# Patient Record
Sex: Female | Born: 1940
Health system: Southern US, Community
[De-identification: ages and names within clinical notes are randomized; demographics above are authoritative.]

## PROBLEM LIST (undated history)

## (undated) DIAGNOSIS — R1011 Right upper quadrant pain: Secondary | ICD-10-CM

## (undated) DIAGNOSIS — N3010 Interstitial cystitis (chronic) without hematuria: Secondary | ICD-10-CM

## (undated) DIAGNOSIS — F32A Depression, unspecified: Secondary | ICD-10-CM

## (undated) DIAGNOSIS — F419 Anxiety disorder, unspecified: Secondary | ICD-10-CM

## (undated) DIAGNOSIS — F329 Major depressive disorder, single episode, unspecified: Secondary | ICD-10-CM

## (undated) DIAGNOSIS — B029 Zoster without complications: Secondary | ICD-10-CM

## (undated) DIAGNOSIS — M7060 Trochanteric bursitis, unspecified hip: Secondary | ICD-10-CM

## (undated) DIAGNOSIS — F4320 Adjustment disorder, unspecified: Secondary | ICD-10-CM

## (undated) DIAGNOSIS — K645 Perianal venous thrombosis: Secondary | ICD-10-CM

## (undated) DIAGNOSIS — G20 Parkinson's disease: Secondary | ICD-10-CM

## (undated) DIAGNOSIS — K224 Dyskinesia of esophagus: Secondary | ICD-10-CM

## (undated) DIAGNOSIS — G20A1 Parkinson's disease without dyskinesia, without mention of fluctuations: Secondary | ICD-10-CM

## (undated) DIAGNOSIS — M797 Fibromyalgia: Secondary | ICD-10-CM

## (undated) DIAGNOSIS — N760 Acute vaginitis: Secondary | ICD-10-CM

## (undated) DIAGNOSIS — F4321 Adjustment disorder with depressed mood: Secondary | ICD-10-CM

## (undated) DIAGNOSIS — R269 Unspecified abnormalities of gait and mobility: Secondary | ICD-10-CM

## (undated) DIAGNOSIS — M3500 Sicca syndrome, unspecified: Secondary | ICD-10-CM

## (undated) DIAGNOSIS — K219 Gastro-esophageal reflux disease without esophagitis: Secondary | ICD-10-CM

## (undated) DIAGNOSIS — K589 Irritable bowel syndrome without diarrhea: Secondary | ICD-10-CM

## (undated) DIAGNOSIS — G2581 Restless legs syndrome: Secondary | ICD-10-CM

## (undated) HISTORY — DX: Acute vaginitis: N76.0

## (undated) HISTORY — DX: Adjustment disorder with depressed mood: F43.21

## (undated) HISTORY — DX: Irritable bowel syndrome, unspecified: K58.9

## (undated) HISTORY — DX: Restless legs syndrome: G25.81

## (undated) HISTORY — DX: Anxiety disorder, unspecified: F41.9

## (undated) HISTORY — DX: Adjustment disorder, unspecified: F43.20

## (undated) HISTORY — DX: Depression, unspecified: F32.A

## (undated) HISTORY — DX: Interstitial cystitis (chronic) without hematuria: N30.10

## (undated) HISTORY — DX: Trochanteric bursitis, unspecified hip: M70.60

## (undated) HISTORY — DX: Sjogren syndrome, unspecified: M35.00

## (undated) HISTORY — DX: Right upper quadrant pain: R10.11

## (undated) HISTORY — DX: Perianal venous thrombosis: K64.5

## (undated) HISTORY — DX: Unspecified abnormalities of gait and mobility: R26.9

## (undated) HISTORY — DX: Dyskinesia of esophagus: K22.4

## (undated) HISTORY — DX: Gastro-esophageal reflux disease without esophagitis: K21.9

## (undated) HISTORY — DX: Fibromyalgia: M79.7

## (undated) HISTORY — DX: Major depressive disorder, single episode, unspecified: F32.9

---
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
## 1969-09-30 HISTORY — PX: EXPLORATORY LAPAROTOMY: SUR591

## 1969-11-28 HISTORY — PX: APPENDECTOMY: SHX54

## 1972-03-30 HISTORY — PX: POLYPECTOMY: SHX149

## 1976-11-28 HISTORY — PX: NASAL SEPTUM SURGERY: SHX37

## 1978-08-30 HISTORY — PX: CYSTOCELE REPAIR: SHX163

## 1978-08-30 HISTORY — PX: HERNIA REPAIR: SHX51

## 1978-08-30 HISTORY — PX: RECTOCELE REPAIR: SHX761

## 1993-04-24 ENCOUNTER — Encounter: Payer: Self-pay | Admitting: Internal Medicine

## 1999-12-23 ENCOUNTER — Encounter (INDEPENDENT_AMBULATORY_CARE_PROVIDER_SITE_OTHER): Payer: Self-pay | Admitting: Specialist

## 1999-12-23 ENCOUNTER — Encounter: Payer: Self-pay | Admitting: Gastroenterology

## 1999-12-23 ENCOUNTER — Other Ambulatory Visit: Admission: RE | Admit: 1999-12-23 | Discharge: 1999-12-23 | Payer: Self-pay | Admitting: Gastroenterology

## 2001-01-28 HISTORY — PX: ESOPHAGOGASTRODUODENOSCOPY: SHX1529

## 2001-06-04 ENCOUNTER — Emergency Department (HOSPITAL_COMMUNITY): Admission: EM | Admit: 2001-06-04 | Discharge: 2001-06-04 | Payer: Self-pay | Admitting: Emergency Medicine

## 2002-10-29 HISTORY — PX: ABDOMINAL HYSTERECTOMY: SHX81

## 2004-07-09 ENCOUNTER — Ambulatory Visit: Payer: Self-pay | Admitting: Family Medicine

## 2004-09-10 ENCOUNTER — Ambulatory Visit: Payer: Self-pay | Admitting: Internal Medicine

## 2005-03-18 ENCOUNTER — Ambulatory Visit: Payer: Self-pay | Admitting: Internal Medicine

## 2005-03-24 ENCOUNTER — Ambulatory Visit: Payer: Self-pay | Admitting: Internal Medicine

## 2005-03-25 ENCOUNTER — Ambulatory Visit (HOSPITAL_COMMUNITY): Admission: RE | Admit: 2005-03-25 | Discharge: 2005-03-25 | Payer: Self-pay | Admitting: Internal Medicine

## 2005-03-25 ENCOUNTER — Encounter: Payer: Self-pay | Admitting: Internal Medicine

## 2005-04-13 ENCOUNTER — Ambulatory Visit: Payer: Self-pay | Admitting: Internal Medicine

## 2005-06-25 ENCOUNTER — Ambulatory Visit: Payer: Self-pay | Admitting: Internal Medicine

## 2005-09-01 ENCOUNTER — Ambulatory Visit: Payer: Self-pay | Admitting: Internal Medicine

## 2005-09-16 ENCOUNTER — Ambulatory Visit: Payer: Self-pay | Admitting: Internal Medicine

## 2006-03-18 ENCOUNTER — Ambulatory Visit: Payer: Self-pay | Admitting: Internal Medicine

## 2006-07-05 ENCOUNTER — Ambulatory Visit: Payer: Self-pay | Admitting: Internal Medicine

## 2006-09-23 ENCOUNTER — Ambulatory Visit: Payer: Self-pay | Admitting: Internal Medicine

## 2006-09-23 LAB — CONVERTED CEMR LAB
Basophils Relative: 1 % (ref 0–1)
Eosinophils Absolute: 0.1 10*3/uL (ref 0.0–0.7)
Eosinophils Relative: 1 % (ref 0–5)
Glucose, Bld: 66 mg/dL — ABNORMAL LOW (ref 70–99)
HCT: 39.3 % (ref 36.0–46.0)
Hemoglobin: 12.6 g/dL (ref 12.0–15.0)
Lymphocytes Relative: 43 % (ref 12–46)
Lymphs Abs: 2.2 10*3/uL (ref 0.7–3.3)
MCHC: 32.1 g/dL (ref 30.0–36.0)
MCV: 96.6 fL (ref 78.0–100.0)
Neutro Abs: 2.4 10*3/uL (ref 1.7–7.7)
Platelets: 288 10*3/uL (ref 150–400)
RBC: 4.07 M/uL (ref 3.87–5.11)
WBC: 5.1 10*3/uL (ref 4.0–10.5)

## 2006-09-30 HISTORY — PX: ESOPHAGOGASTRODUODENOSCOPY: SHX1529

## 2006-10-10 ENCOUNTER — Ambulatory Visit: Payer: Self-pay | Admitting: Internal Medicine

## 2006-10-13 ENCOUNTER — Ambulatory Visit: Payer: Self-pay | Admitting: Internal Medicine

## 2006-10-14 ENCOUNTER — Ambulatory Visit: Payer: Self-pay | Admitting: Internal Medicine

## 2006-10-21 ENCOUNTER — Ambulatory Visit: Payer: Self-pay | Admitting: Internal Medicine

## 2006-11-08 ENCOUNTER — Ambulatory Visit: Payer: Self-pay | Admitting: Family Medicine

## 2006-11-24 ENCOUNTER — Ambulatory Visit: Payer: Self-pay | Admitting: Family Medicine

## 2007-02-07 ENCOUNTER — Encounter: Payer: Self-pay | Admitting: Internal Medicine

## 2007-02-09 ENCOUNTER — Encounter (INDEPENDENT_AMBULATORY_CARE_PROVIDER_SITE_OTHER): Payer: Self-pay | Admitting: *Deleted

## 2007-02-16 DIAGNOSIS — F411 Generalized anxiety disorder: Secondary | ICD-10-CM | POA: Insufficient documentation

## 2007-02-16 DIAGNOSIS — M790 Rheumatism, unspecified: Secondary | ICD-10-CM | POA: Insufficient documentation

## 2007-02-16 DIAGNOSIS — F329 Major depressive disorder, single episode, unspecified: Secondary | ICD-10-CM

## 2007-02-16 DIAGNOSIS — F3289 Other specified depressive episodes: Secondary | ICD-10-CM | POA: Insufficient documentation

## 2007-02-16 DIAGNOSIS — K219 Gastro-esophageal reflux disease without esophagitis: Secondary | ICD-10-CM | POA: Insufficient documentation

## 2007-02-16 DIAGNOSIS — M797 Fibromyalgia: Secondary | ICD-10-CM

## 2007-02-16 DIAGNOSIS — N3010 Interstitial cystitis (chronic) without hematuria: Secondary | ICD-10-CM | POA: Insufficient documentation

## 2007-02-16 DIAGNOSIS — J309 Allergic rhinitis, unspecified: Secondary | ICD-10-CM | POA: Insufficient documentation

## 2007-02-16 DIAGNOSIS — M3500 Sicca syndrome, unspecified: Secondary | ICD-10-CM | POA: Insufficient documentation

## 2007-02-17 ENCOUNTER — Encounter (INDEPENDENT_AMBULATORY_CARE_PROVIDER_SITE_OTHER): Payer: Self-pay | Admitting: *Deleted

## 2007-02-19 DIAGNOSIS — G2581 Restless legs syndrome: Secondary | ICD-10-CM | POA: Insufficient documentation

## 2007-02-19 DIAGNOSIS — K589 Irritable bowel syndrome without diarrhea: Secondary | ICD-10-CM | POA: Insufficient documentation

## 2007-03-10 ENCOUNTER — Ambulatory Visit: Payer: Self-pay | Admitting: Internal Medicine

## 2007-03-10 DIAGNOSIS — R5383 Other fatigue: Secondary | ICD-10-CM

## 2007-03-10 DIAGNOSIS — R5381 Other malaise: Secondary | ICD-10-CM | POA: Insufficient documentation

## 2007-03-13 LAB — CONVERTED CEMR LAB
Albumin: 4 g/dL (ref 3.5–5.2)
Basophils Absolute: 0 10*3/uL (ref 0.0–0.1)
Basophils Relative: 1 % (ref 0–1)
CO2: 25 meq/L (ref 19–32)
Chloride: 107 meq/L (ref 96–112)
Creatinine, Ser: 0.76 mg/dL (ref 0.40–1.20)
Glucose, Bld: 77 mg/dL (ref 70–99)
HCT: 38.5 % (ref 36.0–46.0)
Lymphocytes Relative: 36 % (ref 12–46)
Lymphs Abs: 2 10*3/uL (ref 0.7–3.3)
MCV: 97.7 fL (ref 78.0–100.0)
Monocytes Relative: 8 % (ref 3–11)
Potassium: 4.7 meq/L (ref 3.5–5.3)
Sodium: 141 meq/L (ref 135–145)
Total Bilirubin: 0.5 mg/dL (ref 0.3–1.2)
WBC: 5.5 10*3/uL (ref 4.0–10.5)

## 2007-04-21 ENCOUNTER — Telehealth (INDEPENDENT_AMBULATORY_CARE_PROVIDER_SITE_OTHER): Payer: Self-pay | Admitting: *Deleted

## 2007-05-04 ENCOUNTER — Telehealth (INDEPENDENT_AMBULATORY_CARE_PROVIDER_SITE_OTHER): Payer: Self-pay | Admitting: *Deleted

## 2007-05-19 ENCOUNTER — Telehealth (INDEPENDENT_AMBULATORY_CARE_PROVIDER_SITE_OTHER): Payer: Self-pay | Admitting: *Deleted

## 2007-06-16 ENCOUNTER — Ambulatory Visit: Payer: Self-pay | Admitting: Internal Medicine

## 2007-07-03 ENCOUNTER — Telehealth (INDEPENDENT_AMBULATORY_CARE_PROVIDER_SITE_OTHER): Payer: Self-pay | Admitting: *Deleted

## 2008-03-15 ENCOUNTER — Encounter: Payer: Self-pay | Admitting: Internal Medicine

## 2008-03-18 ENCOUNTER — Encounter (INDEPENDENT_AMBULATORY_CARE_PROVIDER_SITE_OTHER): Payer: Self-pay | Admitting: *Deleted

## 2008-03-20 ENCOUNTER — Telehealth: Payer: Self-pay | Admitting: Internal Medicine

## 2008-03-22 ENCOUNTER — Ambulatory Visit: Payer: Self-pay | Admitting: Internal Medicine

## 2008-03-22 DIAGNOSIS — R198 Other specified symptoms and signs involving the digestive system and abdomen: Secondary | ICD-10-CM | POA: Insufficient documentation

## 2008-03-22 DIAGNOSIS — K645 Perianal venous thrombosis: Secondary | ICD-10-CM | POA: Insufficient documentation

## 2008-04-01 ENCOUNTER — Ambulatory Visit: Payer: Self-pay | Admitting: Internal Medicine

## 2008-04-02 ENCOUNTER — Telehealth (INDEPENDENT_AMBULATORY_CARE_PROVIDER_SITE_OTHER): Payer: Self-pay | Admitting: *Deleted

## 2008-04-15 ENCOUNTER — Telehealth: Payer: Self-pay | Admitting: Internal Medicine

## 2008-04-30 ENCOUNTER — Encounter: Payer: Self-pay | Admitting: Internal Medicine

## 2008-05-30 ENCOUNTER — Ambulatory Visit: Payer: Self-pay | Admitting: Internal Medicine

## 2008-05-30 DIAGNOSIS — F4321 Adjustment disorder with depressed mood: Secondary | ICD-10-CM | POA: Insufficient documentation

## 2008-05-30 DIAGNOSIS — N760 Acute vaginitis: Secondary | ICD-10-CM | POA: Insufficient documentation

## 2008-05-30 DIAGNOSIS — M810 Age-related osteoporosis without current pathological fracture: Secondary | ICD-10-CM | POA: Insufficient documentation

## 2008-06-21 ENCOUNTER — Ambulatory Visit: Payer: Self-pay | Admitting: Family Medicine

## 2008-06-21 DIAGNOSIS — M79609 Pain in unspecified limb: Secondary | ICD-10-CM | POA: Insufficient documentation

## 2008-06-21 DIAGNOSIS — R1011 Right upper quadrant pain: Secondary | ICD-10-CM | POA: Insufficient documentation

## 2008-06-21 DIAGNOSIS — R269 Unspecified abnormalities of gait and mobility: Secondary | ICD-10-CM | POA: Insufficient documentation

## 2008-06-21 DIAGNOSIS — M76899 Other specified enthesopathies of unspecified lower limb, excluding foot: Secondary | ICD-10-CM | POA: Insufficient documentation

## 2008-06-25 ENCOUNTER — Encounter: Payer: Self-pay | Admitting: Family Medicine

## 2008-06-25 ENCOUNTER — Encounter: Admission: RE | Admit: 2008-06-25 | Discharge: 2008-06-25 | Payer: Self-pay | Admitting: Family Medicine

## 2008-07-02 ENCOUNTER — Ambulatory Visit: Payer: Self-pay | Admitting: Family Medicine

## 2008-07-22 ENCOUNTER — Encounter: Payer: Self-pay | Admitting: Family Medicine

## 2008-07-23 ENCOUNTER — Encounter: Payer: Self-pay | Admitting: Family Medicine

## 2008-07-30 ENCOUNTER — Encounter: Payer: Self-pay | Admitting: Family Medicine

## 2008-10-01 ENCOUNTER — Encounter: Payer: Self-pay | Admitting: Cardiovascular Disease

## 2008-10-01 ENCOUNTER — Encounter: Payer: Self-pay | Admitting: Internal Medicine

## 2008-10-01 ENCOUNTER — Ambulatory Visit: Payer: Self-pay | Admitting: Adult Health Nurse Practitioner

## 2008-10-02 ENCOUNTER — Encounter: Payer: Self-pay | Admitting: Internal Medicine

## 2008-10-07 ENCOUNTER — Ambulatory Visit: Payer: Self-pay | Admitting: Cardiovascular Disease

## 2008-10-17 ENCOUNTER — Ambulatory Visit: Payer: Self-pay

## 2008-10-17 ENCOUNTER — Encounter: Payer: Self-pay | Admitting: Cardiovascular Disease

## 2008-10-28 ENCOUNTER — Ambulatory Visit: Payer: Self-pay | Admitting: Cardiovascular Disease

## 2009-02-03 DIAGNOSIS — K224 Dyskinesia of esophagus: Secondary | ICD-10-CM | POA: Insufficient documentation

## 2009-02-03 DIAGNOSIS — IMO0001 Reserved for inherently not codable concepts without codable children: Secondary | ICD-10-CM | POA: Insufficient documentation

## 2009-06-10 ENCOUNTER — Encounter: Payer: Self-pay | Admitting: Internal Medicine

## 2009-07-21 ENCOUNTER — Ambulatory Visit: Payer: Self-pay | Admitting: Internal Medicine

## 2009-07-21 DIAGNOSIS — R1313 Dysphagia, pharyngeal phase: Secondary | ICD-10-CM | POA: Insufficient documentation

## 2009-08-05 ENCOUNTER — Ambulatory Visit (HOSPITAL_COMMUNITY): Admission: RE | Admit: 2009-08-05 | Discharge: 2009-08-05 | Payer: Self-pay | Admitting: Internal Medicine

## 2009-12-22 ENCOUNTER — Encounter (INDEPENDENT_AMBULATORY_CARE_PROVIDER_SITE_OTHER): Payer: Self-pay | Admitting: *Deleted

## 2010-01-07 ENCOUNTER — Telehealth: Payer: Self-pay | Admitting: Internal Medicine

## 2010-01-16 ENCOUNTER — Ambulatory Visit: Payer: Self-pay | Admitting: *Deleted

## 2010-01-21 ENCOUNTER — Ambulatory Visit: Payer: Self-pay | Admitting: *Deleted

## 2010-09-27 LAB — CONVERTED CEMR LAB
BUN: 14 mg/dL (ref 6–23)
CO2: 29 meq/L (ref 19–32)
Glucose, Bld: 69 mg/dL — ABNORMAL LOW (ref 70–99)
Potassium: 4.3 meq/L (ref 3.5–5.3)
Sodium: 141 meq/L (ref 135–145)

## 2010-10-01 NOTE — Letter (Signed)
Summary: Colonoscopy Letter -corrected see append  [facility]  [address] [place], [place] [address]   Phone: [phone]  Fax: [phone]      December 22, 2009 MRN: [id number]   JOLEAN MADARIAGA [address] [place], [place]  [address]   Dear Ms. Cabiness,   According to your medical record, it is time for you to schedule a Colonoscopy. The American Cancer Society recommends this procedure as a method to detect early colon cancer. Patients with a family history of colon cancer, or a personal history of colon polyps or inflammatory bowel disease are at increased risk.  This letter has beeen generated based on the recommendations made at the time of your procedure. If you feel that in your particular situation this may no longer apply, please contact our office.  Please call our office at [phone] to schedule this appointment or to update your records at your earliest convenience.  Thank you for cooperating with [place] to provide you with the very best care possible.   Sincerely,  Iva Boop, M.D.  [facility] [phone]  Appended Document: Colonoscopy Letter -corrected see append This was sent in error had colonscopy 2009, due 2019

## 2010-10-01 NOTE — Progress Notes (Signed)
Summary: Triage   Phone Note Call from Patient Call back at Home Phone [phone]   Caller: Patient Call For: Dr. Leone Payor Reason for Call: Talk to Nurse Summary of Call: pt. received a recall colon letter. Her last colon was 2009....wants to know when she will be due for next colonoscopy? Initial call taken by: Karna Christmas,  Jan 07, 2010 4:06 PM  Follow-up for Phone Call        Dr Leone Payor last colon was 04/28/08.  Report indicates a 10 year recall.  Please review and advise if this letter was sent out mistakenly.   Follow-up by: Darcey Nora RN, CGRN,  Jan 07, 2010 4:13 PM  Additional Follow-up for Phone Call Additional follow up Details #1::        letter was mistakenly sent as I must not have seen EMR record please explain she is not due for colonoscopy Additional Follow-up by: Iva Boop MD, Clementeen Graham,  Jan 08, 2010 8:54 AM    Additional Follow-up for Phone Call Additional follow up Details #2::    I have left the patient a message that this was sent mistakenly.  She is due for a repeat colon 03/2018 this is correct in [facility].  I have asked her to call us for any new GI symptoms, rectal bleeding or change in bowel habits.   Follow-up by: Darcey Nora RN, CGRN,  Jan 08, 2010 10:57 AM

## 2010-12-08 ENCOUNTER — Encounter: Payer: Self-pay | Admitting: Neurology

## 2010-12-29 ENCOUNTER — Encounter: Payer: Self-pay | Admitting: Neurology

## 2011-01-12 NOTE — Assessment & Plan Note (Signed)
[facility] OFFICE NOTE   NAME:Kaitlyn Church, Kaitlyn Church                    MRN:          [id number]  DATE:10/28/2008                            DOB:          1940-12-13    PRIMARY CARE PHYSICIAN:  [name], MD   HISTORY OF PRESENT ILLNESS:  Ms. Lotspeich is a pleasant 70 year old  Caucasian female with the past medical history significant for  fibromyalgia, osteoarthritis, osteoporosis, seasonal allergies, and  esophageal spasm who is referred to my office initially on October 07, 2008, for further evaluation of atypical chest pain that is associated  with shortness of breath.  The patient had a set of cardiac enzymes  drawn in her primary care physician's office.  At that time, this showed  a CK of 158, CK-MB of 11.4, and troponin I that was less than 0.2.  Her  symptoms mostly sounded atypical.  She described her chest pain as  occurring when she attempted to swallow food.  She did not describe any  exertional chest pain.  She did note episodes of mild, sharp, stabbing  chest pain over the left chest wall that do not occur with exertion.  The symptoms lasted for 30 seconds and were not associated with  shortness of breath, palpitations, diaphoresis, nausea, or vomiting.  I  elected to perform a myocardial perfusion stress study and an  echocardiogram.  Her stress Myoview showed no evidence of myocardial  ischemia.  Her echocardiogram showed normal left ventricular size and  function with an ejection fraction of 60%.  There were no left  ventricular regional wall motion abnormalities and no significant  valvular abnormalities noted.   The patient returns today to review the results of her cardiovascular  testing.  She tells me that she has had no recurrence of the chest pain  that she had been having prior to this.  She does continue to have  occasional discomfort when she swallows food.  She tells me that the  workup for her esophageal problems was to be performed after her  cardiovascular issues were settled.  She notes no change in her  breathing pattern and denies any episodes of dizziness, near syncope,  syncope, orthopnea, PND, or lower extremity edema.  Her only real  complaint today is of stiff muscle in her left neck.  This seems to be  causing some discomfort in her neck when she moves her head.   CURRENT MEDICATIONS:  1. Potassium 99 mg once daily.  2. Zinc 50 mg once daily.  3. Garlic 500 mg once daily.  4. Vitamin D 400 units once daily.  5. Boron 3 mg once daily.  6. L-Glutamine 500 mg once daily.  7. Premarin 0.625 mg once daily.  8. Claritin once daily.  9. Xanax 0.25 mg half tablet once daily.   REVIEW OF SYSTEMS:  As stated in history of present illness is otherwise  negative.   PHYSICAL EXAMINATION:  VITAL SIGNS:  Blood pressure 99/56, pulse 70 and  regular, respirations 12 and unlabored.  GENERAL:  She is a pleasant middle-aged Caucasian female  in no acute  distress.  She is alert and oriented x3.  SKIN:  Warm and dry.  HEENT:  Normal.  There is what appears to be a spasm of her left  sternocleidomastoid muscle.  The patient has normal range of motion of  her head and neck.  NECK:  No JVD.  No carotid bruits.  No thyromegaly.  No lymphadenopathy.  LUNGS:  Clear to auscultation bilaterally without wheezes, rhonchi, or  crackles noted.  CARDIOVASCULAR:  Regular rate and rhythm without murmurs, gallops, or  rubs noted.  ABDOMEN:  Soft, nontender.  Bowel sounds are present.  EXTREMITIES:  No evidence of edema.   DIAGNOSTIC STUDIES:  1. Transthoracic echocardiogram performed on October 17, 2008, shows      normal left ventricular size and function with an ejection fraction      of 60%.  There were no left ventricular regional wall motion      abnormalities noted.  There was no pericardial effusion.  There      were no significant valvular abnormalities noted.  2.  Stress Myoview performed on October 17, 2008, with adenosine      infusion showed normal contractility and thickening in all areas of      the myocardia.  The overall left ventricular systolic function was      noted to be normal.  Ejection fraction was estimated 78%.  There      was no evidence of perfusion defects to suggest ischemia.  This was      read as a normal stress nuclear study.   ASSESSMENT AND PLAN:  This is a pleasant 70 year old Caucasian female  who was referred for atypical chest pain and had no evidence of  myocardial ischemia on her stress test.  Her echocardiogram is not  suggestive of any structural cardiac abnormalities.  I think that her  chest pain is most likely noncardiac in etiology.  I have encouraged the  patient to continue to follow up with her primary care physician for  possible gastrointestinal sources of her discomfort.  It is also likely  that the patient has a musculoskeletal component of her abdominal, chest  wall, and upper back pain.  We will follow her on an as-needed basis in  our office.  I will not make any medication changes today.     Verne Carrow, MD  Electronically Signed    CM/MedQ  DD: 10/28/2008  DT: 10/29/2008  Job #: [id number]   cc:   [name], MD

## 2011-01-12 NOTE — Assessment & Plan Note (Signed)
[facility] OFFICE NOTE   NAME:Kaitlyn Church                    MRN:          [id number]  DATE:10/07/2008                            DOB:          10/23/1940    PRIMARY CARE PHYSICIAN:  [name], MD   REASON FOR REFERRAL:  The patient with complaints of chest pain and  shortness of breath.   HISTORY OF PRESENT ILLNESS:  Kaitlyn Church is a pleasant 70 year old  Caucasian female with a past medical history significant for  fibromyalgia, osteoarthritis, osteoporosis, seasonal allergies, and  esophageal spasm who is referred today for further evaluation of  atypical chest pain with associated shortness of breath.  The patient  tells me that she has had chronic problems with chest heaviness.  The  most significant chest pain occurs after she attempts to swallow food.  She has been diagnosed in the past with esophageal spasm, but currently  is not being treated for this.  She tells me that lately when she  swallows food, she can feel her esophagus spasm around the food.  She  does not describe any exertional chest pain.  She does note several  episodes of mild sharp stabbing chest pain over the left chest wall that  have not occurred with exertion.  The symptoms usually last for 30  seconds and are not associated with increased shortness of breath,  palpitations, diaphoresis, nausea, or vomiting.  Overall, she tells me  that she feels that she has shortness of breath all the time.  This is  to the point that she feels that she becomes more short of breath when  she walks a minimal distance or even sometimes when she talks.  She  reports lower extremity swelling over the last few months as well.  She  denies any awareness of palpitations or any episodes of dizziness, near  syncope, syncope, orthopnea, or PND.  It appears that she had laboratory  values drawn in [name] office and her CK-MB came back elevated.   Her  overall CK level was within the normal range, but the CK-MB was 11.4.  A  troponin checked at the same time was less than 0.2, which on this  laboratory scale is within the normal range.   PAST MEDICAL HISTORY:  1. Fibromyalgia.  2. Osteoarthritis.  3. Osteoporosis.  4. Seasonal allergies.  5. Esophageal spasm.   PAST SURGICAL HISTORY:  1. Tonsillectomy.  2. Exploratory laparoscopy.  3. Appendectomy.  4. Removal of bladder polyps.  5. Hysterectomy.  6. Repair of the deviated nasal septum.  7. Hernia repair.  8. Cystocele and rectocele repair.   ALLERGIES:  DARVON, DEMEROL, MORPHINE.   CURRENT MEDICATIONS:  1. Potassium 99 mg once daily.  2. Zinc 50 mg once daily.  3. Garlic 500 mg once daily.  4. Vitamin D 400 units once daily.  5. Boron 3 mg once daily.  6. L-glutamine 500 mg once daily.  7. Premarin 0.625 mg once daily.   ALLERGIES:  1. RELIEF one-half tablet once daily.  2. ALPRAZOLAM 0.25 mg half tablet once daily.  SOCIAL HISTORY:  The patient denies use of tobacco, alcohol, or illicit  drugs.  She is a widow and works as a housewife.  She has 2 children.   FAMILY HISTORY:  The patient's mother died from breast cancer and her  father died from a pneumonia.  She does have an uncle who had a  myocardial infarction.  She has 1 sister who is alive with Alzheimer  disease and 1 brother who is alive who is paraplegic.  There is no  family history of premature coronary artery disease.   REVIEW OF SYSTEMS:  As stated in the history of present illness is  otherwise negative.   PHYSICAL EXAMINATION:  VITAL SIGNS:  Blood pressure 102/60, pulse 72 and  regular, respirations 12 and unlabored.  GENERAL:  She is a pleasant elderly Caucasian female in no acute  distress.  She is alert and oriented x3.  PSYCHIATRIC:  Mood and affect are appropriate.  NEUROLOGIC:  No focal neurological deficits.  MUSCULOSKELETAL:  Muscle strength and tone is normal.  SKIN:  Warm and  dry.  HEENT:  Normal.  The patient is wearing goggles as she tells me she has  dry eyes.  NECK:  No JVD.  No carotid bruits.  No thyromegaly.  No lymphadenopathy.  LUNGS:  Clear to auscultation bilaterally without wheezes, rhonchi, or  crackles noted.  CARDIOVASCULAR:  Regular rate and rhythm without murmurs, gallops, or  rubs noted.  ABDOMEN:  Soft, nontender, and nondistended.  Bowel sounds are present.  EXTREMITIES:  No evidence of lower extremity edema currently.  Pulses  are 2+ in all extremities.   DIAGNOSTIC STUDIES:  1. Laboratory values from the office of Dr. [name] shows a CK of 158, CK-      MB 11.4, troponin-I less than 0.2.  2. A 12-lead EKG obtained on October 01, 2008 in an outside office      shows normal sinus rhythm with poor R-wave progression throughout      the precordial leads.  There are no signs of active ischemia.  The      QT interval is corrected at 370 milliseconds.  The PR intervals are      112 milliseconds.  The QRS duration is 72 milliseconds.   ASSESSMENT AND PLAN:  This is a pleasant 70 year old Caucasian female  with no prior cardiac history and no significant risk factors for  coronary artery disease who presents with complaints of atypical chest  pain as well as baseline shortness of breath and complaints of lower  extremity swelling, although none is present on exam today.  I do not  think that her chest pain sounds consistent with a cardiac etiology.  It  sounds like it is most likely related to a gastrointestinal cause.  The  patient does, however, have an elevated CK-MB level in the setting of a  normal total CK level and a normal troponin.  I think it would be  worthwhile especially with her shortness of breath to perform an  echocardiogram to assess her left ventricular systolic function and to  look for a left ventricular regional wall motion abnormalities.  I would  also like to perform an adenosine Myoview stress test to rule out any   coronary ischemia.  I will not make any medication changes at the  current time.  I will plan on seeing the patient back in my office in 3  weeks to review the results of her testing.  The patient is aware that  she should seek immediate medical attention if she has any abrupt change  in her clinical status.     Verne Carrow, MD  Electronically Signed    CM/MedQ  DD: 10/07/2008  DT: 10/08/2008  Job #: [id number]   cc:   [name], MD

## 2011-01-15 NOTE — Assessment & Plan Note (Signed)
[facility]                         GASTROENTEROLOGY OFFICE NOTE   NAME:Weinman, CENDY OCONNOR                    MRN:          [id number]  DATE:10/10/2006                            DOB:          23-Aug-1941    CHIEF COMPLAINT:  Esophageal spasms, hoarseness.   ASSESSMENT:  A 70 year old white woman with known history of esophageal  dysmotility. She has a dry mouth syndrome that is not Sjogren's (has had  rheumatology evaluation). She has had problems lately with worsening  hoarseness after talking for 2 or 3 hours straight a few weeks ago.  Subsequently, she had bronchitis or an upper respiratory infection and  took a Z-pak. She is continuing to complain of an aching chest pain that  she relates to her esophagus, that was improved by use of valium.   I think she could have esophageal dysmotility related problems, though I  would expect more severe chest pain. She could have reflux problems. She  is tender on her chest wall and I think some of this is musculoskeletal  chest pain in the setting of fibromyalgia. The hoarseness could be due  to some sort of laryngeal strain as opposed to reflux or both.   The patient also suffers from chronic constipation, which is improved on  MiraLax. She had a colonoscopy in 2001, without significant findings.   PLAN:  1. After discussing this, we will go ahead with an upper GI endoscopy,      the last one was in 2002. If she did have changes of reflux      esophagitis I think that could help [place]. The unfortunate thing is      that she has sensitivities to almost all H2 blockers and PPIs I      think. Her therapy is quite limited due to that.  2. She can continue to use moist heat, perhaps a topical NSAID would      be useful as Tylenol and NSAIDs caused problems with making her      feel hyper. She recently took a baby aspirin daily for three days      and developed mouth ulcers. Therapy is significantly limited by  her      medication sensitivities.  3. Continue the MiraLax, screening colonoscopy in 2011.   Also, I think returning to an Ear, Nose and Throat physician for  examination of the larynx could prove useful. It does not sound like she  had a flexible laryngoscopy. I think she could have a laryngeal problem  as opposed to an esophageal problem versus both. She had seen a Dr.  Willeen Cass, but prefers to go see Dr. Elenore Rota, who has moved from  [facility] to [facility] and she said she will do that.   HISTORY:  Ms. Winger returns at the request of Dr. Alphonsus Sias for  evaluation because of complaints of hoarseness and difficulty speaking,  which she says has worsened. On January 9th, she talked for about 2 or 3  hours and after that she had a strained, hoarse voice, she had burning  in her esophagus she tells and subsequently on to have an  infection. She  ended up getting a Z-pak from a Dr. Willeen Cass who is an Actuary  in [place]. Eventually, she recovered from the upper respiratory  infection, which did involve a lot of coughing and production of mucus  and sinus drainage, etc... She had seen Dr. Alphonsus Sias recently and was  given MiraLax for constipation and she is now moving her bowels about 5  days out of 7 days, which is better than when she would skip 2 or 3  days. For this chest discomfort, described as a burning ache, that is  constant, she has tried some diazepam, which did provide benefit, but  after 3 days she becomes sort of weak and dizzy and says her blood  pressure is low after taking that. Aspirin use as above. She also  becomes hyper after taking diazepam for a while. She uses moist heat for  her fibromyalgia. She thinks her fibromyalgia is overall stable.   MEDICATIONS:  1. Estrace 10 mg daily, half tablet.  2. Fluoxetine 5 mg daily.  3. Loratadine 5 mg daily.  4. Baridium daily for bladder spasms.  5. Muro ointment daily and at bedtime.  6. Diazepam 5 mg as needed.  7.  MiraLax p.r.n.  8. Capsaicin cream p.r.n.   ALLERGIES:  SHE IS ALLERGIC OR SENSITIVE TO:  1. DARVON.  2. DEMEROL.  3. MORPHINE.  4. PROTON PUMP INHIBITORS.  5. MEDICATIONS AS MENTIONED ABOVE.   WE HAVE HER AS SENSITIVE OR ALLERGIC TO:  1. PENICILLIN.  2. SEPTRA.  3. KEFLEX.  4. CELEBREX AS WELL.   PAST MEDICAL HISTORY:  1. Fibromyalgia.  2. Irritable bowel syndrome.  3. Dry mouth, dry eyes syndrome, not Sjogren's.  4. Restless leg syndrome.  5. Allergies.  6. Anxiety.  7. Depression.  8. Clinical diagnosis of gastroesophageal reflux disease.  9. Previous tonsillectomy.  10.Random colon biopsies at colonoscopy in 2001, negative.  11.She has hemorrhoids.  12.Bladder polyps.  13.Previous hysterectomy.  14.Deviated septum repair.  15.Hernia, cystocele, rectocele repair.  16.Exploratory laparotomy in the past.  17.Esophageal dysmotility known from previous barium swallow in 2006.   SOCIAL HISTORY:  She does not smoke, does not drink.   REVIEW OF SYSTEMS:  She has had a history of Raynaud's in the past. She  has not described fever or chills. She says her fibromyalgia is stable.  Mouth ulcers with aspirin as described above.   PHYSICAL EXAMINATION:  Thin, elderly, white woman. Weight is 126 pounds,  which is stable. Pulse is 68, blood pressure 92/52.  EYES: Anicteric.  NECK: Is supple without mass, thyromegaly. The neck is nontender.  LUNGS: Are clear.  HEART: S1, S2. No murmurs, rubs or gallops.  ABDOMEN: Is soft. Nontender.  LOWER EXTREMITIES: No edema.  NEURO/PSYCH: She is alert and oriented x3.   I appreciate the opportunity to care for this patient. I have reviewed  the recent office notes, problem list sent by Dr. Alphonsus Sias.     Iva Boop, MD,FACG  Electronically Signed    CEG/MedQ  DD: 10/10/2006  DT: 10/10/2006  Job #: [id number]   cc:   Karie Schwalbe, MD  Vernie Murders

## 2011-01-29 ENCOUNTER — Encounter: Payer: Self-pay | Admitting: Neurology

## 2011-02-15 ENCOUNTER — Encounter: Payer: Self-pay | Admitting: Cardiovascular Disease

## 2011-02-28 ENCOUNTER — Encounter: Payer: Self-pay | Admitting: Neurology

## 2011-03-08 ENCOUNTER — Encounter: Payer: Self-pay | Admitting: Cardiovascular Disease

## 2012-01-04 ENCOUNTER — Ambulatory Visit: Payer: Self-pay | Admitting: Internal Medicine

## 2012-03-06 DIAGNOSIS — H01006 Unspecified blepharitis left eye, unspecified eyelid: Secondary | ICD-10-CM | POA: Insufficient documentation

## 2012-03-06 DIAGNOSIS — H04123 Dry eye syndrome of bilateral lacrimal glands: Secondary | ICD-10-CM | POA: Insufficient documentation

## 2012-03-06 DIAGNOSIS — H01003 Unspecified blepharitis right eye, unspecified eyelid: Secondary | ICD-10-CM | POA: Insufficient documentation

## 2012-03-06 DIAGNOSIS — H02889 Meibomian gland dysfunction of unspecified eye, unspecified eyelid: Secondary | ICD-10-CM | POA: Insufficient documentation

## 2013-04-20 DIAGNOSIS — H0019 Chalazion unspecified eye, unspecified eyelid: Secondary | ICD-10-CM | POA: Insufficient documentation

## 2014-04-27 ENCOUNTER — Encounter: Payer: Self-pay | Admitting: Internal Medicine

## 2014-06-19 ENCOUNTER — Ambulatory Visit: Payer: Self-pay | Admitting: Podiatry

## 2014-06-26 ENCOUNTER — Encounter: Payer: Self-pay | Admitting: Podiatry

## 2014-06-26 ENCOUNTER — Ambulatory Visit (INDEPENDENT_AMBULATORY_CARE_PROVIDER_SITE_OTHER): Payer: Medicare Other | Admitting: Podiatry

## 2014-06-26 VITALS — Ht 65.0 in | Wt 116.0 lb

## 2014-06-26 DIAGNOSIS — M79609 Pain in unspecified limb: Secondary | ICD-10-CM

## 2014-06-26 DIAGNOSIS — B351 Tinea unguium: Secondary | ICD-10-CM

## 2014-06-26 NOTE — Progress Notes (Signed)
She presents today chief complaint of ankle toenails hallux and second bilateral. Recent changes in her health include Parkinson's disease which has taken from her the ability to speak loudly and is resulting in spasms about the dorsal aspect of her bilateral foot. ° °Objective: Vital signs are stable she is alert and oriented ×3 pulses are palpable bilateral. She has mild extensors at the first metatarsophalangeal joint bilateral. Otherwise the remainder of the toes appear to be good. She has sharp radial nail margins with thickening of the hallux bilaterally and of the second digit bilaterally this is resulting from hammertoe deformities and hallux extensors. ° °Assessment: Pain in limb secondary to nail dystrophy bilateral. ° °Plan: Debridement of nails bilateral. °

## 2014-07-31 ENCOUNTER — Ambulatory Visit: Payer: Self-pay | Admitting: Internal Medicine

## 2014-08-28 ENCOUNTER — Ambulatory Visit: Payer: Medicare Other | Admitting: Podiatry

## 2014-09-02 ENCOUNTER — Ambulatory Visit: Payer: Medicare Other

## 2014-09-02 ENCOUNTER — Ambulatory Visit: Payer: Medicare Other | Admitting: Podiatry

## 2014-09-09 ENCOUNTER — Ambulatory Visit (INDEPENDENT_AMBULATORY_CARE_PROVIDER_SITE_OTHER): Payer: Medicare PPO | Admitting: Podiatry

## 2014-09-09 DIAGNOSIS — M79609 Pain in unspecified limb: Secondary | ICD-10-CM

## 2014-09-09 DIAGNOSIS — B351 Tinea unguium: Secondary | ICD-10-CM

## 2014-09-09 NOTE — Progress Notes (Signed)
She presents today chief complaint of ankle toenails hallux and second bilateral. Recent changes in her health include Parkinson's disease which has taken from her the ability to speak loudly and is resulting in spasms about the dorsal aspect of her bilateral foot.  Objective: Vital signs are stable she is alert and oriented 3 pulses are palpable bilateral. She has mild extensors at the first metatarsophalangeal joint bilateral. Otherwise the remainder of the toes appear to be good. She has sharp radial nail margins with thickening of the hallux bilaterally and of the second digit bilaterally this is resulting from hammertoe deformities and hallux extensors.  Assessment: Pain in limb secondary to nail dystrophy bilateral.  Plan: Debridement of nails bilateral.

## 2014-09-24 DIAGNOSIS — K5909 Other constipation: Secondary | ICD-10-CM | POA: Insufficient documentation

## 2014-09-24 DIAGNOSIS — R1310 Dysphagia, unspecified: Secondary | ICD-10-CM | POA: Insufficient documentation

## 2014-09-24 DIAGNOSIS — I7300 Raynaud's syndrome without gangrene: Secondary | ICD-10-CM | POA: Insufficient documentation

## 2014-11-14 ENCOUNTER — Telehealth: Payer: Self-pay | Admitting: *Deleted

## 2014-11-14 NOTE — Telephone Encounter (Signed)
Spoke with Marzella Schlein, pts designated release, she states pt is no longer a pt of [facility].  She further states pt has flu injection at her PCP's office.

## 2014-12-16 ENCOUNTER — Ambulatory Visit: Payer: Medicare Other

## 2014-12-16 ENCOUNTER — Ambulatory Visit (INDEPENDENT_AMBULATORY_CARE_PROVIDER_SITE_OTHER): Payer: Medicare PPO | Admitting: Podiatry

## 2014-12-16 DIAGNOSIS — B351 Tinea unguium: Secondary | ICD-10-CM

## 2014-12-16 DIAGNOSIS — M79609 Pain in unspecified limb: Secondary | ICD-10-CM | POA: Diagnosis not present

## 2014-12-16 NOTE — Progress Notes (Signed)
Patient presents to the office today with a chief complaint of painful elongated toenails.  Objective: Pulses are palpable bilateral. Nails are thick yellow dystrophic clinically mycotic and painful palpation.  Assessment: Pain in limb secondary to onychomycosis 1 through 5 bilateral.  Plan: Debridement of nails 1 through 5 bilateral covered service secondary to pain.  

## 2015-04-07 ENCOUNTER — Ambulatory Visit (INDEPENDENT_AMBULATORY_CARE_PROVIDER_SITE_OTHER): Payer: Medicare PPO | Admitting: Podiatry

## 2015-04-07 DIAGNOSIS — M79609 Pain in unspecified limb: Secondary | ICD-10-CM

## 2015-04-07 DIAGNOSIS — M79676 Pain in unspecified toe(s): Secondary | ICD-10-CM | POA: Diagnosis not present

## 2015-04-07 DIAGNOSIS — B351 Tinea unguium: Secondary | ICD-10-CM

## 2015-04-07 NOTE — Progress Notes (Signed)
Patient presents to the office today with a chief complaint of painful elongated toenails.  Objective: Pulses are palpable bilateral. Nails are thick yellow dystrophic clinically mycotic and painful palpation.  Assessment: Pain in limb secondary to onychomycosis 1 through 5 bilateral.  Plan: Debridement of nails 1 through 5 bilateral covered service secondary to pain. 3 mo.

## 2015-08-04 ENCOUNTER — Ambulatory Visit: Payer: Medicare PPO | Admitting: Podiatry

## 2015-08-31 DIAGNOSIS — G20 Parkinson's disease: Secondary | ICD-10-CM | POA: Diagnosis not present

## 2015-09-17 DIAGNOSIS — E538 Deficiency of other specified B group vitamins: Secondary | ICD-10-CM | POA: Diagnosis not present

## 2015-10-01 DIAGNOSIS — G20 Parkinson's disease: Secondary | ICD-10-CM | POA: Diagnosis not present

## 2015-10-06 DIAGNOSIS — N390 Urinary tract infection, site not specified: Secondary | ICD-10-CM | POA: Diagnosis not present

## 2015-10-22 DIAGNOSIS — E538 Deficiency of other specified B group vitamins: Secondary | ICD-10-CM | POA: Diagnosis not present

## 2015-10-29 DIAGNOSIS — G20 Parkinson's disease: Secondary | ICD-10-CM | POA: Diagnosis not present

## 2015-11-01 ENCOUNTER — Emergency Department
Admission: EM | Admit: 2015-11-01 | Discharge: 2015-11-01 | Disposition: A | Discharge status: Home / Self Care | Payer: PPO | Attending: Emergency Medicine | Admitting: Emergency Medicine

## 2015-11-01 ENCOUNTER — Emergency Department: Payer: PPO

## 2015-11-01 ENCOUNTER — Encounter: Payer: Self-pay | Admitting: Emergency Medicine

## 2015-11-01 DIAGNOSIS — I10 Essential (primary) hypertension: Secondary | ICD-10-CM | POA: Diagnosis not present

## 2015-11-01 DIAGNOSIS — Z880 Allergy status to penicillin: Secondary | ICD-10-CM | POA: Insufficient documentation

## 2015-11-01 DIAGNOSIS — S0993XA Unspecified injury of face, initial encounter: Secondary | ICD-10-CM | POA: Diagnosis not present

## 2015-11-01 DIAGNOSIS — S0033XA Contusion of nose, initial encounter: Secondary | ICD-10-CM

## 2015-11-01 DIAGNOSIS — S199XXA Unspecified injury of neck, initial encounter: Secondary | ICD-10-CM | POA: Insufficient documentation

## 2015-11-01 DIAGNOSIS — Y9389 Activity, other specified: Secondary | ICD-10-CM | POA: Diagnosis not present

## 2015-11-01 DIAGNOSIS — S0992XA Unspecified injury of nose, initial encounter: Secondary | ICD-10-CM | POA: Diagnosis not present

## 2015-11-01 DIAGNOSIS — N390 Urinary tract infection, site not specified: Secondary | ICD-10-CM

## 2015-11-01 DIAGNOSIS — W19XXXA Unspecified fall, initial encounter: Secondary | ICD-10-CM | POA: Diagnosis not present

## 2015-11-01 DIAGNOSIS — Y998 Other external cause status: Secondary | ICD-10-CM | POA: Insufficient documentation

## 2015-11-01 DIAGNOSIS — Z87891 Personal history of nicotine dependence: Secondary | ICD-10-CM | POA: Diagnosis not present

## 2015-11-01 DIAGNOSIS — Z79899 Other long term (current) drug therapy: Secondary | ICD-10-CM | POA: Insufficient documentation

## 2015-11-01 DIAGNOSIS — W01198A Fall on same level from slipping, tripping and stumbling with subsequent striking against other object, initial encounter: Secondary | ICD-10-CM | POA: Diagnosis not present

## 2015-11-01 DIAGNOSIS — Y92002 Bathroom of unspecified non-institutional (private) residence single-family (private) house as the place of occurrence of the external cause: Secondary | ICD-10-CM | POA: Diagnosis not present

## 2015-11-01 DIAGNOSIS — M6281 Muscle weakness (generalized): Secondary | ICD-10-CM | POA: Diagnosis not present

## 2015-11-01 LAB — URINALYSIS COMPLETE WITH MICROSCOPIC (ARMC ONLY)
BACTERIA UA: NONE SEEN
Specific Gravity, Urine: 1.018 (ref 1.005–1.030)

## 2015-11-01 LAB — COMPREHENSIVE METABOLIC PANEL
ALBUMIN: 3.6 g/dL (ref 3.5–5.0)
ALK PHOS: 62 U/L (ref 38–126)
AST: 19 U/L (ref 15–41)
Anion gap: 4 — ABNORMAL LOW (ref 5–15)
BUN: 18 mg/dL (ref 6–20)
CHLORIDE: 103 mmol/L (ref 101–111)
CO2: 29 mmol/L (ref 22–32)
CREATININE: 0.52 mg/dL (ref 0.44–1.00)
Calcium: 8.8 mg/dL — ABNORMAL LOW (ref 8.9–10.3)
GFR calc non Af Amer: >60 mL/min (ref >60)
GLUCOSE: 139 mg/dL — AB (ref 65–99)
Potassium: 3.7 mmol/L (ref 3.5–5.1)
Sodium: 136 mmol/L (ref 135–145)
Total Bilirubin: 0.7 mg/dL (ref 0.3–1.2)
Total Protein: 5.6 g/dL — ABNORMAL LOW (ref 6.5–8.1)

## 2015-11-01 LAB — CBC
HEMATOCRIT: 36.7 % (ref 35.0–47.0)
Hemoglobin: 12 g/dL (ref 12.0–16.0)
MCH: 30.2 pg (ref 26.0–34.0)
MCHC: 32.7 g/dL (ref 32.0–36.0)
MCV: 92.3 fL (ref 80.0–100.0)
Platelets: 235 10*3/uL (ref 150–440)
RBC: 3.97 MIL/uL (ref 3.80–5.20)
RDW: 12.6 % (ref 11.5–14.5)
WBC: 4.2 10*3/uL (ref 3.6–11.0)

## 2015-11-01 LAB — TROPONIN I: Troponin I: <0.03 ng/mL (ref <0.031)

## 2015-11-01 MED ORDER — NITROFURANTOIN MONOHYD MACRO 100 MG PO CAPS: 100.0000 mg | ORAL_CAPSULE | Freq: Two times a day (BID) | ORAL | Status: AC

## 2015-11-01 NOTE — ED Notes (Signed)
Patient from [facility] today with increased falls. Patient fell yesterday, this morning, and fell asleep on the toilet this afternoon and fell over to the side hitting her left cheek and nose. Patient denies any other pain at this time

## 2015-11-01 NOTE — ED Provider Notes (Signed)
[facility] Emergency Department Provider Note  ____________________________________________    I have reviewed the triage vital signs and the nursing notes.   HISTORY  Chief Complaint Fall    HPI Kaitlyn Church is a 75 y.o. female who presents after a fall. Reportedly patient fell sleep sitting on toilet and fell to the left side and hit her nose and face on the wall. She complains of mild neck pain but primarily of nose pain. She denies shortness of breath or chest pain. No dizziness or abdominal pain. Apparently has a history of cystitis     Past Medical History  Diagnosis Date  . Fibromyalgia   . Esophageal motility disorder   . Abdominal pain, right upper quadrant   . Trochanteric bursitis     bilateral  . Gait disturbance   . Grief reaction   . Vaginitis   . Osteoporosis   . Hemorrhoids, internal, thrombosed   . Restless leg syndrome   . Irritable bowel syndrome   . Sjogren's syndrome ([place])     ?  Marland Kitchen Interstitial cystitis   . GERD (gastroesophageal reflux disease)   . Depression   . Anxiety   . Allergic rhinitis     Patient Active Problem List   Diagnosis Date Noted  . DYSPHAGIA PHARYNGEAL PHASE 07/21/2009  . ESOPHAGEAL MOTILITY DISORDER 02/03/2009  . FIBROMYALGIA 02/03/2009  . TROCHANTERIC BURSITIS, BILATERAL 06/21/2008  . HAND PAIN 06/21/2008  . GAIT DISTURBANCE 06/21/2008  . ABDOMINAL, RIGHT UPPER, PAIN 06/21/2008  . GRIEF REACTION 05/30/2008  . VAGINITIS 05/30/2008  . OSTEOPOROSIS 05/30/2008  . HEMORRHOIDS, INTERNAL, THROMBOSED 03/22/2008  . CHANGE IN BOWELS 03/22/2008  . FATIGUE 03/10/2007  . RESTLESS LEG SYNDROME 02/19/2007  . IRRITABLE BOWEL SYNDROME 02/19/2007  . ANXIETY 02/16/2007  . DEPRESSION 02/16/2007  . ALLERGIC RHINITIS 02/16/2007  . GERD 02/16/2007  . INTERSTITIAL CYSTITIS 02/16/2007  . [place] SYNDROME 02/16/2007  . FIBROSITIS 02/16/2007    Past Surgical History  Procedure Laterality Date  .  Appendectomy  11/1969  . Exploratory laparotomy  09/1969  . Abdominal hysterectomy  10/2002  . Tonsillectomy  1948  . Esophagogastroduodenoscopy  01/2001    nl  . Nasal septum surgery  11/1976  . Cystocele repair  1980  . Hernia repair  1980  . Rectocele repair  1980  . Polypectomy  03/1972    Bladder  . Esophagogastroduodenoscopy  09/2006    Normal Carlean Purl)    Current Outpatient Rx  Name  Route  Sig  Dispense  Refill  . ALPRAZolam (XANAX) 0.25 MG tablet   Oral   Take 0.125 mg by mouth daily.           . carbidopa-levodopa (SINEMET) 25-100 MG per tablet   Oral   Take 1 tablet by mouth 3 (three) times daily.         Marland Kitchen estrogens, conjugated, (PREMARIN) 0.625 MG tablet   Oral   Take 0.625 mg by mouth daily. Take daily for 21 days then do not take for 7 days.          Marland Kitchen FLUoxetine (PROZAC) 10 MG tablet   Oral   Take 5 mg by mouth daily.           . hydrocortisone (PROCTOSOL HC) 2.5 % rectal cream   Rectal   Place rectally as needed.           . hydrocortisone 1 % cream   Topical   Apply 1 application topically 2 (two) times  daily.           . mometasone (ELOCON) 0.1 % cream   Topical   Apply 1 application topically 2 (two) times daily as needed.           . phenazopyridine (PYRIDIUM) 95 MG tablet   Oral   Take 95 mg by mouth 3 (three) times daily. After each meal            Allergies Celecoxib; Cephalexin; Meperidine hcl; Morphine sulfate; Pantoprazole sodium; Penicillins; Propoxyphene hcl; and Sulfamethoxazole-trimethoprim  Family History  Problem Relation Age of Onset  . Breast cancer Mother   . Alcohol abuse Father   . Pneumonia Father   . Coronary artery disease Neg Hx   . Diabetes Neg Hx   . Hypertension Neg Hx   . Colon cancer Neg Hx   . Bipolar disorder Son     Social History Social History  Substance Use Topics  . Smoking status: Former Research scientist (life sciences)  . Smokeless tobacco: None  . Alcohol Use: No    Review of  Systems  Constitutional: Negative for fever. Negative for dizziness Eyes: Negative for visual changes. ENT: Negative for sore throat Cardiovascular: Negative for chest pain. Respiratory: Negative for shortness of breath. Gastrointestinal: Negative for abdominal pain Genitourinary: Negative for dysuria. Musculoskeletal: Negative for back pain. Positive for neck pain, negative for hip pain Skin: Negative for abrasion or laceration Neurological: Negative for focal weakness Psychiatric: No anxiety    ____________________________________________   PHYSICAL EXAM:  VITAL SIGNS: ED Triage Vitals  Enc Vitals Group     BP 11/01/15 1522 132/76 mmHg     Pulse Rate 11/01/15 1522 75     Resp 11/01/15 1522 16     Temp 11/01/15 1525 97.4 F (36.3 C)     Temp Source 11/01/15 1525 Oral     SpO2 11/01/15 1522 96 %     Weight 11/01/15 1522 104 lb (47.174 kg)     Height 11/01/15 1522 5\' 5"  (1.651 m)     Head Cir --      Peak Flow --      Pain Score 11/01/15 1516 7     Pain Loc --      Pain Edu? --      Excl. in [place]? --      Constitutional: AlertWell appearing and in no distress. Eyes: Conjunctivae are normal.  ENT   Head: Normocephalic, swelling and bruising to the bridge of the nose    Mouth/Throat: Mucous membranes are moist. Cardiovascular: Normal rate, regular rhythm. Normal and symmetric distal pulses are present in all extremities.  Respiratory: Normal respiratory effort without tachypnea nor retractions. Breath sounds are clear and equal bilaterally.  Gastrointestinal: Soft and non-tender in all quadrants. No distention. There is no CVA tenderness. Genitourinary: deferred Musculoskeletal: Nontender with normal range of motion in all extremities. No lower extremity tenderness nor edema. No pain with axial load, no pelvic tenderness to palpation. No vertebral tenderness to palpation Neurologic:  Normal speech and language. No gross focal neurologic deficits are  appreciated. Skin:  Skin is warm, dry and intact. No rash noted. Psychiatric: Mood and affect are normal. Patient exhibits appropriate insight and judgment.  ____________________________________________    LABS (pertinent positives/negatives)  Labs Reviewed  CBC  COMPREHENSIVE METABOLIC PANEL  TROPONIN I  URINALYSIS COMPLETEWITH MICROSCOPIC ([place])    ____________________________________________   EKG  ED ECG REPORT I, Lavonia Drafts, the attending physician, personally viewed and interpreted this ECG.   Date: 11/01/2015  EKG Time: 3:24 PM  Rate: 84  Rhythm: normal sinus rhythm  Axis: Normal axis  Intervals:none  ST&T Change: None specific changes   ____________________________________________    RADIOLOGY I have personally reviewed any xrays that were ordered on this patient: CT maxillofacial and cervical spine show no acute fractures  ____________________________________________   PROCEDURES  Procedure(s) performed: none  Critical Care performed:none  ____________________________________________   INITIAL IMPRESSION / ASSESSMENT AND PLAN / ED COURSE  Pertinent labs & imaging results that were available during my care of the patient were reviewed by me and considered in my medical decision making (see chart for details).  Patient presents after a fall. She is well-appearing her vital signs are unremarkable. She does have bruising and swelling to her nose and left cheek. We will obtain imaging labs and urine and reevaluate  Images are reassuring. Urinalysis shows many white blood cells. We will treat with Macrobid and have the patient follow-up with her PCP ____________________________________________   FINAL CLINICAL IMPRESSION(S) / ED DIAGNOSES  Final diagnoses:  Fall with injury  Contusion, nose, initial encounter  UTI (lower urinary tract infection)     Lavonia Drafts, MD 11/01/15 2127

## 2015-11-01 NOTE — ED Notes (Signed)
Patient brought in by [facility] from [place]. Patient was sitting on the toilet and fell asleep. Patient fell to the side and hit her head.

## 2015-11-01 NOTE — ED Notes (Signed)
Spoke with Vaughan Basta, med tech at [facility] and informed her that patient is being sent back with RX for [employer]. RX will be given to patients daughter to take to local pharmacy to have filled so that she is able to get medication over the weekend. Dr. Corky Downs wrote order for [facility] to administer RX to patient.

## 2015-11-01 NOTE — Discharge Instructions (Signed)
Contusion °A contusion is a deep bruise. Contusions are the result of a blunt injury to tissues and muscle fibers under the skin. The injury causes bleeding under the skin. The skin overlying the contusion may turn blue, purple, or yellow. Minor injuries will give you a painless contusion, but more severe contusions may stay painful and swollen for a few weeks.  °CAUSES  °This condition is usually caused by a blow, trauma, or direct force to an area of the body. °SYMPTOMS  °Symptoms of this condition include: °· Swelling of the injured area. °· Pain and tenderness in the injured area. °· Discoloration. The area may have redness and then turn blue, purple, or yellow. °DIAGNOSIS  °This condition is diagnosed based on a physical exam and medical history. An X-ray, CT scan, or MRI may be needed to determine if there are any associated injuries, such as broken bones (fractures). °TREATMENT  °Specific treatment for this condition depends on what area of the body was injured. In general, the best treatment for a contusion is resting, icing, applying pressure to (compression), and elevating the injured area. This is often called the RICE strategy. Over-the-counter anti-inflammatory medicines may also be recommended for pain control.  °HOME CARE INSTRUCTIONS  °· Rest the injured area. °· If directed, apply ice to the injured area: °¨ Put ice in a plastic bag. °¨ Place a towel between your skin and the bag. °¨ Leave the ice on for 20 minutes, 2-3 times per day. °· If directed, apply light compression to the injured area using an elastic bandage. Make sure the bandage is not wrapped too tightly. Remove and reapply the bandage as directed by your health care provider. °· If possible, raise (elevate) the injured area above the level of your heart while you are sitting or lying down. °· Take over-the-counter and prescription medicines only as told by your health care provider. °SEEK MEDICAL CARE IF: °· Your symptoms do not  improve after several days of treatment. °· Your symptoms get worse. °· You have difficulty moving the injured area. °SEEK IMMEDIATE MEDICAL CARE IF:  °· You have severe pain. °· You have numbness in a hand or foot. °· Your hand or foot turns pale or cold. °  °This information is not intended to replace advice given to you by your health care provider. Make sure you discuss any questions you have with your health care provider. °  °Document Released: 05/26/2005 Document Revised: 05/07/2015 Document Reviewed: 01/01/2015 °Elsevier Interactive Patient Education ©2016 Elsevier Inc. °Urinary Tract Infection °Urinary tract infections (UTIs) can develop anywhere along your urinary tract. Your urinary tract is your body's drainage system for removing wastes and extra water. Your urinary tract includes two kidneys, two ureters, a bladder, and a urethra. Your kidneys are a pair of bean-shaped organs. Each kidney is about the size of your fist. They are located below your ribs, one on each side of your spine. °CAUSES °Infections are caused by microbes, which are microscopic organisms, including fungi, viruses, and bacteria. These organisms are so small that they can only be seen through a microscope. Bacteria are the microbes that most commonly cause UTIs. °SYMPTOMS  °Symptoms of UTIs may vary by age and gender of the patient and by the location of the infection. Symptoms in young women typically include a frequent and intense urge to urinate and a painful, burning feeling in the bladder or urethra during urination. Older women and men are more likely to be tired, shaky, and weak   and have muscle aches and abdominal pain. A fever may mean the infection is in your kidneys. Other symptoms of a kidney infection include pain in your back or sides below the ribs, nausea, and vomiting. °DIAGNOSIS °To diagnose a UTI, your caregiver will ask you about your symptoms. Your caregiver will also ask you to provide a urine sample. The urine  sample will be tested for bacteria and white blood cells. White blood cells are made by your body to help fight infection. °TREATMENT  °Typically, UTIs can be treated with medication. Because most UTIs are caused by a bacterial infection, they usually can be treated with the use of antibiotics. The choice of antibiotic and length of treatment depend on your symptoms and the type of bacteria causing your infection. °HOME CARE INSTRUCTIONS °· If you were prescribed antibiotics, take them exactly as your caregiver instructs you. Finish the medication even if you feel better after you have only taken some of the medication. °· Drink enough water and fluids to keep your urine clear or pale yellow. °· Avoid caffeine, tea, and carbonated beverages. They tend to irritate your bladder. °· Empty your bladder often. Avoid holding urine for long periods of time. °· Empty your bladder before and after sexual intercourse. °· After a bowel movement, women should cleanse from front to back. Use each tissue only once. °SEEK MEDICAL CARE IF:  °· You have back pain. °· You develop a fever. °· Your symptoms do not begin to resolve within 3 days. °SEEK IMMEDIATE MEDICAL CARE IF:  °· You have severe back pain or lower abdominal pain. °· You develop chills. °· You have nausea or vomiting. °· You have continued burning or discomfort with urination. °MAKE SURE YOU:  °· Understand these instructions. °· Will watch your condition. °· Will get help right away if you are not doing well or get worse. °  °This information is not intended to replace advice given to you by your health care provider. Make sure you discuss any questions you have with your health care provider. °  °Document Released: 05/26/2005 Document Revised: 05/07/2015 Document Reviewed: 09/24/2011 °Elsevier Interactive Patient Education ©2016 Elsevier Inc. ° °

## 2015-11-05 DIAGNOSIS — Z85828 Personal history of other malignant neoplasm of skin: Secondary | ICD-10-CM | POA: Diagnosis not present

## 2015-11-05 DIAGNOSIS — D225 Melanocytic nevi of trunk: Secondary | ICD-10-CM | POA: Diagnosis not present

## 2015-11-05 DIAGNOSIS — D2239 Melanocytic nevi of other parts of face: Secondary | ICD-10-CM | POA: Diagnosis not present

## 2015-11-05 DIAGNOSIS — L728 Other follicular cysts of the skin and subcutaneous tissue: Secondary | ICD-10-CM | POA: Diagnosis not present

## 2015-11-14 DIAGNOSIS — N23 Unspecified renal colic: Secondary | ICD-10-CM | POA: Diagnosis not present

## 2015-11-24 DIAGNOSIS — E538 Deficiency of other specified B group vitamins: Secondary | ICD-10-CM | POA: Diagnosis not present

## 2015-11-29 DIAGNOSIS — G20 Parkinson's disease: Secondary | ICD-10-CM | POA: Diagnosis not present

## 2015-12-01 DIAGNOSIS — H018 Other specified inflammations of eyelid: Secondary | ICD-10-CM | POA: Diagnosis not present

## 2015-12-01 DIAGNOSIS — H04123 Dry eye syndrome of bilateral lacrimal glands: Secondary | ICD-10-CM | POA: Diagnosis not present

## 2015-12-01 DIAGNOSIS — M3500 Sicca syndrome, unspecified: Secondary | ICD-10-CM | POA: Diagnosis not present

## 2015-12-15 DIAGNOSIS — N3020 Other chronic cystitis without hematuria: Secondary | ICD-10-CM | POA: Diagnosis not present

## 2015-12-15 DIAGNOSIS — R350 Frequency of micturition: Secondary | ICD-10-CM | POA: Diagnosis not present

## 2015-12-26 DIAGNOSIS — E538 Deficiency of other specified B group vitamins: Secondary | ICD-10-CM | POA: Diagnosis not present

## 2015-12-29 DIAGNOSIS — G20 Parkinson's disease: Secondary | ICD-10-CM | POA: Diagnosis not present

## 2016-01-01 DIAGNOSIS — R350 Frequency of micturition: Secondary | ICD-10-CM | POA: Diagnosis not present

## 2016-01-01 DIAGNOSIS — N3020 Other chronic cystitis without hematuria: Secondary | ICD-10-CM | POA: Diagnosis not present

## 2016-01-07 DIAGNOSIS — F3341 Major depressive disorder, recurrent, in partial remission: Secondary | ICD-10-CM | POA: Insufficient documentation

## 2016-01-07 DIAGNOSIS — G20 Parkinson's disease: Secondary | ICD-10-CM | POA: Diagnosis not present

## 2016-01-07 DIAGNOSIS — M3509 Sicca syndrome with other organ involvement: Secondary | ICD-10-CM | POA: Diagnosis not present

## 2016-01-07 DIAGNOSIS — R1310 Dysphagia, unspecified: Secondary | ICD-10-CM | POA: Diagnosis not present

## 2016-01-07 DIAGNOSIS — E538 Deficiency of other specified B group vitamins: Secondary | ICD-10-CM | POA: Diagnosis not present

## 2016-01-12 DIAGNOSIS — M3501 Sicca syndrome with keratoconjunctivitis: Secondary | ICD-10-CM | POA: Diagnosis not present

## 2016-01-12 DIAGNOSIS — H018 Other specified inflammations of eyelid: Secondary | ICD-10-CM | POA: Diagnosis not present

## 2016-01-12 DIAGNOSIS — H04123 Dry eye syndrome of bilateral lacrimal glands: Secondary | ICD-10-CM | POA: Diagnosis not present

## 2016-01-28 DIAGNOSIS — E538 Deficiency of other specified B group vitamins: Secondary | ICD-10-CM | POA: Diagnosis not present

## 2016-01-29 DIAGNOSIS — G20 Parkinson's disease: Secondary | ICD-10-CM | POA: Diagnosis not present

## 2016-02-02 DIAGNOSIS — H26492 Other secondary cataract, left eye: Secondary | ICD-10-CM | POA: Diagnosis not present

## 2016-02-18 ENCOUNTER — Other Ambulatory Visit: Payer: Self-pay | Admitting: Internal Medicine

## 2016-02-18 DIAGNOSIS — R1310 Dysphagia, unspecified: Secondary | ICD-10-CM

## 2016-02-25 DIAGNOSIS — G20 Parkinson's disease: Secondary | ICD-10-CM | POA: Diagnosis not present

## 2016-02-28 DIAGNOSIS — G20 Parkinson's disease: Secondary | ICD-10-CM | POA: Diagnosis not present

## 2016-03-01 ENCOUNTER — Ambulatory Visit
Admission: RE | Admit: 2016-03-01 | Discharge: 2016-03-01 | Disposition: A | Discharge status: Home / Self Care | Payer: PPO | Source: Ambulatory Visit | Attending: Internal Medicine | Admitting: Internal Medicine

## 2016-03-01 DIAGNOSIS — R1310 Dysphagia, unspecified: Secondary | ICD-10-CM

## 2016-03-01 DIAGNOSIS — M797 Fibromyalgia: Secondary | ICD-10-CM | POA: Diagnosis not present

## 2016-03-01 DIAGNOSIS — F419 Anxiety disorder, unspecified: Secondary | ICD-10-CM | POA: Insufficient documentation

## 2016-03-01 DIAGNOSIS — R1312 Dysphagia, oropharyngeal phase: Secondary | ICD-10-CM | POA: Diagnosis not present

## 2016-03-01 DIAGNOSIS — F329 Major depressive disorder, single episode, unspecified: Secondary | ICD-10-CM | POA: Insufficient documentation

## 2016-03-01 DIAGNOSIS — I7300 Raynaud's syndrome without gangrene: Secondary | ICD-10-CM | POA: Diagnosis not present

## 2016-03-01 DIAGNOSIS — E538 Deficiency of other specified B group vitamins: Secondary | ICD-10-CM | POA: Diagnosis not present

## 2016-03-01 DIAGNOSIS — K219 Gastro-esophageal reflux disease without esophagitis: Secondary | ICD-10-CM | POA: Diagnosis not present

## 2016-03-01 NOTE — Therapy (Signed)
[place], [place], [address] Phone: [phone]   Fax:     Modified Barium Swallow  Patient Details  Name: Kaitlyn Church MRN: [id number] Date of Birth: 1941/08/07 No Data Recorded  Encounter Date: 03/01/2016      End of Session - 03/01/16 1452    Visit Number 1   Number of Visits 1   Date for SLP Re-Evaluation 03/01/16   SLP Start Time 1300   SLP Stop Time  1353   SLP Time Calculation (min) 53 min   Activity Tolerance Patient tolerated treatment well      Past Medical History  Diagnosis Date  . Fibromyalgia   . Esophageal motility disorder   . Abdominal pain, right upper quadrant   . Trochanteric bursitis     bilateral  . Gait disturbance   . Grief reaction   . Vaginitis   . Osteoporosis   . Hemorrhoids, internal, thrombosed   . Restless leg syndrome   . Irritable bowel syndrome   . Sjogren's syndrome ([place])     ?  Marland Kitchen Interstitial cystitis   . GERD (gastroesophageal reflux disease)   . Depression   . Anxiety   . Allergic rhinitis     Past Surgical History  Procedure Laterality Date  . Appendectomy  11/1969  . Exploratory laparotomy  09/1969  . Abdominal hysterectomy  10/2002  . Tonsillectomy  1948  . Esophagogastroduodenoscopy  01/2001    nl  . Nasal septum surgery  11/1976  . Cystocele repair  1980  . Hernia repair  1980  . Rectocele repair  1980  . Polypectomy  03/1972    Bladder  . Esophagogastroduodenoscopy  09/2006    Normal Carlean Purl)    There were no vitals filed for this visit.   Subjective: Patient behavior: (alertness, ability to follow instructions, etc.): Patient is alert and able to follow directions.  She is aphonic, but did voice with max encouragement.  Chief complaint:  Difficulty swallowing food.  Weight loss.   Objective:  Radiological Procedure: A videoflouroscopic evaluation of oral-preparatory, reflex initiation, and pharyngeal phases of  the swallow was performed; as well as a screening of the upper esophageal phase.  I. POSTURE: Upright in MBS chair  II. VIEW: Lateral  III. COMPENSATORY STRATEGIES: N/A  IV. BOLUSES ADMINISTERED:   Thin Liquid: 2 small sips, 4 rapid, consecutive drinks   Nectar-thick Liquid: 1 moderate size bolus    Puree: 2 teaspoon presentations   Mechanical Soft: 1/4 graham cracker in applesauce  V. RESULTS OF EVALUATION: A. ORAL PREPARATORY PHASE: (The lips, tongue, and velum are observed for strength and coordination)       **Overall Severity Rating: Mild; decreased bolus cohesion, delayed posterior transfer; adequate mastication of cracker in soft, moist substrate  B. SWALLOW INITIATION/REFLEX: (The reflex is normal if "triggered" by the time the bolus reached the base of the tongue)  **Overall Severity Rating: Moderate; triggering at the valleculae for solids and while falling from the valleculae to the pyriform sinuses for liquids.  triggering at the valleculae for solids and while falling from the valleculae to the pyriform sinuses for liquids.    C. PHARYNGEAL PHASE: (Pharyngeal function is normal if the bolus shows rapid, smooth, and continuous transit through the pharynx and there is no pharyngeal residue after the swallow)  **Overall Severity Rating: Minimal; min decreased tongue base retraction with trace-to-mild vallecular residue  D. LARYNGEAL PENETRATION: (Material entering into the laryngeal inlet/vestibule  but not aspirated) None  E. ASPIRATION: None  F. ESOPHAGEAL PHASE: (Screening of the upper esophagus)- not viewed; patient has history of esophageal dysmotility  ASSESSMENT: 75 year old woman, with multiple diagnoses including Parkinson's, GERD, and Sjogren's, is presenting with mild oropharyngeal dysphagia primarily characterized by slow / disorganized oral management and delayed pharyngeal swallow initiation.  Timing of the pharyngeal swallow is delayed, triggering at the  valleculae for solids and while falling from the valleculae to the pyriform sinuses for liquids.  With the exception of tongue base retraction, aspects of the pharyngeal stage of swallowing including hyolaryngeal excursion, epiglottic inversion, duration/amplitude of UES opening, and laryngeal vestibule closure at the height of the swallow are within normal limits.  There is trace-to-mild vallecular residue.  There was no observed laryngeal penetration or aspiration.  The patient is not at significant risk for prandial aspiration at this time.  In view of her diagnoses, the patient is at risk for decline in oropharyngeal swallow function.  Today, she was able to [facility] a cookie in a soft, moist substrate and take rapid, consecutive drinks of thin liquid.   The patient can safely be encouraged to eat/drink more.  Please monitor for signs of aspiration including increased coughing with meals, wet or gurgly sound to voice, or temperature spikes.  Follow reflux precautions and maintain stringent oral care.  If she will participate, the patient would benefit from high effort/high intensity vocal exercises (such as the LSVT-LOUD program).  LSVT has been shown to improve neuromuscular control of the entire upper aerodigestive tract, improving oral tongue and tongue base function during the oral and pharyngeal phases of swallowing as well as improving vocal intensity.    PLAN/RECOMMENDATIONS:   A. Diet: Regular- may prefer soft/moist foods   B. Swallowing Precautions: Standard swallowing precautions; follow reflux precautions; monitor for signs of aspiration; stringent oral care   C. Recommended consultation to: follow up with MDs as scheduled   D. Therapy recommendations: LSVT-LOUD   E. Results and recommendations were discussed with the patient immediately following the study and the final report routed to referring MD.     Dysphagia, oropharyngeal phase  Dysphagia - Plan: DG OP Swallowing  Func-Medicare/Speech Path, DG OP Swallowing Func-Medicare/Speech Path      G-Codes - 03/28/16 1454    Functional Assessment Tool Used MBSS, clinical judgment   Functional Limitations Swallowing   Swallow Current Status [id number]) At least 20 percent but less than 40 percent impaired, limited or restricted   Swallow Goal Status [id number]) At least 20 percent but less than 40 percent impaired, limited or restricted   Swallow Discharge Status [phone]) At least 20 percent but less than 40 percent impaired, limited or restricted          Problem List Patient Active Problem List   Diagnosis Date Noted  . DYSPHAGIA PHARYNGEAL PHASE 07/21/2009  . ESOPHAGEAL MOTILITY DISORDER 02/03/2009  . FIBROMYALGIA 02/03/2009  . TROCHANTERIC BURSITIS, BILATERAL 06/21/2008  . HAND PAIN 06/21/2008  . GAIT DISTURBANCE 06/21/2008  . ABDOMINAL, RIGHT UPPER, PAIN 06/21/2008  . GRIEF REACTION 05/30/2008  . VAGINITIS 05/30/2008  . OSTEOPOROSIS 05/30/2008  . HEMORRHOIDS, INTERNAL, THROMBOSED 03/22/2008  . CHANGE IN BOWELS 03/22/2008  . FATIGUE 03/10/2007  . RESTLESS LEG SYNDROME 02/19/2007  . IRRITABLE BOWEL SYNDROME 02/19/2007  . ANXIETY 02/16/2007  . DEPRESSION 02/16/2007  . ALLERGIC RHINITIS 02/16/2007  . GERD 02/16/2007  . INTERSTITIAL CYSTITIS 02/16/2007  . [place] SYNDROME 02/16/2007  . FIBROSITIS 02/16/2007   Leroy Sea,  MS/CCC- SLP  Lou Miner 03/01/2016, 2:55 PM  [facility] [place], [place], [address] Phone: [phone]   Fax:     Name: MONEKE LINCICOME MRN: [id number] Date of Birth: [birth year]

## 2016-03-05 DIAGNOSIS — H00014 Hordeolum externum left upper eyelid: Secondary | ICD-10-CM | POA: Diagnosis not present

## 2016-03-05 DIAGNOSIS — H50331 Intermittent monocular exotropia, right eye: Secondary | ICD-10-CM | POA: Diagnosis not present

## 2016-03-11 DIAGNOSIS — M797 Fibromyalgia: Secondary | ICD-10-CM | POA: Diagnosis not present

## 2016-03-11 DIAGNOSIS — F3341 Major depressive disorder, recurrent, in partial remission: Secondary | ICD-10-CM | POA: Diagnosis not present

## 2016-03-11 DIAGNOSIS — M3509 Sicca syndrome with other organ involvement: Secondary | ICD-10-CM | POA: Diagnosis not present

## 2016-03-11 DIAGNOSIS — R1310 Dysphagia, unspecified: Secondary | ICD-10-CM | POA: Diagnosis not present

## 2016-03-11 DIAGNOSIS — I7300 Raynaud's syndrome without gangrene: Secondary | ICD-10-CM | POA: Diagnosis not present

## 2016-03-11 DIAGNOSIS — G20 Parkinson's disease: Secondary | ICD-10-CM | POA: Diagnosis not present

## 2016-03-11 DIAGNOSIS — N3010 Interstitial cystitis (chronic) without hematuria: Secondary | ICD-10-CM | POA: Diagnosis not present

## 2016-03-11 DIAGNOSIS — M1990 Unspecified osteoarthritis, unspecified site: Secondary | ICD-10-CM | POA: Diagnosis not present

## 2016-03-11 DIAGNOSIS — K5909 Other constipation: Secondary | ICD-10-CM | POA: Diagnosis not present

## 2016-03-11 DIAGNOSIS — E538 Deficiency of other specified B group vitamins: Secondary | ICD-10-CM | POA: Diagnosis not present

## 2016-03-11 DIAGNOSIS — M810 Age-related osteoporosis without current pathological fracture: Secondary | ICD-10-CM | POA: Diagnosis not present

## 2016-03-17 DIAGNOSIS — Z993 Dependence on wheelchair: Secondary | ICD-10-CM | POA: Diagnosis not present

## 2016-03-17 DIAGNOSIS — R262 Difficulty in walking, not elsewhere classified: Secondary | ICD-10-CM | POA: Diagnosis not present

## 2016-03-17 DIAGNOSIS — G20 Parkinson's disease: Secondary | ICD-10-CM | POA: Diagnosis not present

## 2016-04-02 DIAGNOSIS — E538 Deficiency of other specified B group vitamins: Secondary | ICD-10-CM | POA: Diagnosis not present

## 2016-04-05 ENCOUNTER — Emergency Department
Admission: EM | Admit: 2016-04-05 | Discharge: 2016-04-05 | Disposition: A | Discharge status: Home / Self Care | Payer: PPO | Attending: Emergency Medicine | Admitting: Emergency Medicine

## 2016-04-05 ENCOUNTER — Emergency Department: Payer: PPO

## 2016-04-05 DIAGNOSIS — S0992XA Unspecified injury of nose, initial encounter: Secondary | ICD-10-CM | POA: Diagnosis not present

## 2016-04-05 DIAGNOSIS — Y9289 Other specified places as the place of occurrence of the external cause: Secondary | ICD-10-CM | POA: Insufficient documentation

## 2016-04-05 DIAGNOSIS — S0990XA Unspecified injury of head, initial encounter: Secondary | ICD-10-CM

## 2016-04-05 DIAGNOSIS — W19XXXA Unspecified fall, initial encounter: Secondary | ICD-10-CM | POA: Diagnosis not present

## 2016-04-05 DIAGNOSIS — W1809XA Striking against other object with subsequent fall, initial encounter: Secondary | ICD-10-CM | POA: Insufficient documentation

## 2016-04-05 DIAGNOSIS — Z79899 Other long term (current) drug therapy: Secondary | ICD-10-CM | POA: Diagnosis not present

## 2016-04-05 DIAGNOSIS — S0993XA Unspecified injury of face, initial encounter: Secondary | ICD-10-CM | POA: Diagnosis not present

## 2016-04-05 DIAGNOSIS — Z87891 Personal history of nicotine dependence: Secondary | ICD-10-CM | POA: Diagnosis not present

## 2016-04-05 DIAGNOSIS — Z791 Long term (current) use of non-steroidal anti-inflammatories (NSAID): Secondary | ICD-10-CM | POA: Insufficient documentation

## 2016-04-05 DIAGNOSIS — G20 Parkinson's disease: Secondary | ICD-10-CM | POA: Insufficient documentation

## 2016-04-05 DIAGNOSIS — S0011XA Contusion of right eyelid and periocular area, initial encounter: Secondary | ICD-10-CM | POA: Insufficient documentation

## 2016-04-05 DIAGNOSIS — Y999 Unspecified external cause status: Secondary | ICD-10-CM | POA: Diagnosis not present

## 2016-04-05 DIAGNOSIS — Y9389 Activity, other specified: Secondary | ICD-10-CM | POA: Diagnosis not present

## 2016-04-05 HISTORY — DX: Parkinson's disease without dyskinesia, without mention of fluctuations: G20.A1

## 2016-04-05 HISTORY — DX: Parkinson's disease: G20

## 2016-04-05 LAB — URINALYSIS COMPLETE WITH MICROSCOPIC (ARMC ONLY)
BILIRUBIN URINE: NEGATIVE
GLUCOSE, UA: NEGATIVE mg/dL
Hgb urine dipstick: NEGATIVE
Ketones, ur: NEGATIVE mg/dL
Nitrite: POSITIVE — AB
Protein, ur: NEGATIVE mg/dL
Specific Gravity, Urine: 1.014 (ref 1.005–1.030)
pH: 7 (ref 5.0–8.0)

## 2016-04-05 LAB — CBC WITH DIFFERENTIAL/PLATELET
BASOS PCT: 1 %
Basophils Absolute: 0 10*3/uL (ref 0–0.1)
EOS PCT: 1 %
Eosinophils Absolute: 0.1 10*3/uL (ref 0–0.7)
HEMATOCRIT: 36 % (ref 35.0–47.0)
Hemoglobin: 12.1 g/dL (ref 12.0–16.0)
Lymphocytes Relative: 33 %
Lymphs Abs: 1.7 10*3/uL (ref 1.0–3.6)
MCH: 31.4 pg (ref 26.0–34.0)
MCHC: 33.7 g/dL (ref 32.0–36.0)
MCV: 93.4 fL (ref 80.0–100.0)
MONO ABS: 0.4 10*3/uL (ref 0.2–0.9)
Monocytes Relative: 8 %
NEUTROS ABS: 3 10*3/uL (ref 1.4–6.5)
Neutrophils Relative %: 57 %
PLATELETS: 259 10*3/uL (ref 150–440)
RBC: 3.85 MIL/uL (ref 3.80–5.20)
RDW: 12 % (ref 11.5–14.5)
WBC: 5.3 10*3/uL (ref 3.6–11.0)

## 2016-04-05 LAB — COMPREHENSIVE METABOLIC PANEL
ALBUMIN: 3.7 g/dL (ref 3.5–5.0)
ALK PHOS: 74 U/L (ref 38–126)
ALT: <5 U/L — ABNORMAL LOW (ref 14–54)
ANION GAP: 4 — AB (ref 5–15)
AST: 24 U/L (ref 15–41)
BILIRUBIN TOTAL: 0.2 mg/dL — AB (ref 0.3–1.2)
BUN: 24 mg/dL — ABNORMAL HIGH (ref 6–20)
CALCIUM: 9 mg/dL (ref 8.9–10.3)
CO2: 29 mmol/L (ref 22–32)
Chloride: 103 mmol/L (ref 101–111)
Creatinine, Ser: 0.55 mg/dL (ref 0.44–1.00)
GLUCOSE: 95 mg/dL (ref 65–99)
Potassium: 4.1 mmol/L (ref 3.5–5.1)
Sodium: 136 mmol/L (ref 135–145)
TOTAL PROTEIN: 6.2 g/dL — AB (ref 6.5–8.1)

## 2016-04-05 LAB — PROTIME-INR
INR: 0.92
Prothrombin Time: 12.3 seconds (ref 11.4–15.2)

## 2016-04-05 LAB — TROPONIN I

## 2016-04-05 LAB — GLUCOSE, CAPILLARY: GLUCOSE-CAPILLARY: 95 mg/dL (ref 65–99)

## 2016-04-05 MED ORDER — SODIUM CHLORIDE 0.9 % IV BOLUS (SEPSIS)
500.0000 mL | Freq: Once | INTRAVENOUS | Status: AC
  Administered 2016-04-05: 500 mL via INTRAVENOUS

## 2016-04-05 MED ORDER — CARBIDOPA-LEVODOPA ER 25-100 MG PO TBCR
1.5000 | EXTENDED_RELEASE_TABLET | Freq: Two times a day (BID) | ORAL | Status: DC
  Filled 2016-04-05: qty 1.5

## 2016-04-05 NOTE — Discharge Instructions (Signed)
You have been seen in the Emergency Department (ED) today for a fall.  Your work up does not show any concerning injuries.   ? ?Please follow up with your doctor regarding today's Emergency Department (ED) visit and your recent fall.   ? ?Return to the ED if you have any headache, confusion, slurred speech, weakness/numbness of any arm or leg, or any increased pain. ? ?

## 2016-04-05 NOTE — ED Notes (Signed)
Pt's friend gave nurse phone number to contact her if pt is discharged from hospital without use of EMS and wishes to return to care facility.  [phone].  Coralyn Mark

## 2016-04-05 NOTE — ED Triage Notes (Signed)
Pt comes into the ED via EMS from [facility], was reported to ems that pt fell asleep while eating and hit her face on a cup, pt has noted circular bruising around the right side of face.. When pt is asked pt does not remember what happened.Marland Kitchen

## 2016-04-05 NOTE — ED Provider Notes (Signed)
Vitals:   04/05/16 1815 04/05/16 1830  BP:  (!) 143/92  Pulse: 77 79  Resp: 15 16  Temp:      Ct Head Wo Contrast  Result Date: 04/05/2016 CLINICAL DATA:  [place] and hit her face on a cup EXAM: CT HEAD WITHOUT CONTRAST CT MAXILLOFACIAL WITHOUT CONTRAST TECHNIQUE: Multidetector CT imaging of the head and maxillofacial structures were performed using the standard protocol without intravenous contrast. Multiplanar CT image reconstructions of the maxillofacial structures were also generated. COMPARISON:  11/01/2015 FINDINGS: CT HEAD FINDINGS No skull fracture is noted. Paranasal sinuses and mastoid air cells are unremarkable. No intracranial hemorrhage, mass effect or midline shift. No acute cortical infarction. Ventricular size is stable from prior exam. No mass lesion is noted on this unenhanced scan. CT MAXILLOFACIAL FINDINGS Axial images shows no facial fractures. No nasal bone fracture. There is no facial hematoma. No intraorbital hematoma. Bilateral eye globe is unremarkable. No zygomatic fracture. Coronal images shows no orbital rim or orbital floor fracture. Bilateral semilunar canal is patent. Metallic dental artifacts are noted. No mandibular fracture is noted. No TMJ dislocation. Sagittal images shows patent nasopharyngeal and oropharyngeal airway. Mild degenerative changes C1-C2 articulation. No maxillary spine fracture is noted. IMPRESSION: 1. No acute intracranial abnormality. 2. No facial fractures of facial hematoma. No intraorbital hematoma. 3. Patent nasopharyngeal and oropharyngeal airway. 4. No mandibular fracture.  No TMJ dislocation. Electronically Signed   By: Lahoma Crocker M.D.   On: 04/05/2016 16:24   Ct Maxillofacial Wo Contrast  Result Date: 04/05/2016 CLINICAL DATA:  [place] and hit her face on a cup EXAM: CT HEAD WITHOUT CONTRAST CT MAXILLOFACIAL WITHOUT CONTRAST TECHNIQUE: Multidetector CT imaging of the head and maxillofacial structures were performed using the standard protocol  without intravenous contrast. Multiplanar CT image reconstructions of the maxillofacial structures were also generated. COMPARISON:  11/01/2015 FINDINGS: CT HEAD FINDINGS No skull fracture is noted. Paranasal sinuses and mastoid air cells are unremarkable. No intracranial hemorrhage, mass effect or midline shift. No acute cortical infarction. Ventricular size is stable from prior exam. No mass lesion is noted on this unenhanced scan. CT MAXILLOFACIAL FINDINGS Axial images shows no facial fractures. No nasal bone fracture. There is no facial hematoma. No intraorbital hematoma. Bilateral eye globe is unremarkable. No zygomatic fracture. Coronal images shows no orbital rim or orbital floor fracture. Bilateral semilunar canal is patent. Metallic dental artifacts are noted. No mandibular fracture is noted. No TMJ dislocation. Sagittal images shows patent nasopharyngeal and oropharyngeal airway. Mild degenerative changes C1-C2 articulation. No maxillary spine fracture is noted. IMPRESSION: 1. No acute intracranial abnormality. 2. No facial fractures of facial hematoma. No intraorbital hematoma. 3. Patent nasopharyngeal and oropharyngeal airway. 4. No mandibular fracture.  No TMJ dislocation. Electronically Signed   By: Lahoma Crocker M.D.   On: 04/05/2016 16:24     ----------------------------------------- 6:51 PM on 04/05/2016 -----------------------------------------  Patient resting comfortably, does report she does have some mild restless legs and needs to take her carbidopa. I have ordered this. She is alert and oriented, does have mild erythema and slight contusion across the nasal bridge. She otherwise reports no concerns. She reports that she feels comfortable going back to assisted living at [place]. I discussed her urine test with her and she tells me she has interstitial cystitis and that she would prefer not be on any antibiotic as she has no symptoms at this point. I think this is agreeable, and I will  send it for a culture which I discussed with  her. Both patient and iron agreement that if she starts to develop symptoms like a fever, increased weakness, abdominal pain, or for urine culture grows positive bacteria that we would need further treatment. Patient is agreeable.  Return precautions and treatment recommendations and follow-up discussed with the patient who is agreeable with the plan. Patient has a friend who she reports can bring her back to her assisted living tonight. Patient is comfortable with discharge.    Delman Kitten, MD 04/05/16 [phone]

## 2016-04-05 NOTE — ED Notes (Signed)
Pt requesting water; water held until pt's CTs are back.

## 2016-04-05 NOTE — ED Notes (Signed)
Pt taken to CT.

## 2016-04-05 NOTE — ED Provider Notes (Signed)
[facility] Emergency Department Provider Note  ____________________________________________   I have reviewed the triage vital signs and the nursing notes.   HISTORY  Chief Complaint Loss of Consciousness    HPI Kaitlyn Church is a 75 y.o. female who presents today complaining of "falling asleep". She states this is been going on for years where she'll fall asleep unexpectedly. She does not describe it as passing out. She feels it is a falling asleep event. She's been seen here for the same complaint before. She feels completely at her baseline at this time. She is sitting in chair and fell asleep bumping her face on the drinking glass that she was using. It did not break. She has no complaints of neck ache or headache. She has had no chest pain or shortness of breath she does not feel weak or lightheaded and she has no other complaints. She is using her ipad      Past Medical History:  Diagnosis Date  . Abdominal pain, right upper quadrant   . Allergic rhinitis   . Anxiety   . Depression   . Esophageal motility disorder   . Fibromyalgia   . Gait disturbance   . GERD (gastroesophageal reflux disease)   . Grief reaction   . Hemorrhoids, internal, thrombosed   . Interstitial cystitis   . Irritable bowel syndrome   . Osteoporosis   . Parkinson disease ([place])   . Restless leg syndrome   . Sjogren's syndrome ([place])    ?  Marland Kitchen Trochanteric bursitis    bilateral  . Vaginitis     Patient Active Problem List   Diagnosis Date Noted  . DYSPHAGIA PHARYNGEAL PHASE 07/21/2009  . ESOPHAGEAL MOTILITY DISORDER 02/03/2009  . FIBROMYALGIA 02/03/2009  . TROCHANTERIC BURSITIS, BILATERAL 06/21/2008  . HAND PAIN 06/21/2008  . GAIT DISTURBANCE 06/21/2008  . ABDOMINAL, RIGHT UPPER, PAIN 06/21/2008  . GRIEF REACTION 05/30/2008  . VAGINITIS 05/30/2008  . OSTEOPOROSIS 05/30/2008  . HEMORRHOIDS, INTERNAL, THROMBOSED 03/22/2008  . CHANGE IN BOWELS 03/22/2008  .  FATIGUE 03/10/2007  . RESTLESS LEG SYNDROME 02/19/2007  . IRRITABLE BOWEL SYNDROME 02/19/2007  . ANXIETY 02/16/2007  . DEPRESSION 02/16/2007  . ALLERGIC RHINITIS 02/16/2007  . GERD 02/16/2007  . INTERSTITIAL CYSTITIS 02/16/2007  . [place] SYNDROME 02/16/2007  . FIBROSITIS 02/16/2007    Past Surgical History:  Procedure Laterality Date  . ABDOMINAL HYSTERECTOMY  10/2002  . APPENDECTOMY  11/1969  . CYSTOCELE REPAIR  1980  . ESOPHAGOGASTRODUODENOSCOPY  01/2001   nl  . ESOPHAGOGASTRODUODENOSCOPY  09/2006   Normal Carlean Purl)  . EXPLORATORY LAPAROTOMY  09/1969  . HERNIA REPAIR  1980  . NASAL SEPTUM SURGERY  11/1976  . POLYPECTOMY  03/1972   Bladder  . [place]  . TONSILLECTOMY  1948    Current Outpatient Rx  . Order #: [id number] Class: Historical Med  . Order #: [id number] Class: Historical Med  . Order #: [id number] Class: Historical Med  . Order #: [id number] Class: Historical Med  . Order #: [id number] Class: Historical Med  . Order #: [id number] Class: Historical Med  . Order #: [id number] Class: Historical Med  . Order #: [id number] Class: Historical Med    Allergies Celecoxib; Cephalexin; Meperidine hcl; Morphine sulfate; Pantoprazole sodium; Penicillins; Propoxyphene hcl; and Sulfamethoxazole-trimethoprim  Family History  Problem Relation Age of Onset  . Breast cancer Mother   . Alcohol abuse Father   . Pneumonia Father   . Bipolar disorder Son   . Coronary artery disease Neg Hx   . Diabetes  Neg Hx   . Hypertension Neg Hx   . Colon cancer Neg Hx     Social History Social History  Substance Use Topics  . Smoking status: Former Research scientist (life sciences)  . Smokeless tobacco: Never Used  . Alcohol use No    Review of Systems Constitutional: No fever/chills Eyes: No visual changes. ENT: No sore throat. No stiff neck no neck pain Cardiovascular: Denies chest pain. Respiratory: Denies shortness of breath. Gastrointestinal:   no vomiting.  No diarrhea.  No  constipation. Genitourinary: Negative for dysuria. Musculoskeletal: Negative lower extremity swelling Skin: Negative for rash. Neurological: Negative for severe headaches, focal weakness or numbness. 10-point ROS otherwise negative.  ____________________________________________   PHYSICAL EXAM:  VITAL SIGNS: ED Triage Vitals  Enc Vitals Group     BP 04/05/16 1402 (!) 137/55     Pulse Rate 04/05/16 1402 75     Resp 04/05/16 1402 17     Temp 04/05/16 1402 97.9 F (36.6 C)     Temp Source 04/05/16 1402 Oral     SpO2 04/05/16 1402 95 %     Weight 04/05/16 1402 110 lb (49.9 kg)     Height 04/05/16 1402 5\' 5"  (1.651 m)     Head Circumference --      Peak Flow --      Pain Score 04/05/16 1403 8     Pain Loc --      Pain Edu? --      Excl. in [place]? --     Constitutional: Alert and oriented. Well appearing and in no acute distress.Using her ipad Eyes: Conjunctivae are normal. PERRL. EOMI. Head: Is a circular bruise around the right eye but does not involve the eye itself consistent with a drinking glass, no break in the skin. Nose: No congestion/rhinnorhea. Mouth/Throat: Mucous membranes are moist.  Oropharynx non-erythematous. Neck: No stridor.   Nontender with no meningismus Cardiovascular: Normal rate, regular rhythm. Grossly normal heart sounds.  Good peripheral circulation. Respiratory: Normal respiratory effort.  No retractions. Lungs CTAB. Abdominal: Soft and nontender. No distention. No guarding no rebound Back:  There is no focal tenderness or step off.  there is no midline tenderness there are no lesions noted. there is no CVA tenderness Musculoskeletal: No lower extremity tenderness, no upper extremity tenderness. No joint effusions, no DVT signs strong distal pulses no edema Neurologic:  Normal speech and language. No gross focal neurologic deficits are appreciated.  Skin:  Skin is warm, dry and intact. No rash noted. Psychiatric: Mood and affect are normal. Speech and  behavior are normal.  ____________________________________________   LABS (all labs ordered are listed, but only abnormal results are displayed)  Labs Reviewed  GLUCOSE, CAPILLARY  COMPREHENSIVE METABOLIC PANEL  CBC WITH DIFFERENTIAL/PLATELET  PROTIME-INR  TROPONIN I  URINALYSIS COMPLETEWITH MICROSCOPIC ([facility] ONLY)   ____________________________________________  EKG  I personally interpreted any EKGs ordered by me or triageSinus rhythm at 76 bpm no acute ST elevation or acute ST depression nonspecific ST changes, a slight artifact limits. Results to some extent. ____________________________________________  RADIOLOGY  I reviewed any imaging ordered by me or triage that were performed during my shift and, if possible, patient and/or family made aware of any abnormal findings. ____________________________________________   PROCEDURES  Procedure(s) performed: None  Procedures  Critical Care performed: None  ____________________________________________   INITIAL IMPRESSION / ASSESSMENT AND PLAN / ED COURSE  Pertinent labs & imaging results that were available during my care of the patient were reviewed by me and considered  in my medical decision making (see chart for details).  Very well-appearing woman who presents today after "falling asleep" is unclear if this is an actual syncopal event. Patient is apparently on Xanax and caused her to fall asleep unexpectedly. We will obtain a CT scan of her head and face and A baseline syncope workup in the emergency room to make sure nothing else is going on. It is her preference to go home. Vital signs and neurologic exam is reassuring at this time.  Clinical Course   ____________________________________________   FINAL CLINICAL IMPRESSION(S) / ED DIAGNOSES  Final diagnoses:  Closed head injury      This chart was dictated using voice recognition software.  Despite best efforts to proofread,  errors can occur which can  change meaning.      Schuyler Amor, MD 04/05/16 [phone]

## 2016-04-07 LAB — URINE CULTURE

## 2016-04-16 DIAGNOSIS — N3010 Interstitial cystitis (chronic) without hematuria: Secondary | ICD-10-CM | POA: Diagnosis not present

## 2016-04-16 DIAGNOSIS — M3509 Sicca syndrome with other organ involvement: Secondary | ICD-10-CM | POA: Diagnosis not present

## 2016-04-16 DIAGNOSIS — R300 Dysuria: Secondary | ICD-10-CM | POA: Diagnosis not present

## 2016-04-16 DIAGNOSIS — F3341 Major depressive disorder, recurrent, in partial remission: Secondary | ICD-10-CM | POA: Diagnosis not present

## 2016-04-16 DIAGNOSIS — R399 Unspecified symptoms and signs involving the genitourinary system: Secondary | ICD-10-CM | POA: Diagnosis not present

## 2016-04-16 DIAGNOSIS — G20 Parkinson's disease: Secondary | ICD-10-CM | POA: Diagnosis not present

## 2016-04-30 DIAGNOSIS — N3021 Other chronic cystitis with hematuria: Secondary | ICD-10-CM | POA: Diagnosis not present

## 2016-05-07 DIAGNOSIS — E538 Deficiency of other specified B group vitamins: Secondary | ICD-10-CM | POA: Diagnosis not present

## 2016-05-24 ENCOUNTER — Ambulatory Visit: Payer: Self-pay

## 2016-06-01 ENCOUNTER — Ambulatory Visit (INDEPENDENT_AMBULATORY_CARE_PROVIDER_SITE_OTHER): Payer: PPO | Admitting: Urology

## 2016-06-01 ENCOUNTER — Encounter: Payer: Self-pay | Admitting: Urology

## 2016-06-01 VITALS — BP 134/79 | HR 68 | Ht 65.0 in | Wt 111.7 lb

## 2016-06-01 DIAGNOSIS — N3020 Other chronic cystitis without hematuria: Secondary | ICD-10-CM

## 2016-06-01 DIAGNOSIS — G20 Parkinson's disease: Secondary | ICD-10-CM | POA: Insufficient documentation

## 2016-06-01 DIAGNOSIS — M1990 Unspecified osteoarthritis, unspecified site: Secondary | ICD-10-CM | POA: Insufficient documentation

## 2016-06-01 DIAGNOSIS — M810 Age-related osteoporosis without current pathological fracture: Secondary | ICD-10-CM | POA: Insufficient documentation

## 2016-06-01 DIAGNOSIS — E538 Deficiency of other specified B group vitamins: Secondary | ICD-10-CM | POA: Insufficient documentation

## 2016-06-01 DIAGNOSIS — M3500 Sicca syndrome, unspecified: Secondary | ICD-10-CM | POA: Insufficient documentation

## 2016-06-01 NOTE — Progress Notes (Signed)
06/01/2016 2:51 PM   Kaitlyn Church 02/06/1941 [id number]  Referring provider: Adin Hector, MD [facility] [place], [place] [address]  Chief Complaint  Patient presents with  . New Patient (Initial Visit)    HPI: Kaitlyn Church is a 75yo here for followup for chronic cystitis. She was treated with vantin for a UTI whch resolved her dysuria and urgency. She is very happy with uribel   PMH: Past Medical History:  Diagnosis Date  . Abdominal pain, right upper quadrant   . Allergic rhinitis   . Anxiety   . Depression   . Esophageal motility disorder   . Fibromyalgia   . Gait disturbance   . GERD (gastroesophageal reflux disease)   . Grief reaction   . Hemorrhoids, internal, thrombosed   . Interstitial cystitis   . Irritable bowel syndrome   . Osteoporosis   . Parkinson disease ([place])   . Restless leg syndrome   . Sjogren's syndrome ([place])    ?  Marland Kitchen Trochanteric bursitis    bilateral  . Vaginitis     Surgical History: Past Surgical History:  Procedure Laterality Date  . ABDOMINAL HYSTERECTOMY  10/2002  . APPENDECTOMY  11/1969  . CYSTOCELE REPAIR  1980  . ESOPHAGOGASTRODUODENOSCOPY  01/2001   nl  . ESOPHAGOGASTRODUODENOSCOPY  09/2006   Normal Carlean Purl)  . EXPLORATORY LAPAROTOMY  09/1969  . HERNIA REPAIR  1980  . NASAL SEPTUM SURGERY  11/1976  . POLYPECTOMY  03/1972   Bladder  . [place]  . TONSILLECTOMY  1948    Home Medications:    Medication List       Accurate as of 06/01/16  2:51 PM. Always use your most recent med list.          acetaminophen 500 MG tablet Commonly known as:  TYLENOL Take 500 mg by mouth every 4 (four) hours as needed.   ALPRAZolam 0.5 MG tablet Commonly known as:  XANAX Take 0.5 mg by mouth 4 (four) times daily.   alum & mag hydroxide-simeth 200-200-20 MG/5ML suspension Commonly known as:  MAALOX/MYLANTA Take 30 mLs by mouth every 6 (six) hours as needed for indigestion or  heartburn.   ARNICARE ARNICA EX Apply 1 application topically daily as needed.   BENEFIBER Powd Take 10 mLs by mouth 2 (two) times daily as needed.   bisacodyl 10 MG suppository Commonly known as:  DULCOLAX Place 10 mg rectally daily as needed for moderate constipation.   Carbidopa-Levodopa ER 25-100 MG tablet controlled release Commonly known as:  SINEMET CR Take 1.5 tablets by mouth 3 (three) times daily.   carbidopa-levodopa 25-100 MG tablet Commonly known as:  SINEMET IR   desonide 0.05 % cream Commonly known as:  DESOWEN Apply 1 application topically 2 (two) times daily as needed.   dimenhyDRINATE 50 MG tablet Commonly known as:  DRAMAMINE Take 50 mg by mouth every 4 (four) hours as needed.   estrogens (conjugated) 0.625 MG tablet Commonly known as:  PREMARIN Take 0.625 mg by mouth daily. Take daily for 21 days then do not take for 7 days.   FLUoxetine 10 MG tablet Commonly known as:  PROZAC Take 5 mg by mouth daily.   ibuprofen 200 MG tablet Commonly known as:  ADVIL,MOTRIN Take 200 mg by mouth 2 (two) times daily.   loperamide 2 MG capsule Commonly known as:  IMODIUM Take 2 mg by mouth as needed for diarrhea or loose stools.  loratadine 10 MG tablet Commonly known as:  CLARITIN Take 10 mg by mouth daily as needed.   magnesium hydroxide 400 MG/5ML suspension Commonly known as:  MILK OF MAGNESIA Take 30 mLs by mouth at bedtime as needed for mild constipation.   magnesium oxide 400 MG tablet Commonly known as:  MAG-OX Take 400 mg by mouth at bedtime as needed.   phenazopyridine 95 MG tablet Commonly known as:  PYRIDIUM Take 95 mg by mouth 4 (four) times daily. After each meal   SYSTANE 0.4-0.3 % Gel ophthalmic gel Generic drug:  Polyethyl Glycol-Propyl Glycol Place 1 application into both eyes 4 (four) times daily.   URIBEL 118 MG Caps Take by mouth.   XIIDRA 5 % Soln Generic drug:  Lifitegrast Apply 1 drop to eye 2 (two) times daily.        Allergies:  Allergies  Allergen Reactions  . Lidocaine Other (See Comments)  . Other Other (See Comments)    darvocet. darvocet - HALLUCINATIONS  . Propoxyphene Other (See Comments)    Other Reaction: hallucinations, weird dreams  . Ascorbic Acid Other (See Comments)  . Azithromycin Other (See Comments)    Other Reaction: sore, swollen tongue  . Basil Oil Other (See Comments)  . Calcium Other (See Comments)  . Celecoxib Other (See Comments)  . Cephalexin     REACTION: felt tired but tolerated  . Dexamethasone Other (See Comments)    Other Reaction: insomnia, chest spasms  . Doxycycline Hives  . Meperidine Hcl   . Meperidine Hcl Other (See Comments)  . Mirabegron Other (See Comments)    intolerant  . Morphine Hives  . Morphine Sulfate   . [place] Oil Other (See Comments)  . Pantoprazole Sodium     REACTION: diarrhea  . Pantoprazole Sodium Other (See Comments)    REACTION: diarrhea  . Penicillins Other (See Comments)  . Propoxyphene Hcl   . Sulfa Antibiotics Other (See Comments)    Swollen lip and tongue Blisters on tongue  . Sulfamethoxazole-Trimethoprim Other (See Comments)  . Thimerosal Other (See Comments)    Eye burning    Family History: Family History  Problem Relation Age of Onset  . Breast cancer Mother   . Alcohol abuse Father   . Pneumonia Father   . Bipolar disorder Son   . Coronary artery disease Neg Hx   . Diabetes Neg Hx   . Hypertension Neg Hx   . Colon cancer Neg Hx   . Prostate cancer Neg Hx   . Kidney cancer Neg Hx     Social History:  reports that she has quit smoking. She has never used smokeless tobacco. She reports that she does not drink alcohol or use drugs.  ROS: UROLOGY Frequent Urination?: No Hard to postpone urination?: No Burning/pain with urination?: No Get up at night to urinate?: No Leakage of urine?: No Urine stream starts and stops?: No Trouble starting stream?: No Do you have to strain to urinate?: No Blood in  urine?: No Urinary tract infection?: No Sexually transmitted disease?: No Injury to kidneys or bladder?: No Painful intercourse?: No Weak stream?: No Currently pregnant?: No Vaginal bleeding?: No Last menstrual period?: N  Gastrointestinal Nausea?: No Vomiting?: No Indigestion/heartburn?: Yes Diarrhea?: No Constipation?: No  Constitutional Fever: No Night sweats?: No Weight loss?: No Fatigue?: No  Skin Skin rash/lesions?: Yes Itching?: Yes  Eyes Blurred vision?: No Double vision?: Yes  Ears/Nose/Throat Sore throat?: No Sinus problems?: No  Hematologic/Lymphatic Swollen glands?: No Easy bruising?: No  Cardiovascular Leg swelling?: No Chest pain?: No  Respiratory Cough?: No Shortness of breath?: No  Endocrine Excessive thirst?: No  Musculoskeletal Back pain?: Yes Joint pain?: No  Neurological Headaches?: No Dizziness?: No  Psychologic Depression?: No Anxiety?: No  Physical Exam: BP 134/79 (BP Location: Right Arm, Patient Position: Sitting, Cuff Size: Normal)   Pulse 68   Ht 5\' 5"  (1.651 m)   Wt 50.7 kg (111 lb 11.2 oz)   BMI 18.59 kg/m   Constitutional:  Alert and oriented, No acute distress. HEENT: [place] AT, moist mucus membranes.  Trachea midline, no masses. Cardiovascular: No clubbing, cyanosis, or edema. Respiratory: Normal respiratory effort, no increased work of breathing. GI: Abdomen is soft, nontender, nondistended, no abdominal masses GU: No CVA tenderness. Skin: No rashes, bruises or suspicious lesions. Lymph: No cervical or inguinal adenopathy. Neurologic: Grossly intact, no focal deficits, moving all 4 extremities. Psychiatric: Normal mood and affect.  Laboratory Data: Lab Results  Component Value Date   WBC 5.3 04/05/2016   HGB 12.1 04/05/2016   HCT 36.0 04/05/2016   MCV 93.4 04/05/2016   PLT 259 04/05/2016    Lab Results  Component Value Date   CREATININE 0.55 04/05/2016    No results found for: PSA  No results  found for: TESTOSTERONE  No results found for: HGBA1C  Urinalysis    Component Value Date/Time   COLORURINE AMBER (A) 04/05/2016 1614   APPEARANCEUR CLEAR (A) 04/05/2016 1614   LABSPEC 1.014 04/05/2016 1614   PHURINE 7.0 04/05/2016 1614   GLUCOSEU NEGATIVE 04/05/2016 1614   HGBUR NEGATIVE 04/05/2016 1614   BILIRUBINUR NEGATIVE 04/05/2016 1614   KETONESUR NEGATIVE 04/05/2016 1614   PROTEINUR NEGATIVE 04/05/2016 1614   NITRITE POSITIVE (A) 04/05/2016 1614   LEUKOCYTESUR 2+ (A) 04/05/2016 1614    Pertinent Imaging: none  Assessment & Plan:    1. Dysuria -uribel BID PRN  2. Chronic cystitis: -observation  There are no diagnoses linked to this encounter.  No Follow-up on file.  Nicolette Bang, MD  [facility] [address] [place], [place] [address] [phone]

## 2016-06-04 DIAGNOSIS — N3010 Interstitial cystitis (chronic) without hematuria: Secondary | ICD-10-CM | POA: Diagnosis not present

## 2016-06-04 DIAGNOSIS — E538 Deficiency of other specified B group vitamins: Secondary | ICD-10-CM | POA: Diagnosis not present

## 2016-06-11 DIAGNOSIS — G20 Parkinson's disease: Secondary | ICD-10-CM | POA: Diagnosis not present

## 2016-06-11 DIAGNOSIS — E538 Deficiency of other specified B group vitamins: Secondary | ICD-10-CM | POA: Diagnosis not present

## 2016-06-11 DIAGNOSIS — Z23 Encounter for immunization: Secondary | ICD-10-CM | POA: Diagnosis not present

## 2016-06-11 DIAGNOSIS — F3341 Major depressive disorder, recurrent, in partial remission: Secondary | ICD-10-CM | POA: Diagnosis not present

## 2016-06-11 DIAGNOSIS — M3509 Sicca syndrome with other organ involvement: Secondary | ICD-10-CM | POA: Diagnosis not present

## 2016-07-01 DIAGNOSIS — H26491 Other secondary cataract, right eye: Secondary | ICD-10-CM | POA: Diagnosis not present

## 2016-07-16 DIAGNOSIS — E538 Deficiency of other specified B group vitamins: Secondary | ICD-10-CM | POA: Diagnosis not present

## 2016-07-19 DIAGNOSIS — R300 Dysuria: Secondary | ICD-10-CM | POA: Diagnosis not present

## 2016-07-27 DIAGNOSIS — H50331 Intermittent monocular exotropia, right eye: Secondary | ICD-10-CM | POA: Diagnosis not present

## 2016-08-12 DIAGNOSIS — R300 Dysuria: Secondary | ICD-10-CM | POA: Diagnosis not present

## 2016-08-16 DIAGNOSIS — G20 Parkinson's disease: Secondary | ICD-10-CM | POA: Diagnosis not present

## 2016-08-20 DIAGNOSIS — E538 Deficiency of other specified B group vitamins: Secondary | ICD-10-CM | POA: Diagnosis not present

## 2016-09-03 ENCOUNTER — Ambulatory Visit: Payer: PPO

## 2016-09-13 ENCOUNTER — Ambulatory Visit: Payer: PPO | Admitting: Urology

## 2016-09-13 ENCOUNTER — Encounter: Payer: Self-pay | Admitting: Urology

## 2016-09-13 VITALS — BP 112/62 | HR 68 | Ht 65.0 in

## 2016-09-13 DIAGNOSIS — R300 Dysuria: Secondary | ICD-10-CM | POA: Diagnosis not present

## 2016-09-13 NOTE — Progress Notes (Signed)
09/13/2016 4:22 PM   Kaitlyn Church 1940-12-10 [id number]  Referring provider: Adin Hector, MD [place] [facility] [place], [place] [address]  Chief Complaint  Patient presents with  . Follow-up    HPI: [name] is a 76yo here for followup for chronic cystitis. She was treated with vantin for a UTI whch resolved her dysuria and urgency. She is very happy with uribel    Today The patient is well known to myself from [place]. She has positive and negative cultures documented shown to me by her family. I'm not convinced she is infected today. She's having some more hesitancy and stopping and starting. Overall the symptoms are milder  Modifying factors: There are no other modifying factors  Associated signs and symptoms: There are no other associated signs and symptoms Aggravating and relieving factors: There are no other aggravating or relieving factors Severity: Moderate Duration: Persistent   PMH: Past Medical History:  Diagnosis Date  . Abdominal pain, right upper quadrant   . Allergic rhinitis   . Anxiety   . Depression   . Esophageal motility disorder   . Fibromyalgia   . Gait disturbance   . GERD (gastroesophageal reflux disease)   . Grief reaction   . Hemorrhoids, internal, thrombosed   . Interstitial cystitis   . Irritable bowel syndrome   . Osteoporosis   . Parkinson disease ([place])   . Restless leg syndrome   . Sjogren's syndrome ([place])    ?  Marland Kitchen Trochanteric bursitis    bilateral  . Vaginitis     Surgical History: Past Surgical History:  Procedure Laterality Date  . ABDOMINAL HYSTERECTOMY  10/2002  . APPENDECTOMY  11/1969  . CYSTOCELE REPAIR  1980  . ESOPHAGOGASTRODUODENOSCOPY  01/2001   nl  . ESOPHAGOGASTRODUODENOSCOPY  09/2006   Normal Carlean Purl)  . EXPLORATORY LAPAROTOMY  09/1969  . HERNIA REPAIR  1980  . NASAL SEPTUM SURGERY  11/1976  . POLYPECTOMY  03/1972   Bladder  . [facility]  . TONSILLECTOMY   1948    Home Medications:  Allergies as of 09/13/2016      Reactions   Lidocaine Other (See Comments)   Other Other (See Comments)   darvocet. darvocet - HALLUCINATIONS   Propoxyphene Other (See Comments)   Other Reaction: hallucinations, weird dreams   Ascorbic Acid Other (See Comments)   Azithromycin Other (See Comments)   Other Reaction: sore, swollen tongue   Basil Oil Other (See Comments)   Calcium Other (See Comments)   Celecoxib Other (See Comments)   Cephalexin    REACTION: felt tired but tolerated   Dexamethasone Other (See Comments)   Other Reaction: insomnia, chest spasms   Doxycycline Hives   Meperidine Hcl    Meperidine Hcl Other (See Comments)   Mirabegron Other (See Comments)   intolerant   Morphine Hives   Morphine Sulfate    Origanum Oil Other (See Comments)   Pantoprazole Sodium    REACTION: diarrhea   Pantoprazole Sodium Other (See Comments)   REACTION: diarrhea   Penicillins Other (See Comments)   Propoxyphene Hcl    Sulfa Antibiotics Other (See Comments)   Swollen lip and tongue Blisters on tongue   Sulfamethoxazole-trimethoprim Other (See Comments)   Thimerosal Other (See Comments)   Eye burning      Medication List       Accurate as of 09/13/16  4:22 PM. Always use your most recent med list.  acetaminophen 500 MG tablet Commonly known as:  TYLENOL Take 500 mg by mouth every 4 (four) hours as needed.   ALPRAZolam 0.5 MG tablet Commonly known as:  XANAX Take 0.5 mg by mouth 4 (four) times daily.   alum & mag hydroxide-simeth 200-200-20 MG/5ML suspension Commonly known as:  MAALOX/MYLANTA Take 30 mLs by mouth every 6 (six) hours as needed for indigestion or heartburn.   ARNICARE ARNICA EX Apply 1 application topically daily as needed.   BENEFIBER Powd Take 10 mLs by mouth 2 (two) times daily as needed.   bisacodyl 10 MG suppository Commonly known as:  DULCOLAX Place 10 mg rectally daily as needed for moderate  constipation.   Carbidopa-Levodopa ER 25-100 MG tablet controlled release Commonly known as:  SINEMET CR Take 1.5 tablets by mouth 3 (three) times daily.   carbidopa-levodopa 25-100 MG tablet Commonly known as:  SINEMET IR   desonide 0.05 % cream Commonly known as:  DESOWEN Apply 1 application topically 2 (two) times daily as needed.   dimenhyDRINATE 50 MG tablet Commonly known as:  DRAMAMINE Take 50 mg by mouth every 4 (four) hours as needed.   estrogens (conjugated) 0.625 MG tablet Commonly known as:  PREMARIN Take 0.625 mg by mouth daily. Take daily for 21 days then do not take for 7 days.   FLUoxetine 10 MG tablet Commonly known as:  PROZAC Take 5 mg by mouth daily.   ibuprofen 200 MG tablet Commonly known as:  ADVIL,MOTRIN Take 200 mg by mouth 2 (two) times daily.   loperamide 2 MG capsule Commonly known as:  IMODIUM Take 2 mg by mouth as needed for diarrhea or loose stools.   loratadine 10 MG tablet Commonly known as:  CLARITIN Take 10 mg by mouth daily as needed.   magnesium hydroxide 400 MG/5ML suspension Commonly known as:  MILK OF MAGNESIA Take 30 mLs by mouth at bedtime as needed for mild constipation.   magnesium oxide 400 MG tablet Commonly known as:  MAG-OX Take 400 mg by mouth at bedtime as needed.   phenazopyridine 95 MG tablet Commonly known as:  PYRIDIUM Take 95 mg by mouth 4 (four) times daily. After each meal   SYSTANE 0.4-0.3 % Gel ophthalmic gel Generic drug:  Polyethyl Glycol-Propyl Glycol Place 1 application into both eyes 4 (four) times daily.   URIBEL 118 MG Caps Take by mouth.   XIIDRA 5 % Soln Generic drug:  Lifitegrast Apply 1 drop to eye 2 (two) times daily.       Allergies:  Allergies  Allergen Reactions  . Lidocaine Other (See Comments)  . Other Other (See Comments)    darvocet. darvocet - HALLUCINATIONS  . Propoxyphene Other (See Comments)    Other Reaction: hallucinations, weird dreams  . Ascorbic Acid Other (See  Comments)  . Azithromycin Other (See Comments)    Other Reaction: sore, swollen tongue  . Basil Oil Other (See Comments)  . Calcium Other (See Comments)  . Celecoxib Other (See Comments)  . Cephalexin     REACTION: felt tired but tolerated  . Dexamethasone Other (See Comments)    Other Reaction: insomnia, chest spasms  . Doxycycline Hives  . Meperidine Hcl   . Meperidine Hcl Other (See Comments)  . Mirabegron Other (See Comments)    intolerant  . Morphine Hives  . Morphine Sulfate   . Origanum Oil Other (See Comments)  . Pantoprazole Sodium     REACTION: diarrhea  . Pantoprazole Sodium Other (See Comments)  REACTION: diarrhea  . Penicillins Other (See Comments)  . Propoxyphene Hcl   . Sulfa Antibiotics Other (See Comments)    Swollen lip and tongue Blisters on tongue  . Sulfamethoxazole-Trimethoprim Other (See Comments)  . Thimerosal Other (See Comments)    Eye burning    Family History: Family History  Problem Relation Age of Onset  . Breast cancer Mother   . Alcohol abuse Father   . Pneumonia Father   . Bipolar disorder Son   . Coronary artery disease Neg Hx   . Diabetes Neg Hx   . Hypertension Neg Hx   . Colon cancer Neg Hx   . Prostate cancer Neg Hx   . Kidney cancer Neg Hx     Social History:  reports that she has quit smoking. She has never used smokeless tobacco. She reports that she does not drink alcohol or use drugs.  ROS: UROLOGY Frequent Urination?: No Hard to postpone urination?: No Burning/pain with urination?: No Get up at night to urinate?: No Leakage of urine?: No Urine stream starts and stops?: Yes Trouble starting stream?: Yes Do you have to strain to urinate?: Yes Blood in urine?: No Urinary tract infection?: No Sexually transmitted disease?: No Injury to kidneys or bladder?: No Painful intercourse?: No Weak stream?: No Currently pregnant?: No Vaginal bleeding?: No Last menstrual period?: n  Gastrointestinal Nausea?:  No Vomiting?: No Indigestion/heartburn?: No Diarrhea?: No Constipation?: No  Constitutional Fever: No Night sweats?: No Weight loss?: No Fatigue?: No  Skin Skin rash/lesions?: No Itching?: No  Eyes Blurred vision?: No Double vision?: No  Ears/Nose/Throat Sore throat?: No Sinus problems?: No  Hematologic/Lymphatic Swollen glands?: No Easy bruising?: No  Cardiovascular Leg swelling?: No Chest pain?: No  Respiratory Cough?: No Shortness of breath?: No  Endocrine Excessive thirst?: No  Musculoskeletal Back pain?: No Joint pain?: No  Neurological Headaches?: No Dizziness?: No  Psychologic Depression?: No Anxiety?: No  Physical Exam: BP 112/62 (BP Location: Right Arm, Patient Position: Sitting, Cuff Size: Normal)   Pulse 68   Ht 5\' 5"  (1.651 m)     Laboratory Data: Lab Results  Component Value Date   WBC 5.3 04/05/2016   HGB 12.1 04/05/2016   HCT 36.0 04/05/2016   MCV 93.4 04/05/2016   PLT 259 04/05/2016    Lab Results  Component Value Date   CREATININE 0.55 04/05/2016    No results found for: PSA  No results found for: TESTOSTERONE  No results found for: HGBA1C  Urinalysis    Component Value Date/Time   COLORURINE AMBER (A) 04/05/2016 1614   APPEARANCEUR CLEAR (A) 04/05/2016 1614   LABSPEC 1.014 04/05/2016 1614   PHURINE 7.0 04/05/2016 1614   GLUCOSEU NEGATIVE 04/05/2016 1614   HGBUR NEGATIVE 04/05/2016 1614   BILIRUBINUR NEGATIVE 04/05/2016 1614   KETONESUR NEGATIVE 04/05/2016 1614   PROTEINUR NEGATIVE 04/05/2016 1614   NITRITE POSITIVE (A) 04/05/2016 1614   LEUKOCYTESUR 2+ (A) 04/05/2016 1614    Pertinent Imaging: none  Assessment & Plan:  I think it is best that we send the urine for culture and call her if the culture is positive. She thinks the generic uribell does not work as well but is much less expensive  We will call with the culture results regardless of the results. We agreed to see her when necessary. I do not  believe she needs a dilation and this was rediscussed  There are no diagnoses linked to this encounter.  No Follow-up on file.  Satia Winger A, MD  [place] [address] [place],  [address] [phone]

## 2016-09-18 LAB — CULTURE, URINE COMPREHENSIVE

## 2016-09-30 DIAGNOSIS — E538 Deficiency of other specified B group vitamins: Secondary | ICD-10-CM | POA: Diagnosis not present

## 2016-10-14 ENCOUNTER — Other Ambulatory Visit: Payer: Self-pay

## 2016-10-14 DIAGNOSIS — R309 Painful micturition, unspecified: Secondary | ICD-10-CM | POA: Diagnosis not present

## 2016-10-18 ENCOUNTER — Ambulatory Visit: Payer: Medicare PPO | Admitting: Podiatry

## 2016-10-28 DIAGNOSIS — G20 Parkinson's disease: Secondary | ICD-10-CM | POA: Diagnosis not present

## 2016-11-01 ENCOUNTER — Ambulatory Visit (INDEPENDENT_AMBULATORY_CARE_PROVIDER_SITE_OTHER): Payer: PPO | Admitting: Podiatry

## 2016-11-01 ENCOUNTER — Encounter: Payer: Self-pay | Admitting: Podiatry

## 2016-11-01 DIAGNOSIS — B351 Tinea unguium: Secondary | ICD-10-CM | POA: Diagnosis not present

## 2016-11-01 DIAGNOSIS — E538 Deficiency of other specified B group vitamins: Secondary | ICD-10-CM | POA: Diagnosis not present

## 2016-11-01 NOTE — Progress Notes (Signed)
Patient presents for nail care.  Her nails are painful walking and wearing her shoes.  She has especially painful big toes due to contracture of these toes.  She has history of Parkinson"s .    GENERAL APPEARANCE: Alert, conversant. Appropriately groomed. No acute distress.  VASCULAR: Pedal pulses are  palpable at  [facility] and PT bilateral.  Capillary refill time is immediate to all digits,  Normal temperature gradient.   NEUROLOGIC: sensation is normal to 5.07 monofilament at 5/5 sites bilateral.  Light touch is intact bilateral, Muscle strength normal.  MUSCULOSKELETAL: acceptable muscle strength, tone and stability bilateral.  Intrinsic muscluature intact bilateral.  Rectus appearance of foot and digits noted bilateral.   DERMATOLOGIC: skin color, texture, and turgor are within normal limits.  No preulcerative lesions or ulcers  are seen, no interdigital maceration noted.  No open lesions present.   No drainage noted.  NAILS  Thick disfigured discolored nails both feet.  Onychomycosis   Debridement of nails  B/l  RTC 3 months.   Gardiner Barefoot DPM

## 2016-11-17 DIAGNOSIS — R309 Painful micturition, unspecified: Secondary | ICD-10-CM | POA: Diagnosis not present

## 2016-12-08 DIAGNOSIS — M3509 Sicca syndrome with other organ involvement: Secondary | ICD-10-CM | POA: Diagnosis not present

## 2016-12-08 DIAGNOSIS — E538 Deficiency of other specified B group vitamins: Secondary | ICD-10-CM | POA: Diagnosis not present

## 2016-12-08 DIAGNOSIS — G20 Parkinson's disease: Secondary | ICD-10-CM | POA: Diagnosis not present

## 2016-12-14 DIAGNOSIS — M3509 Sicca syndrome with other organ involvement: Secondary | ICD-10-CM | POA: Diagnosis not present

## 2016-12-14 DIAGNOSIS — E538 Deficiency of other specified B group vitamins: Secondary | ICD-10-CM | POA: Diagnosis not present

## 2016-12-14 DIAGNOSIS — M797 Fibromyalgia: Secondary | ICD-10-CM | POA: Diagnosis not present

## 2016-12-14 DIAGNOSIS — N3010 Interstitial cystitis (chronic) without hematuria: Secondary | ICD-10-CM | POA: Diagnosis not present

## 2016-12-14 DIAGNOSIS — G20 Parkinson's disease: Secondary | ICD-10-CM | POA: Diagnosis not present

## 2016-12-14 DIAGNOSIS — M810 Age-related osteoporosis without current pathological fracture: Secondary | ICD-10-CM | POA: Diagnosis not present

## 2016-12-14 DIAGNOSIS — R1310 Dysphagia, unspecified: Secondary | ICD-10-CM | POA: Diagnosis not present

## 2016-12-14 DIAGNOSIS — K5909 Other constipation: Secondary | ICD-10-CM | POA: Diagnosis not present

## 2016-12-14 DIAGNOSIS — F3341 Major depressive disorder, recurrent, in partial remission: Secondary | ICD-10-CM | POA: Diagnosis not present

## 2017-01-10 DIAGNOSIS — E538 Deficiency of other specified B group vitamins: Secondary | ICD-10-CM | POA: Diagnosis not present

## 2017-02-14 DIAGNOSIS — E538 Deficiency of other specified B group vitamins: Secondary | ICD-10-CM | POA: Diagnosis not present

## 2017-02-17 ENCOUNTER — Emergency Department
Admission: EM | Admit: 2017-02-17 | Discharge: 2017-02-17 | Disposition: A | Discharge status: Skilled Nursing Facility | Payer: PPO | Attending: Emergency Medicine | Admitting: Emergency Medicine

## 2017-02-17 ENCOUNTER — Emergency Department: Payer: PPO

## 2017-02-17 DIAGNOSIS — Z79899 Other long term (current) drug therapy: Secondary | ICD-10-CM | POA: Insufficient documentation

## 2017-02-17 DIAGNOSIS — Y998 Other external cause status: Secondary | ICD-10-CM | POA: Diagnosis not present

## 2017-02-17 DIAGNOSIS — S0033XA Contusion of nose, initial encounter: Secondary | ICD-10-CM | POA: Insufficient documentation

## 2017-02-17 DIAGNOSIS — W19XXXA Unspecified fall, initial encounter: Secondary | ICD-10-CM | POA: Diagnosis not present

## 2017-02-17 DIAGNOSIS — W050XXA Fall from non-moving wheelchair, initial encounter: Secondary | ICD-10-CM | POA: Diagnosis not present

## 2017-02-17 DIAGNOSIS — Z87891 Personal history of nicotine dependence: Secondary | ICD-10-CM | POA: Diagnosis not present

## 2017-02-17 DIAGNOSIS — G20 Parkinson's disease: Secondary | ICD-10-CM | POA: Diagnosis not present

## 2017-02-17 DIAGNOSIS — R0600 Dyspnea, unspecified: Secondary | ICD-10-CM | POA: Diagnosis not present

## 2017-02-17 DIAGNOSIS — S0083XA Contusion of other part of head, initial encounter: Secondary | ICD-10-CM | POA: Diagnosis not present

## 2017-02-17 DIAGNOSIS — Y9389 Activity, other specified: Secondary | ICD-10-CM | POA: Insufficient documentation

## 2017-02-17 DIAGNOSIS — Z791 Long term (current) use of non-steroidal anti-inflammatories (NSAID): Secondary | ICD-10-CM | POA: Diagnosis not present

## 2017-02-17 DIAGNOSIS — Y92121 Bathroom in nursing home as the place of occurrence of the external cause: Secondary | ICD-10-CM | POA: Diagnosis not present

## 2017-02-17 DIAGNOSIS — S0992XA Unspecified injury of nose, initial encounter: Secondary | ICD-10-CM | POA: Diagnosis not present

## 2017-02-17 DIAGNOSIS — R220 Localized swelling, mass and lump, head: Secondary | ICD-10-CM | POA: Diagnosis not present

## 2017-02-17 NOTE — ED Notes (Signed)
This Probation officer spoke with Dede at [facility] who states that there is transportation available and will send.

## 2017-02-17 NOTE — Discharge Instructions (Signed)
You have bruising to your face after fall, no serious injury is suspected.  As we discussed, please return to the department immediately for any worsening pain, trouble breathing, weakness, numbness, or any other symptoms concerning to you.

## 2017-02-17 NOTE — ED Triage Notes (Signed)
Pt arrives via [facility] from [facility] for a fall yest. Pt states she was in wheelchair, ems stated she was in the bathroom. Pt hit nose on knees. Nose is red and swollen. Appears deformed to this RN. Pt states she did not hit head or pass out. Pt is c/o of diff breathing through nose d/t swelling. Pt talks very quietly and is parkinsons pt. Ems reports pt ambulatory on scene.

## 2017-02-17 NOTE — ED Notes (Signed)
Spoke with Kaitlyn Church, who states that pt has no way to get home due to both of her contact being at work. Spoke with [place] at [facility] who states that there is no transportation available.

## 2017-02-17 NOTE — ED Provider Notes (Signed)
[facility] Emergency Department Provider Note ____________________________________________   I have reviewed the triage vital signs and the triage nursing note.  HISTORY  Chief Complaint Fall   Historian Patient  HPI Kaitlyn Church is a 76 y.o. female from [place], history of multiple autoimmune disease, states that she typically gets around in a wheelchair, was in the bathroom and fell out of the wheelchair striking her bridge of the nose. Denies loss of consciousness. States this is happened before but denies any chest pain, true syncope, recent illnesses, or any neurologic focal findings.  She is not concerned at all about falling out of a wheelchair, and wanted evaluation for the facial injury.  Mild discomfort to the bridge of her nose with there is obvious swelling. No vision changes. No neck pain. No chest pain. No abdominal pain. States that she has mild chronic right hip pain, but no worse.    Past Medical History:  Diagnosis Date  . Abdominal pain, right upper quadrant   . Allergic rhinitis   . Anxiety   . Depression   . Esophageal motility disorder   . Fibromyalgia   . Gait disturbance   . GERD (gastroesophageal reflux disease)   . Grief reaction   . Hemorrhoids, internal, thrombosed   . Interstitial cystitis   . Irritable bowel syndrome   . Osteoporosis   . Parkinson disease ([place])   . Restless leg syndrome   . Sjogren's syndrome ([place])    ?  Marland Kitchen Trochanteric bursitis    bilateral  . Vaginitis     Patient Active Problem List   Diagnosis Date Noted  . Arthritis 06/01/2016  . B12 deficiency 06/01/2016  . Osteoporosis, post-menopausal 06/01/2016  . Parkinson disease ([place]) 06/01/2016  . Sjogren's syndrome ([place]) 06/01/2016  . Chronic cystitis 06/01/2016  . Dysuria 04/16/2016  . Recurrent major depressive disorder, in partial remission ([place]) 01/07/2016  . Chronic constipation 09/24/2014  . Dysphagia 09/24/2014  . Raynaud's  phenomenon without gangrene 09/24/2014  . [place] 04/20/2013  . Bilateral dry eyes 03/06/2012  . Blepharitis of both eyes 03/06/2012  . MGD (meibomian gland dysfunction) 03/06/2012  . DYSPHAGIA PHARYNGEAL PHASE 07/21/2009  . ESOPHAGEAL MOTILITY DISORDER 02/03/2009  . FIBROMYALGIA 02/03/2009  . TROCHANTERIC BURSITIS, BILATERAL 06/21/2008  . HAND PAIN 06/21/2008  . GAIT DISTURBANCE 06/21/2008  . ABDOMINAL, RIGHT UPPER, PAIN 06/21/2008  . GRIEF REACTION 05/30/2008  . VAGINITIS 05/30/2008  . OSTEOPOROSIS 05/30/2008  . HEMORRHOIDS, INTERNAL, THROMBOSED 03/22/2008  . CHANGE IN BOWELS 03/22/2008  . FATIGUE 03/10/2007  . RESTLESS LEG SYNDROME 02/19/2007  . Irritable bowel syndrome 02/19/2007  . ANXIETY 02/16/2007  . DEPRESSION 02/16/2007  . ALLERGIC RHINITIS 02/16/2007  . GERD 02/16/2007  . INTERSTITIAL CYSTITIS 02/16/2007  . [place] SYNDROME 02/16/2007  . FIBROSITIS 02/16/2007    Past Surgical History:  Procedure Laterality Date  . ABDOMINAL HYSTERECTOMY  10/2002  . APPENDECTOMY  11/1969  . CYSTOCELE REPAIR  1980  . ESOPHAGOGASTRODUODENOSCOPY  01/2001   nl  . ESOPHAGOGASTRODUODENOSCOPY  09/2006   Normal Carlean Purl)  . EXPLORATORY LAPAROTOMY  09/1969  . HERNIA REPAIR  1980  . NASAL SEPTUM SURGERY  11/1976  . POLYPECTOMY  03/1972   Bladder  . [place]  . TONSILLECTOMY  1948    Prior to Admission medications   Medication Sig Start Date End Date Taking? Authorizing Provider  acetaminophen (TYLENOL) 500 MG tablet Take 500 mg by mouth every 4 (four) hours as needed.    [provider]  ALPRAZolam (XANAX) 0.5 MG tablet Take 0.5 mg by mouth 4 (four) times daily.    [provider]  alum & mag hydroxide-simeth (MAALOX/MYLANTA) 200-200-20 MG/5ML suspension Take 30 mLs by mouth every 6 (six) hours as needed for indigestion or heartburn.    [provider]  bisacodyl (DULCOLAX) 10 MG suppository Place 10 mg rectally daily as needed for  moderate constipation.    [provider]  carbidopa-levodopa (SINEMET IR) 25-100 MG tablet  05/11/16   [provider]  Carbidopa-Levodopa ER (SINEMET CR) 25-100 MG tablet controlled release Take 1.5 tablets by mouth 3 (three) times daily.    [provider]  desonide (DESOWEN) 0.05 % cream Apply 1 application topically 2 (two) times daily as needed.     [provider]  dimenhyDRINATE (DRAMAMINE) 50 MG tablet Take 50 mg by mouth every 4 (four) hours as needed.    [provider]  estrogens, conjugated, (PREMARIN) 0.625 MG tablet Take 0.625 mg by mouth daily. Take daily for 21 days then do not take for 7 days.     [provider]  FLUoxetine (PROZAC) 10 MG tablet Take 5 mg by mouth daily.      [provider]  Homeopathic Products (ARNICARE ARNICA EX) Apply 1 application topically daily as needed.    [provider]  ibuprofen (ADVIL,MOTRIN) 200 MG tablet Take 200 mg by mouth 2 (two) times daily.    [provider]  Lifitegrast Shirley Friar) 5 % SOLN Apply 1 drop to eye 2 (two) times daily.    [provider]  loperamide (IMODIUM) 2 MG capsule Take 2 mg by mouth as needed for diarrhea or loose stools.    [provider]  loratadine (CLARITIN) 10 MG tablet Take 10 mg by mouth daily as needed.     [provider]  magnesium hydroxide (MILK OF MAGNESIA) 400 MG/5ML suspension Take 30 mLs by mouth at bedtime as needed for mild constipation.    [provider]  magnesium oxide (MAG-OX) 400 MG tablet Take 400 mg by mouth at bedtime as needed.    [provider]  Meth-Hyo-M Bl-Na Phos-Ph Sal (URIBEL) 118 MG CAPS Take by mouth.    [provider]  phenazopyridine (PYRIDIUM) 95 MG tablet Take 95 mg by mouth 4 (four) times daily. After each meal     [provider]  Polyethyl Glycol-Propyl Glycol (SYSTANE) 0.4-0.3 % GEL ophthalmic gel Place 1 application into both eyes 4  (four) times daily.    [provider]  Wheat Dextrin (BENEFIBER) POWD Take 10 mLs by mouth 2 (two) times daily as needed.    [provider]    Allergies  Allergen Reactions  . Lidocaine Other (See Comments)  . Other Other (See Comments)    darvocet. darvocet - HALLUCINATIONS  . Propoxyphene Other (See Comments)    Other Reaction: hallucinations, weird dreams  . Ascorbic Acid Other (See Comments)  . Azithromycin Other (See Comments)    Other Reaction: sore, swollen tongue  . Basil Oil Other (See Comments)  . Calcium Other (See Comments)  . Celecoxib Other (See Comments)  . Cephalexin     REACTION: felt tired but tolerated  . Dexamethasone Other (See Comments)    Other Reaction: insomnia, chest spasms  . Doxycycline Hives  . Itraconazole Other (See Comments)  . Meperidine Hcl   . Meperidine Hcl Other (See Comments)  . Mirabegron Other (See Comments)    intolerant  . Morphine  Hives  . Morphine Sulfate   . Origanum Oil Other (See Comments)  . Pantoprazole Sodium     REACTION: diarrhea  . Pantoprazole Sodium Other (See Comments)    REACTION: diarrhea  . Penicillins Other (See Comments)  . Propoxyphene Hcl   . Sulfa Antibiotics Other (See Comments)    Swollen lip and tongue Blisters on tongue  . Sulfamethoxazole-Trimethoprim Other (See Comments)  . Thimerosal Other (See Comments)    Eye burning    Family History  Problem Relation Age of Onset  . Breast cancer Mother   . Alcohol abuse Father   . Pneumonia Father   . Bipolar disorder Son   . Coronary artery disease Neg Hx   . Diabetes Neg Hx   . Hypertension Neg Hx   . Colon cancer Neg Hx   . Prostate cancer Neg Hx   . Kidney cancer Neg Hx     Social History Social History  Substance Use Topics  . Smoking status: Former Research scientist (life sciences)  . Smokeless tobacco: Never Used  . Alcohol use No    Review of Systems  Constitutional: Negative for fever. Eyes: Negative for visual changes. ENT: Negative for  sore throat. Cardiovascular: Negative for chest pain. Respiratory: Negative for shortness of breath. Gastrointestinal: Negative for abdominal pain, vomiting and diarrhea. Genitourinary: Negative for dysuria. Musculoskeletal: Negative for back pain. Skin: Negative for rash. Neurological: Negative for headache.  ____________________________________________   PHYSICAL EXAM:  VITAL SIGNS: ED Triage Vitals  Enc Vitals Group     BP 02/17/17 1118 (!) 153/70     Pulse Rate 02/17/17 1115 71     Resp 02/17/17 1115 18     Temp 02/17/17 1115 97.7 F (36.5 C)     Temp Source 02/17/17 1115 Oral     SpO2 02/17/17 1115 97 %     Weight --      Height --      Head Circumference --      Peak Flow --      Pain Score 02/17/17 1114 5     Pain Loc --      Pain Edu? --      Excl. in [place]? --      Constitutional: Alert and oriented. Very soft voice. Well appearing and in no distress.  Thin and frail, but very well dressed and put together. HEENT   Head: Normocephalic.  Early ecchymosis right eyelid and right cheekbone. She has moderate ecchymosis and swelling across the bridge of her nose. No nasal septal hematoma or nosebleed. No jaw malocclusion.      Eyes: Conjunctivae are normal. Pupils equal and round.       Ears:         Nose: No congestion/rhinnorhea.   Mouth/Throat: Mucous membranes are moist.   Neck: No stridor.  Nontender C-spine to palpation and range of motion. Cardiovascular/Chest: Normal rate, regular rhythm.  No murmurs, rubs, or gallops. Respiratory: Normal respiratory effort without tachypnea nor retractions. Breath sounds are clear and equal bilaterally. No wheezes/rales/rhonchi. Gastrointestinal: Soft. No distention, no guarding, no rebound. Nontender.    Genitourinary/rectal:Deferred Musculoskeletal: Nontender with normal range of motion in all extremities. No joint effusions.  No lower extremity tenderness.  No edema.  Mild soreness of the right hip without any focal  tenderness especially with loading. Neurologic:  Normal speech and language. No gross or focal neurologic deficits are appreciated. Skin:  Skin is warm, dry and intact. No rash noted. Psychiatric: Mood and affect are normal. Speech and  behavior are normal. Patient exhibits appropriate insight and judgment.   ____________________________________________  LABS (pertinent positives/negatives)  Labs Reviewed - No data to display  ____________________________________________    EKG I, Lisa Roca, MD, the attending physician have personally viewed and interpreted all ECGs.  None ____________________________________________  RADIOLOGY All Xrays were viewed by me. Imaging interpreted by Radiologist.  CT head and maxillofacial without contrast: IMPRESSION: 1. No acute intracranial pathology. 2. No acute osseous injury of the maxillofacial bones.  __________________________________________  PROCEDURES  Procedure(s) performed: None  Critical Care performed: None  ____________________________________________   ED COURSE / ASSESSMENT AND PLAN  Pertinent labs & imaging results that were available during my care of the patient were reviewed by me and considered in my medical decision making (see chart for details).    Ms. Vasallo has no intracranial or maxillofacial fractures or acute rheumatic findings on CT head and maxillofacial. Pelvic this was simply mechanical fall, not related to syncope. Patient states this is common for her. We discussed whether or not to pursue any additional medical evaluation and does not seem like that that is necessary at this point in time.  No other serious injuries suspected based on clinical exam. Patient states she did not want x-ray of the right hip and that her pain in the right hip was essentially chronic.    CONSULTATIONS:   None  Patient / Family / Caregiver informed of clinical course, medical decision-making process, and agree with  plan.   I discussed return precautions, follow-up instructions, and discharge instructions with patient and/or family.  Discharge Instructions : You have bruising to your face after fall, no serious injury is suspected.  As we discussed, please return to the department immediately for any worsening pain, trouble breathing, weakness, numbness, or any other symptoms concerning to you.  ___________________________________________   FINAL CLINICAL IMPRESSION(S) / ED DIAGNOSES   Final diagnoses:  Contusion of face, initial encounter              Note: This dictation was prepared with Dragon dictation. Any transcriptional errors that result from this process are unintentional    Lisa Roca, MD 02/17/17 1327

## 2017-02-17 NOTE — ED Notes (Signed)
Pt in CT via stretcher  

## 2017-02-28 ENCOUNTER — Encounter: Payer: Self-pay | Admitting: Urology

## 2017-02-28 ENCOUNTER — Ambulatory Visit: Payer: PPO | Admitting: Urology

## 2017-02-28 VITALS — BP 123/69 | HR 76 | Ht 65.0 in

## 2017-02-28 DIAGNOSIS — N3946 Mixed incontinence: Secondary | ICD-10-CM

## 2017-02-28 DIAGNOSIS — R300 Dysuria: Secondary | ICD-10-CM

## 2017-02-28 NOTE — Progress Notes (Signed)
02/28/2017 3:07 PM   Kaitlyn Church 1941/03/25 [id number]  Referring provider: Adin Hector, MD [facility] [place], [place] [address]  Chief Complaint  Patient presents with  . Medication Refill    HPI: [name] is a 76yo here for followup for chronic cystitis. She was treated with vantin for a UTI whch resolved her dysuria and urgency. She is very happy with uribel    Last visit The patient is well known to myself from [place]. She has positive and negative cultures documented shown to me by her family. Cultures sent and she was doing well on the uribel  Today Frequency is stable Clinically noninfected  The uribel still works very well twice a day. They need a prescription to take 6 the morning and 6 at night. She has a little bit of breakthrough symptoms. Her daughter continues get cultures once or twice at the 7 times they've been positive in the last year or so they are very good at getting the cultures now    PMH: Past Medical History:  Diagnosis Date  . Abdominal pain, right upper quadrant   . Allergic rhinitis   . Anxiety   . Depression   . Esophageal motility disorder   . Fibromyalgia   . Gait disturbance   . GERD (gastroesophageal reflux disease)   . Grief reaction   . Hemorrhoids, internal, thrombosed   . Interstitial cystitis   . Irritable bowel syndrome   . Osteoporosis   . Parkinson disease ([place])   . Restless leg syndrome   . Sjogren's syndrome ([place])    ?  Marland Kitchen Trochanteric bursitis    bilateral  . Vaginitis     Surgical History: Past Surgical History:  Procedure Laterality Date  . ABDOMINAL HYSTERECTOMY  10/2002  . APPENDECTOMY  11/1969  . CYSTOCELE REPAIR  1980  . ESOPHAGOGASTRODUODENOSCOPY  01/2001   nl  . ESOPHAGOGASTRODUODENOSCOPY  09/2006   Normal Carlean Purl)  . EXPLORATORY LAPAROTOMY  09/1969  . HERNIA REPAIR  1980  . NASAL SEPTUM SURGERY  11/1976  . POLYPECTOMY  03/1972   Bladder  .  [place]  . TONSILLECTOMY  1948    Home Medications:  Allergies as of 02/28/2017      Reactions   Lidocaine Other (See Comments)   Other Other (See Comments)   darvocet. darvocet - HALLUCINATIONS   Propoxyphene Other (See Comments)   Other Reaction: hallucinations, weird dreams   Ascorbic Acid Other (See Comments)   Azithromycin Other (See Comments)   Other Reaction: sore, swollen tongue   Basil Oil Other (See Comments)   Calcium Other (See Comments)   Celecoxib Other (See Comments)   Cephalexin    REACTION: felt tired but tolerated   Dexamethasone Other (See Comments)   Other Reaction: insomnia, chest spasms   Doxycycline Hives   Itraconazole Other (See Comments)   Meperidine Hcl    Meperidine Hcl Other (See Comments)   Mirabegron Other (See Comments)   intolerant   Morphine Hives   Morphine Sulfate    Origanum Oil Other (See Comments)   Pantoprazole Sodium    REACTION: diarrhea   Pantoprazole Sodium Other (See Comments)   REACTION: diarrhea   Penicillins Other (See Comments)   Propoxyphene Hcl    Sulfa Antibiotics Other (See Comments)   Swollen lip and tongue Blisters on tongue   Sulfamethoxazole-trimethoprim Other (See Comments)   Thimerosal Other (See Comments)   Eye burning  Medication List       Accurate as of 02/28/17  3:07 PM. Always use your most recent med list.          acetaminophen 500 MG tablet Commonly known as:  TYLENOL Take 500 mg by mouth every 4 (four) hours as needed.   ALPRAZolam 0.5 MG tablet Commonly known as:  XANAX Take 0.5 mg by mouth 4 (four) times daily.   alum & mag hydroxide-simeth 200-200-20 MG/5ML suspension Commonly known as:  MAALOX/MYLANTA Take 30 mLs by mouth every 6 (six) hours as needed for indigestion or heartburn.   ARNICARE ARNICA EX Apply 1 application topically daily as needed.   BENEFIBER Powd Take 10 mLs by mouth 2 (two) times daily as needed.   bisacodyl 10 MG suppository Commonly known  as:  DULCOLAX Place 10 mg rectally daily as needed for moderate constipation.   Carbidopa-Levodopa ER 25-100 MG tablet controlled release Commonly known as:  SINEMET CR Take 1.5 tablets by mouth 3 (three) times daily.   carbidopa-levodopa 25-100 MG tablet Commonly known as:  SINEMET IR   desonide 0.05 % cream Commonly known as:  DESOWEN Apply 1 application topically 2 (two) times daily as needed.   dimenhyDRINATE 50 MG tablet Commonly known as:  DRAMAMINE Take 50 mg by mouth every 4 (four) hours as needed.   estrogens (conjugated) 0.625 MG tablet Commonly known as:  PREMARIN Take 0.625 mg by mouth daily. Take daily for 21 days then do not take for 7 days.   FLUoxetine 10 MG tablet Commonly known as:  PROZAC Take 5 mg by mouth daily.   ibuprofen 200 MG tablet Commonly known as:  ADVIL,MOTRIN Take 200 mg by mouth 2 (two) times daily.   loperamide 2 MG capsule Commonly known as:  IMODIUM Take 2 mg by mouth as needed for diarrhea or loose stools.   loratadine 10 MG tablet Commonly known as:  CLARITIN Take 10 mg by mouth daily as needed.   magnesium hydroxide 400 MG/5ML suspension Commonly known as:  MILK OF MAGNESIA Take 30 mLs by mouth at bedtime as needed for mild constipation.   magnesium oxide 400 MG tablet Commonly known as:  MAG-OX Take 400 mg by mouth at bedtime as needed.   phenazopyridine 95 MG tablet Commonly known as:  PYRIDIUM Take 95 mg by mouth 4 (four) times daily. After each meal   SYSTANE 0.4-0.3 % Gel ophthalmic gel Generic drug:  Polyethyl Glycol-Propyl Glycol Place 1 application into both eyes 4 (four) times daily.   URIBEL 118 MG Caps Take by mouth.   XIIDRA 5 % Soln Generic drug:  Lifitegrast Apply 1 drop to eye 2 (two) times daily.       Allergies:  Allergies  Allergen Reactions  . Lidocaine Other (See Comments)  . Other Other (See Comments)    darvocet. darvocet - HALLUCINATIONS  . Propoxyphene Other (See Comments)    Other  Reaction: hallucinations, weird dreams  . Ascorbic Acid Other (See Comments)  . Azithromycin Other (See Comments)    Other Reaction: sore, swollen tongue  . Basil Oil Other (See Comments)  . Calcium Other (See Comments)  . Celecoxib Other (See Comments)  . Cephalexin     REACTION: felt tired but tolerated  . Dexamethasone Other (See Comments)    Other Reaction: insomnia, chest spasms  . Doxycycline Hives  . Itraconazole Other (See Comments)  . Meperidine Hcl   . Meperidine Hcl Other (See Comments)  . Mirabegron Other (See Comments)  intolerant  . Morphine Hives  . Morphine Sulfate   . Origanum Oil Other (See Comments)  . Pantoprazole Sodium     REACTION: diarrhea  . Pantoprazole Sodium Other (See Comments)    REACTION: diarrhea  . Penicillins Other (See Comments)  . Propoxyphene Hcl   . Sulfa Antibiotics Other (See Comments)    Swollen lip and tongue Blisters on tongue  . Sulfamethoxazole-Trimethoprim Other (See Comments)  . Thimerosal Other (See Comments)    Eye burning    Family History: Family History  Problem Relation Age of Onset  . Breast cancer Mother   . Alcohol abuse Father   . Pneumonia Father   . Bipolar disorder Son   . Coronary artery disease Neg Hx   . Diabetes Neg Hx   . Hypertension Neg Hx   . Colon cancer Neg Hx   . Prostate cancer Neg Hx   . Kidney cancer Neg Hx     Social History:  reports that she has quit smoking. She has never used smokeless tobacco. She reports that she does not drink alcohol or use drugs.  ROS: UROLOGY Frequent Urination?: No Hard to postpone urination?: Yes Burning/pain with urination?: Yes Get up at night to urinate?: No Leakage of urine?: Yes Urine stream starts and stops?: No Trouble starting stream?: No Do you have to strain to urinate?: Yes Blood in urine?: No Urinary tract infection?: No Sexually transmitted disease?: No Injury to kidneys or bladder?: No Painful intercourse?: No Weak stream?:  No Currently pregnant?: No Vaginal bleeding?: No Last menstrual period?: n  Gastrointestinal Nausea?: No Vomiting?: No Indigestion/heartburn?: No Diarrhea?: No Constipation?: Yes  Constitutional Fever: No Night sweats?: No Weight loss?: No Fatigue?: No  Skin Skin rash/lesions?: No Itching?: No  Eyes Blurred vision?: No Double vision?: No  Ears/Nose/Throat Sore throat?: No Sinus problems?: No  Hematologic/Lymphatic Swollen glands?: No Easy bruising?: No  Cardiovascular Leg swelling?: No Chest pain?: No  Respiratory Cough?: No Shortness of breath?: No  Endocrine Excessive thirst?: No  Musculoskeletal Back pain?: No Joint pain?: No  Neurological Headaches?: No Dizziness?: Yes  Psychologic Depression?: Yes Anxiety?: Yes  Physical Exam: BP 123/69 (BP Location: Left Arm, Patient Position: Sitting, Cuff Size: Normal)   Pulse 76   Ht 5\' 5"  (1.651 m)   Constitutional:  Alert and oriented, No acute distress.  Laboratory Data: Lab Results  Component Value Date   WBC 5.3 04/05/2016   HGB 12.1 04/05/2016   HCT 36.0 04/05/2016   MCV 93.4 04/05/2016   PLT 259 04/05/2016    Lab Results  Component Value Date   CREATININE 0.55 04/05/2016    No results found for: PSA  No results found for: TESTOSTERONE  No results found for: HGBA1C  Urinalysis    Component Value Date/Time   COLORURINE AMBER (A) 04/05/2016 1614   APPEARANCEUR CLEAR (A) 04/05/2016 1614   LABSPEC 1.014 04/05/2016 1614   PHURINE 7.0 04/05/2016 1614   GLUCOSEU NEGATIVE 04/05/2016 1614   HGBUR NEGATIVE 04/05/2016 1614   BILIRUBINUR NEGATIVE 04/05/2016 1614   KETONESUR NEGATIVE 04/05/2016 1614   PROTEINUR NEGATIVE 04/05/2016 1614   NITRITE POSITIVE (A) 04/05/2016 1614   LEUKOCYTESUR 2+ (A) 04/05/2016 1614    Pertinent Imaging: None   Assessment & Plan:  Prescription given with 11 refills and I will see her in 1 year  There are no diagnoses linked to this encounter.  No  Follow-up on file.  Reece Packer, MD  [facility] [address]  [place], [place] [address] [phone]

## 2017-03-18 DIAGNOSIS — E538 Deficiency of other specified B group vitamins: Secondary | ICD-10-CM | POA: Diagnosis not present

## 2017-04-05 ENCOUNTER — Emergency Department
Admission: EM | Admit: 2017-04-05 | Discharge: 2017-04-05 | Disposition: A | Discharge status: Nursing Home (Includes ICF) | Payer: PPO | Attending: Emergency Medicine | Admitting: Emergency Medicine

## 2017-04-05 ENCOUNTER — Encounter: Payer: Self-pay | Admitting: Emergency Medicine

## 2017-04-05 ENCOUNTER — Emergency Department: Payer: PPO

## 2017-04-05 DIAGNOSIS — Z743 Need for continuous supervision: Secondary | ICD-10-CM | POA: Diagnosis not present

## 2017-04-05 DIAGNOSIS — S0990XA Unspecified injury of head, initial encounter: Secondary | ICD-10-CM

## 2017-04-05 DIAGNOSIS — Z87891 Personal history of nicotine dependence: Secondary | ICD-10-CM | POA: Diagnosis not present

## 2017-04-05 DIAGNOSIS — S0993XA Unspecified injury of face, initial encounter: Secondary | ICD-10-CM | POA: Diagnosis not present

## 2017-04-05 DIAGNOSIS — Y92129 Unspecified place in nursing home as the place of occurrence of the external cause: Secondary | ICD-10-CM | POA: Insufficient documentation

## 2017-04-05 DIAGNOSIS — G20 Parkinson's disease: Secondary | ICD-10-CM | POA: Insufficient documentation

## 2017-04-05 DIAGNOSIS — W19XXXA Unspecified fall, initial encounter: Secondary | ICD-10-CM | POA: Diagnosis not present

## 2017-04-05 DIAGNOSIS — S0083XA Contusion of other part of head, initial encounter: Secondary | ICD-10-CM | POA: Diagnosis not present

## 2017-04-05 DIAGNOSIS — R51 Headache: Secondary | ICD-10-CM | POA: Diagnosis not present

## 2017-04-05 DIAGNOSIS — Y998 Other external cause status: Secondary | ICD-10-CM | POA: Insufficient documentation

## 2017-04-05 DIAGNOSIS — Y939 Activity, unspecified: Secondary | ICD-10-CM | POA: Insufficient documentation

## 2017-04-05 DIAGNOSIS — R6889 Other general symptoms and signs: Secondary | ICD-10-CM | POA: Diagnosis not present

## 2017-04-05 LAB — CBC WITH DIFFERENTIAL/PLATELET
BASOS ABS: 0 10*3/uL (ref 0–0.1)
BASOS PCT: 1 %
Eosinophils Absolute: 0 10*3/uL (ref 0–0.7)
Eosinophils Relative: 1 %
HEMATOCRIT: 38.7 % (ref 35.0–47.0)
Hemoglobin: 13 g/dL (ref 12.0–16.0)
Lymphocytes Relative: 21 %
Lymphs Abs: 1 10*3/uL (ref 1.0–3.6)
MCH: 30.9 pg (ref 26.0–34.0)
MCHC: 33.6 g/dL (ref 32.0–36.0)
MCV: 91.9 fL (ref 80.0–100.0)
MONOS PCT: 8 %
Monocytes Absolute: 0.4 10*3/uL (ref 0.2–0.9)
NEUTROS ABS: 3.4 10*3/uL (ref 1.4–6.5)
Neutrophils Relative %: 69 %
PLATELETS: 224 10*3/uL (ref 150–440)
RBC: 4.21 MIL/uL (ref 3.80–5.20)
RDW: 12.8 % (ref 11.5–14.5)
WBC: 4.9 10*3/uL (ref 3.6–11.0)

## 2017-04-05 LAB — TROPONIN I: Troponin I: <0.03 ng/mL (ref <0.03)

## 2017-04-05 LAB — URINALYSIS, COMPLETE (UACMP) WITH MICROSCOPIC
Bilirubin Urine: NEGATIVE
GLUCOSE, UA: NEGATIVE mg/dL
KETONES UR: 5 mg/dL — AB
NITRITE: NEGATIVE
PH: 6 (ref 5.0–8.0)
PROTEIN: NEGATIVE mg/dL
Specific Gravity, Urine: 1.008 (ref 1.005–1.030)

## 2017-04-05 LAB — COMPREHENSIVE METABOLIC PANEL
ALT: <5 U/L — ABNORMAL LOW (ref 14–54)
ANION GAP: 6 (ref 5–15)
AST: 15 U/L (ref 15–41)
Albumin: 3.7 g/dL (ref 3.5–5.0)
Alkaline Phosphatase: 70 U/L (ref 38–126)
BILIRUBIN TOTAL: 0.4 mg/dL (ref 0.3–1.2)
BUN: 16 mg/dL (ref 6–20)
CHLORIDE: 105 mmol/L (ref 101–111)
CO2: 26 mmol/L (ref 22–32)
Calcium: 8.7 mg/dL — ABNORMAL LOW (ref 8.9–10.3)
Creatinine, Ser: 0.57 mg/dL (ref 0.44–1.00)
Glucose, Bld: 93 mg/dL (ref 65–99)
POTASSIUM: 4.4 mmol/L (ref 3.5–5.1)
Sodium: 137 mmol/L (ref 135–145)
TOTAL PROTEIN: 6.1 g/dL — AB (ref 6.5–8.1)

## 2017-04-05 LAB — GLUCOSE, CAPILLARY: Glucose-Capillary: 79 mg/dL (ref 65–99)

## 2017-04-05 NOTE — ED Provider Notes (Signed)
[facility] Emergency Department Provider Note       Time seen: ----------------------------------------- 10:18 AM on 04/05/2017 -----------------------------------------     I have reviewed the triage vital signs and the nursing notes.   HISTORY   Chief Complaint Fall    HPI Kaitlyn Church is a 76 y.o. female who presents to the ED for multiple falls. Patient arrives from [place] by ambulance for a witnessed fall. Patient reports she has fallen around 4 times since yesterday. She presents with scattered bruising particularly over her face and around her eyes. She arrives alert and oriented and was able to stand and pivot prior to my evaluation.   Past Medical History:  Diagnosis Date  . Abdominal pain, right upper quadrant   . Allergic rhinitis   . Anxiety   . Depression   . Esophageal motility disorder   . Fibromyalgia   . Gait disturbance   . GERD (gastroesophageal reflux disease)   . Grief reaction   . Hemorrhoids, internal, thrombosed   . Interstitial cystitis   . Irritable bowel syndrome   . Osteoporosis   . Parkinson disease ([place])   . Restless leg syndrome   . Sjogren's syndrome ([place])    ?  Marland Kitchen Trochanteric bursitis    bilateral  . Vaginitis     Patient Active Problem List   Diagnosis Date Noted  . Arthritis 06/01/2016  . B12 deficiency 06/01/2016  . Osteoporosis, post-menopausal 06/01/2016  . Parkinson disease ([place]) 06/01/2016  . Sjogren's syndrome ([place]) 06/01/2016  . Chronic cystitis 06/01/2016  . Dysuria 04/16/2016  . Recurrent major depressive disorder, in partial remission ([place]) 01/07/2016  . Chronic constipation 09/24/2014  . Dysphagia 09/24/2014  . Raynaud's phenomenon without gangrene 09/24/2014  . [place] 04/20/2013  . Bilateral dry eyes 03/06/2012  . Blepharitis of both eyes 03/06/2012  . MGD (meibomian gland dysfunction) 03/06/2012  . DYSPHAGIA PHARYNGEAL PHASE 07/21/2009  . ESOPHAGEAL MOTILITY  DISORDER 02/03/2009  . FIBROMYALGIA 02/03/2009  . TROCHANTERIC BURSITIS, BILATERAL 06/21/2008  . HAND PAIN 06/21/2008  . GAIT DISTURBANCE 06/21/2008  . ABDOMINAL, RIGHT UPPER, PAIN 06/21/2008  . GRIEF REACTION 05/30/2008  . VAGINITIS 05/30/2008  . OSTEOPOROSIS 05/30/2008  . HEMORRHOIDS, INTERNAL, THROMBOSED 03/22/2008  . CHANGE IN BOWELS 03/22/2008  . FATIGUE 03/10/2007  . RESTLESS LEG SYNDROME 02/19/2007  . Irritable bowel syndrome 02/19/2007  . ANXIETY 02/16/2007  . DEPRESSION 02/16/2007  . ALLERGIC RHINITIS 02/16/2007  . GERD 02/16/2007  . INTERSTITIAL CYSTITIS 02/16/2007  . [place] SYNDROME 02/16/2007  . FIBROSITIS 02/16/2007    Past Surgical History:  Procedure Laterality Date  . ABDOMINAL HYSTERECTOMY  10/2002  . APPENDECTOMY  11/1969  . CYSTOCELE REPAIR  1980  . ESOPHAGOGASTRODUODENOSCOPY  01/2001   nl  . ESOPHAGOGASTRODUODENOSCOPY  09/2006   Normal Carlean Purl)  . EXPLORATORY LAPAROTOMY  09/1969  . HERNIA REPAIR  1980  . NASAL SEPTUM SURGERY  11/1976  . POLYPECTOMY  03/1972   Bladder  . [place]  . TONSILLECTOMY  1948    Allergies Lidocaine; Other; Propoxyphene; Ascorbic acid; Azithromycin; Basil oil; Calcium; Celecoxib; Cephalexin; Dexamethasone; Doxycycline; Itraconazole; Meperidine hcl; Meperidine hcl; Mirabegron; Morphine; Morphine sulfate; Origanum oil; Pantoprazole sodium; Pantoprazole sodium; Penicillins; Propoxyphene hcl; Sulfa antibiotics; Sulfamethoxazole-trimethoprim; and Thimerosal  Social History Social History  Substance Use Topics  . Smoking status: Former Research scientist (life sciences)  . Smokeless tobacco: Never Used  . Alcohol use No    Review of Systems Constitutional: Negative for fever. Cardiovascular: Negative for chest pain. Respiratory: Negative  for shortness of breath. Gastrointestinal: Negative for abdominal pain, vomiting and diarrhea. Genitourinary: Negative for dysuria. Musculoskeletal: Negative for back pain. Skin: Positive for  bruising Neurological: Negative for headaches, focal weakness or numbness.  All systems negative/normal/unremarkable except as stated in the HPI  ____________________________________________   PHYSICAL EXAM:  VITAL SIGNS: ED Triage Vitals [04/05/17 1016]  Enc Vitals Group     BP      Pulse      Resp      Temp      Temp src      SpO2      Weight 112 lb (50.8 kg)     Height 5\' 5"  (1.651 m)     Head Circumference      Peak Flow      Pain Score 7     Pain Loc      Pain Edu?      Excl. in [place]?     Constitutional: Alert and oriented. Well appearing and in no distress. Eyes: Conjunctivae are normal. Normal extraocular movements. ENT   Head: Normocephalic, scattered facial contusions and erythema of various age particularly over the nose and around both eyes   Nose: No congestion/rhinnorhea. Swelling across the nasal bridge   Mouth/Throat: Mucous membranes are moist.   Neck: No stridor. Cardiovascular: Normal rate, regular rhythm. No murmurs, rubs, or gallops. Respiratory: Normal respiratory effort without tachypnea nor retractions. Breath sounds are clear and equal bilaterally. No wheezes/rales/rhonchi. Gastrointestinal: Soft and nontender. Normal bowel sounds Musculoskeletal: Nontender with normal range of motion in extremities. No lower extremity tenderness nor edema. Neurologic:  Normal speech and language. No gross focal neurologic deficits are appreciated.  Skin:  Facial contusions predominantly, scattered contusions of the extremities are noted Psychiatric: Mood and affect are normal. Speech and behavior are normal.  ____________________________________________  ED COURSE:  Pertinent labs & imaging results that were available during my care of the patient were reviewed by me and considered in my medical decision making (see chart for details). Patient presents for frequent falls, we will assess with labs and imaging as indicated.    Procedures ____________________________________________   LABS (pertinent positives/negatives)  Labs Reviewed  COMPREHENSIVE METABOLIC PANEL - Abnormal; Notable for the following:       Result Value   Calcium 8.7 (*)    Total Protein 6.1 (*)    ALT <5 (*)    All other components within normal limits  CBC WITH DIFFERENTIAL/PLATELET  TROPONIN I  GLUCOSE, CAPILLARY  URINALYSIS, COMPLETE (UACMP) WITH MICROSCOPIC  CBG MONITORING, ED    RADIOLOGY Images were viewed by me  CT head, maxillofacial IMPRESSION: 1. No acute intracranial abnormality. 2. No acute facial fracture. ____________________________________________  FINAL ASSESSMENT AND PLAN  Fall, head injury, Contusion  Plan: Patient's labs and imaging were dictated above. Patient had presented for a fall that occurred at [place]. She is stable for outpatient follow-up with no fracture is evident on CT. Labs are otherwise reassuring.   Earleen Newport, MD   Note: This note was generated in part or whole with voice recognition software. Voice recognition is usually quite accurate but there are transcription errors that can and very often do occur. I apologize for any typographical errors that were not detected and corrected.     Earleen Newport, MD 04/05/17 1224

## 2017-04-05 NOTE — ED Triage Notes (Signed)
Pt here from [place] house via [facility] after a witnessed fall, pt with bruising to top of head, face and right elbow. Pt has some swelling noted to bilateral eyes, airway intact. Pt with soft speech, reports normal. Pt A&O.

## 2017-04-05 NOTE — ED Notes (Signed)
Pt in CT.

## 2017-04-05 NOTE — ED Notes (Signed)
Patient up to restroom with assistance. Patient unsteady on feet but reports this is her baseline. Specimen obtained and sent to lab.

## 2017-04-18 DIAGNOSIS — E538 Deficiency of other specified B group vitamins: Secondary | ICD-10-CM | POA: Diagnosis not present

## 2017-04-21 ENCOUNTER — Emergency Department
Admission: EM | Admit: 2017-04-21 | Discharge: 2017-04-22 | Disposition: A | Discharge status: Skilled Nursing Facility | Payer: PPO | Attending: Student in an Organized Health Care Education/Training Program | Admitting: Student in an Organized Health Care Education/Training Program

## 2017-04-21 DIAGNOSIS — B0223 Postherpetic polyneuropathy: Secondary | ICD-10-CM | POA: Diagnosis not present

## 2017-04-21 DIAGNOSIS — Z87891 Personal history of nicotine dependence: Secondary | ICD-10-CM | POA: Diagnosis not present

## 2017-04-21 DIAGNOSIS — Z79899 Other long term (current) drug therapy: Secondary | ICD-10-CM | POA: Diagnosis not present

## 2017-04-21 DIAGNOSIS — G501 Atypical facial pain: Secondary | ICD-10-CM | POA: Diagnosis present

## 2017-04-21 DIAGNOSIS — H538 Other visual disturbances: Secondary | ICD-10-CM | POA: Insufficient documentation

## 2017-04-21 DIAGNOSIS — H5712 Ocular pain, left eye: Secondary | ICD-10-CM | POA: Diagnosis not present

## 2017-04-21 DIAGNOSIS — G20 Parkinson's disease: Secondary | ICD-10-CM | POA: Diagnosis not present

## 2017-04-21 DIAGNOSIS — B029 Zoster without complications: Secondary | ICD-10-CM | POA: Insufficient documentation

## 2017-04-21 DIAGNOSIS — Z743 Need for continuous supervision: Secondary | ICD-10-CM | POA: Diagnosis not present

## 2017-04-21 HISTORY — DX: Zoster without complications: B02.9

## 2017-04-21 MED ORDER — TETRACAINE HCL 0.5 % OP SOLN
2.0000 [drp] | Freq: Once | OPHTHALMIC | Status: AC
  Administered 2017-04-21: 2 [drp] via OPHTHALMIC
  Filled 2017-04-21: qty 4

## 2017-04-21 MED ORDER — ACETAMINOPHEN 325 MG PO TABS
650.0000 mg | ORAL_TABLET | Freq: Once | ORAL | Status: AC
  Administered 2017-04-21: 650 mg via ORAL
  Filled 2017-04-21: qty 2

## 2017-04-21 MED ORDER — FLUORESCEIN SODIUM 0.6 MG OP STRP
1.0000 | ORAL_STRIP | Freq: Once | OPHTHALMIC | Status: AC
  Administered 2017-04-21: 1 via OPHTHALMIC
  Filled 2017-04-21: qty 1

## 2017-04-21 MED ORDER — BACITRACIN-POLYMYXIN B 500-10000 UNIT/GM OP OINT
TOPICAL_OINTMENT | Freq: Three times a day (TID) | OPHTHALMIC | Status: DC
  Filled 2017-04-21: qty 3.5

## 2017-04-21 MED ORDER — VALACYCLOVIR HCL 500 MG PO TABS
500.0000 mg | ORAL_TABLET | Freq: Every day | ORAL | Status: DC
  Administered 2017-04-21: 500 mg via ORAL
  Filled 2017-04-21: qty 1

## 2017-04-21 MED ORDER — POLYMYXIN B-TRIMETHOPRIM 10000-0.1 UNIT/ML-% OP SOLN
1.0000 [drp] | OPHTHALMIC | Status: DC
  Administered 2017-04-21: 1 [drp] via OPHTHALMIC
  Filled 2017-04-21: qty 10

## 2017-04-21 NOTE — Discharge Instructions (Signed)
Follow up with ophthalmology in the AM.  Follow up with PCP.  Take antibiotics as directed.

## 2017-04-21 NOTE — ED Notes (Addendum)
After speaking with RN at [facility], family was contacted by them and stated they will not be coming to this ED to get pt and to go back to [facility] by EMS. Per facility, Emergency Contact not able to pick her up, states send her back by EMS.  This RN asked pt if they can call emergency contact and pt states no.

## 2017-04-21 NOTE — ED Provider Notes (Signed)
[facility] Emergency Department Provider Note    First MD Initiated Contact with Patient 04/21/17 2204     (approximate)  I have reviewed the triage vital signs and the nursing notes.   HISTORY  Chief Complaint No chief complaint on file.    HPI Kaitlyn Church is a 76 y.o. female presents with chief complaint of left facial pain and burning around her eye. Patient was diagnosed with shingles V1 distribution this morning at her outpatient clinic. She is given prescription for Valtrex.atient was sent and she was back at [facility] was having worsening pain due to report of pain and blurry vision and they're concerned that the she had not had her prescriptions filled. The patient tells me that the family had the Valtrex filled today. She got 1 dose.  Denies any other complaints.   Past Medical History:  Diagnosis Date  . Abdominal pain, right upper quadrant   . Allergic rhinitis   . Anxiety   . Depression   . Esophageal motility disorder   . Fibromyalgia   . Gait disturbance   . GERD (gastroesophageal reflux disease)   . Grief reaction   . Hemorrhoids, internal, thrombosed   . Interstitial cystitis   . Irritable bowel syndrome   . Osteoporosis   . Parkinson disease ([place])   . Restless leg syndrome   . Sjogren's syndrome ([place])    ?  Marland Kitchen Trochanteric bursitis    bilateral  . Vaginitis    Family History  Problem Relation Age of Onset  . Breast cancer Mother   . Alcohol abuse Father   . Pneumonia Father   . Bipolar disorder Son   . Coronary artery disease Neg Hx   . Diabetes Neg Hx   . Hypertension Neg Hx   . Colon cancer Neg Hx   . Prostate cancer Neg Hx   . Kidney cancer Neg Hx    Past Surgical History:  Procedure Laterality Date  . ABDOMINAL HYSTERECTOMY  10/2002  . APPENDECTOMY  11/1969  . CYSTOCELE REPAIR  1980  . ESOPHAGOGASTRODUODENOSCOPY  01/2001   nl  . ESOPHAGOGASTRODUODENOSCOPY  09/2006   Normal Carlean Purl)  . EXPLORATORY  LAPAROTOMY  09/1969  . HERNIA REPAIR  1980  . NASAL SEPTUM SURGERY  11/1976  . POLYPECTOMY  03/1972   Bladder  . [place]  . TONSILLECTOMY  1948   Patient Active Problem List   Diagnosis Date Noted  . Arthritis 06/01/2016  . B12 deficiency 06/01/2016  . Osteoporosis, post-menopausal 06/01/2016  . Parkinson disease ([place]) 06/01/2016  . Sjogren's syndrome ([place]) 06/01/2016  . Chronic cystitis 06/01/2016  . Dysuria 04/16/2016  . Recurrent major depressive disorder, in partial remission ([place]) 01/07/2016  . Chronic constipation 09/24/2014  . Dysphagia 09/24/2014  . Raynaud's phenomenon without gangrene 09/24/2014  . [place] 04/20/2013  . Bilateral dry eyes 03/06/2012  . Blepharitis of both eyes 03/06/2012  . MGD (meibomian gland dysfunction) 03/06/2012  . DYSPHAGIA PHARYNGEAL PHASE 07/21/2009  . ESOPHAGEAL MOTILITY DISORDER 02/03/2009  . FIBROMYALGIA 02/03/2009  . TROCHANTERIC BURSITIS, BILATERAL 06/21/2008  . HAND PAIN 06/21/2008  . GAIT DISTURBANCE 06/21/2008  . ABDOMINAL, RIGHT UPPER, PAIN 06/21/2008  . GRIEF REACTION 05/30/2008  . VAGINITIS 05/30/2008  . OSTEOPOROSIS 05/30/2008  . HEMORRHOIDS, INTERNAL, THROMBOSED 03/22/2008  . CHANGE IN BOWELS 03/22/2008  . FATIGUE 03/10/2007  . RESTLESS LEG SYNDROME 02/19/2007  . Irritable bowel syndrome 02/19/2007  . ANXIETY 02/16/2007  . DEPRESSION 02/16/2007  . ALLERGIC RHINITIS  02/16/2007  . GERD 02/16/2007  . INTERSTITIAL CYSTITIS 02/16/2007  . [place] SYNDROME 02/16/2007  . FIBROSITIS 02/16/2007      Prior to Admission medications   Medication Sig Start Date End Date Taking? Authorizing Provider  acetaminophen (TYLENOL) 500 MG tablet Take 500 mg by mouth every 4 (four) hours as needed.    [provider]  ALPRAZolam Duanne Moron) 0.5 MG tablet Take 0.5 mg by mouth 4 (four) times daily.    [provider]  alum & mag hydroxide-simeth (MAALOX/MYLANTA) 200-200-20 MG/5ML suspension Take 30 mLs by  mouth every 6 (six) hours as needed for indigestion or heartburn.    [provider]  antiseptic oral rinse (BIOTENE) LIQD 15 mLs by Mouth Rinse route as needed for dry mouth.    [provider]  bisacodyl (DULCOLAX) 10 MG suppository Place 10 mg rectally daily as needed for moderate constipation.    [provider]  Carbidopa-Levodopa ER (SINEMET CR) 25-100 MG tablet controlled release Take 1.5 tablets by mouth 3 (three) times daily.    [provider]  desonide (DESOWEN) 0.05 % cream Apply 1 application topically 2 (two) times daily as needed.     [provider]  dimenhyDRINATE (DRAMAMINE) 50 MG tablet Take 50 mg by mouth every 4 (four) hours as needed.    [provider]  estrogens, conjugated, (PREMARIN) 0.625 MG tablet Take 0.625 mg by mouth daily. Take daily for 21 days then do not take for 7 days.     [provider]  FLUoxetine (PROZAC) 10 MG tablet Take 5 mg by mouth daily.      [provider]  Glycerin (OASIS MOISTURIZING MOUTH SPRAY) 35 % LIQD Use as directed 4 sprays in the mouth or throat at bedtime.    [provider]  Glycerin, Laxative, (FLEET LIQUID GLYCERIN SUPP RE) Place 1 suppository rectally daily.    [provider]  guaifenesin (ROBAFEN) 100 MG/5ML syrup Take 200 mg by mouth 4 (four) times daily as needed for cough.    [provider]  Homeopathic Products (ARNICARE ARNICA EX) Apply 1 application topically daily as needed.    [provider]  ibuprofen (ADVIL,MOTRIN) 200 MG tablet Take 200 mg by mouth 2 (two) times daily.    [provider]  ibuprofen (ADVIL,MOTRIN) 400 MG tablet Take 400 mg by mouth 2 (two) times daily.    [provider]  Lifitegrast Shirley Friar) 5 % SOLN Apply 1 drop to eye 2 (two) times daily.    [provider]  loperamide (IMODIUM) 2 MG capsule Take 2 mg by mouth as needed for diarrhea or loose stools.    [provider]  loratadine (CLARITIN) 10 MG tablet Take 5 mg by mouth daily as needed.     [provider]  magnesium hydroxide (MILK OF MAGNESIA) 400 MG/5ML suspension Take 30 mLs by mouth at bedtime as needed for mild constipation.    [provider]  magnesium oxide (MAG-OX) 400 MG tablet Take 400 mg by mouth at bedtime as needed.    [provider]  Meth-Hyo-M Bl-Na Phos-Ph Sal (URIBEL) 118 MG CAPS Take by mouth.    [provider]  phenazopyridine (PYRIDIUM) 95 MG tablet Take 95 mg by mouth 4 (four) times daily. After each meal     [provider]  Polyethyl Glycol-Propyl Glycol (SYSTANE) 0.4-0.3 % GEL ophthalmic gel Place 1 application into both eyes 4 (four) times daily.    [provider]  shark liver oil-cocoa butter (PREPARATION H) 0.25-88.44 % suppository Place 1 suppository rectally as needed for hemorrhoids.    [provider]  Wheat Dextrin (BENEFIBER) POWD Take 10 mLs by mouth 2 (two) times daily as needed.    [provider]    Allergies Lidocaine; Other; Propoxyphene; Ascorbic acid; Azithromycin; Basil oil; Calcium; Celecoxib; Cephalexin; Dexamethasone; Doxycycline; Itraconazole; Meperidine hcl; Meperidine hcl; Mirabegron; Morphine; Morphine sulfate; [place] oil; Pantoprazole sodium; Pantoprazole sodium; Penicillins; Propoxyphene hcl; Sulfa antibiotics; Sulfamethoxazole-trimethoprim; and Thimerosal    Social History Social History  Substance Use Topics  . Smoking status: Former Research scientist (life sciences)  . Smokeless tobacco: Never Used  . Alcohol use No    Review of Systems Patient denies headaches, rhinorrhea, blurry vision, numbness, shortness of breath, chest pain, edema, cough, abdominal pain, nausea, vomiting, diarrhea, dysuria, fevers, rashes or hallucinations unless otherwise stated above in HPI. ____________________________________________   PHYSICAL EXAM:  VITAL SIGNS: Vitals:   04/21/17 2210  BP: (!)  147/69  Pulse: 70  Resp: 18  Temp: (!) 97.5 F (36.4 C)  SpO2: 100%    Constitutional: Alert and oriented. Well appearing and in no acute distress. Eyes: left eye conjunctiva injected, no dendritic or vesicular lesions on woods lamp exam.  No hyphema or hypopion Head: Atraumatic. Nose: No congestion/rhinnorhea. Mouth/Throat: Mucous membranes are moist.   Neck: No stridor. Painless ROM.  Cardiovascular: Normal rate, regular rhythm. Grossly normal heart sounds.  Good peripheral circulation. Respiratory: Normal respiratory effort.  No retractions. Lungs CTAB. Gastrointestinal: Soft and nontender. No distention. No abdominal bruits. No CVA tenderness. Genitourinary:  Musculoskeletal: No lower extremity tenderness nor edema.  No joint effusions. Neurologic:  weak and quiet speech. No gross focal neurologic deficits are appreciated. No facial droop Skin:  Skin is warm, dry and intact. No rash noted. Psychiatric: Mood and affect are normal. Speech and behavior are normal.  ____________________________________________   LABS (all labs ordered are listed, but only abnormal results are displayed)  No results found for this or any previous visit (from the past 24 hour(s)). ____________________________________________ ____________________________________________  [id number]   ____________________________________________   PROCEDURES  Procedure(s) performed:  Procedures    Critical Care performed: no ____________________________________________   INITIAL IMPRESSION / ASSESSMENT AND PLAN / ED COURSE  Pertinent labs & imaging results that were available during my care of the patient were reviewed by me and considered in my medical decision making (see chart for details).  DDX: herpes, shignles, zoster ophthalmicus  ZAMYIA GOWELL is a 76 y.o. who presents to the ED with worsening pain related to shingles outbreak. On physical exam she does not have any evidence of  ulceration or dendritic lesions to the eye. She is otherwise well and nontoxic. Will give dose of Valtrex as well as pain medication. Patient is stable for follow-up with ophthalmology tomorrow.  No other neuro deficits.  Have discussed with the patient and available family all diagnostics and treatments performed thus far and all questions were answered to the best of my ability. The patient demonstrates understanding and agreement with plan.       ____________________________________________   FINAL CLINICAL IMPRESSION(S) / ED DIAGNOSES  Final diagnoses:  Herpes zoster without complication      NEW MEDICATIONS STARTED DURING THIS VISIT:  New Prescriptions   No medications on file     Note:  This document was prepared using Dragon voice recognition software and may include unintentional dictation errors.    Merlyn Lot, MD 04/21/17 2225

## 2017-04-21 NOTE — ED Notes (Signed)
Eye drops arrived from pharmacy

## 2017-04-21 NOTE — ED Triage Notes (Signed)
Per EMS, pt from [facility] with left eye and facial pain due to being diagnosed with shingles today. Pt states she was prescribed Valtrex however has not begun trx at this time. Pt alert and oriented at this time and in NAD.

## 2017-04-21 NOTE — ED Notes (Signed)
Pharmacy called about eye drops

## 2017-04-21 NOTE — ED Notes (Signed)
EMS has been called for transport

## 2017-04-22 DIAGNOSIS — B0230 Zoster ocular disease, unspecified: Secondary | ICD-10-CM | POA: Diagnosis not present

## 2017-04-22 NOTE — ED Notes (Signed)
EMS here to take pt back to [facility] 

## 2017-04-29 DIAGNOSIS — H04123 Dry eye syndrome of bilateral lacrimal glands: Secondary | ICD-10-CM | POA: Diagnosis not present

## 2017-05-05 DIAGNOSIS — G20 Parkinson's disease: Secondary | ICD-10-CM | POA: Diagnosis not present

## 2017-05-19 DIAGNOSIS — E538 Deficiency of other specified B group vitamins: Secondary | ICD-10-CM | POA: Diagnosis not present

## 2017-06-03 DIAGNOSIS — N3010 Interstitial cystitis (chronic) without hematuria: Secondary | ICD-10-CM | POA: Diagnosis not present

## 2017-06-03 DIAGNOSIS — E785 Hyperlipidemia, unspecified: Secondary | ICD-10-CM | POA: Diagnosis not present

## 2017-06-03 DIAGNOSIS — F3341 Major depressive disorder, recurrent, in partial remission: Secondary | ICD-10-CM | POA: Diagnosis not present

## 2017-06-03 DIAGNOSIS — G20 Parkinson's disease: Secondary | ICD-10-CM | POA: Diagnosis not present

## 2017-06-03 DIAGNOSIS — E538 Deficiency of other specified B group vitamins: Secondary | ICD-10-CM | POA: Diagnosis not present

## 2017-06-16 DIAGNOSIS — E538 Deficiency of other specified B group vitamins: Secondary | ICD-10-CM | POA: Diagnosis not present

## 2017-06-16 DIAGNOSIS — Z0000 Encounter for general adult medical examination without abnormal findings: Secondary | ICD-10-CM | POA: Diagnosis not present

## 2017-06-16 DIAGNOSIS — R1310 Dysphagia, unspecified: Secondary | ICD-10-CM | POA: Diagnosis not present

## 2017-06-16 DIAGNOSIS — H02889 Meibomian gland dysfunction of unspecified eye, unspecified eyelid: Secondary | ICD-10-CM | POA: Diagnosis not present

## 2017-06-16 DIAGNOSIS — K589 Irritable bowel syndrome without diarrhea: Secondary | ICD-10-CM | POA: Diagnosis not present

## 2017-06-16 DIAGNOSIS — I7300 Raynaud's syndrome without gangrene: Secondary | ICD-10-CM | POA: Diagnosis not present

## 2017-06-16 DIAGNOSIS — Z23 Encounter for immunization: Secondary | ICD-10-CM | POA: Diagnosis not present

## 2017-06-16 DIAGNOSIS — M797 Fibromyalgia: Secondary | ICD-10-CM | POA: Diagnosis not present

## 2017-06-16 DIAGNOSIS — R300 Dysuria: Secondary | ICD-10-CM | POA: Diagnosis not present

## 2017-06-16 DIAGNOSIS — N3010 Interstitial cystitis (chronic) without hematuria: Secondary | ICD-10-CM | POA: Diagnosis not present

## 2017-06-16 DIAGNOSIS — G20 Parkinson's disease: Secondary | ICD-10-CM | POA: Diagnosis not present

## 2017-06-16 DIAGNOSIS — F3341 Major depressive disorder, recurrent, in partial remission: Secondary | ICD-10-CM | POA: Diagnosis not present

## 2017-06-16 DIAGNOSIS — M3509 Sicca syndrome with other organ involvement: Secondary | ICD-10-CM | POA: Diagnosis not present

## 2017-06-16 DIAGNOSIS — M810 Age-related osteoporosis without current pathological fracture: Secondary | ICD-10-CM | POA: Diagnosis not present

## 2017-06-20 DIAGNOSIS — E538 Deficiency of other specified B group vitamins: Secondary | ICD-10-CM | POA: Diagnosis not present

## 2017-07-07 ENCOUNTER — Encounter: Payer: Self-pay | Admitting: Emergency Medicine

## 2017-07-07 ENCOUNTER — Emergency Department: Payer: PPO

## 2017-07-07 ENCOUNTER — Emergency Department
Admission: EM | Admit: 2017-07-07 | Discharge: 2017-07-07 | Disposition: A | Discharge status: Skilled Nursing Facility | Payer: PPO | Attending: Emergency Medicine | Admitting: Emergency Medicine

## 2017-07-07 ENCOUNTER — Other Ambulatory Visit: Payer: Self-pay

## 2017-07-07 DIAGNOSIS — Z79899 Other long term (current) drug therapy: Secondary | ICD-10-CM | POA: Insufficient documentation

## 2017-07-07 DIAGNOSIS — G20 Parkinson's disease: Secondary | ICD-10-CM | POA: Insufficient documentation

## 2017-07-07 DIAGNOSIS — S8001XA Contusion of right knee, initial encounter: Secondary | ICD-10-CM | POA: Diagnosis not present

## 2017-07-07 DIAGNOSIS — W19XXXA Unspecified fall, initial encounter: Secondary | ICD-10-CM | POA: Diagnosis not present

## 2017-07-07 DIAGNOSIS — Z87891 Personal history of nicotine dependence: Secondary | ICD-10-CM | POA: Insufficient documentation

## 2017-07-07 DIAGNOSIS — Z743 Need for continuous supervision: Secondary | ICD-10-CM | POA: Diagnosis not present

## 2017-07-07 DIAGNOSIS — Y939 Activity, unspecified: Secondary | ICD-10-CM | POA: Diagnosis not present

## 2017-07-07 DIAGNOSIS — S3993XA Unspecified injury of pelvis, initial encounter: Secondary | ICD-10-CM | POA: Diagnosis not present

## 2017-07-07 DIAGNOSIS — Y999 Unspecified external cause status: Secondary | ICD-10-CM | POA: Diagnosis not present

## 2017-07-07 DIAGNOSIS — W010XXA Fall on same level from slipping, tripping and stumbling without subsequent striking against object, initial encounter: Secondary | ICD-10-CM | POA: Diagnosis not present

## 2017-07-07 DIAGNOSIS — Z9181 History of falling: Secondary | ICD-10-CM | POA: Diagnosis not present

## 2017-07-07 DIAGNOSIS — S0003XA Contusion of scalp, initial encounter: Secondary | ICD-10-CM | POA: Diagnosis not present

## 2017-07-07 DIAGNOSIS — M533 Sacrococcygeal disorders, not elsewhere classified: Secondary | ICD-10-CM | POA: Diagnosis not present

## 2017-07-07 DIAGNOSIS — S0990XA Unspecified injury of head, initial encounter: Secondary | ICD-10-CM | POA: Diagnosis not present

## 2017-07-07 DIAGNOSIS — S199XXA Unspecified injury of neck, initial encounter: Secondary | ICD-10-CM | POA: Diagnosis not present

## 2017-07-07 DIAGNOSIS — Y929 Unspecified place or not applicable: Secondary | ICD-10-CM | POA: Insufficient documentation

## 2017-07-07 DIAGNOSIS — N390 Urinary tract infection, site not specified: Secondary | ICD-10-CM

## 2017-07-07 DIAGNOSIS — F05 Delirium due to known physiological condition: Secondary | ICD-10-CM | POA: Diagnosis not present

## 2017-07-07 LAB — CBC WITH DIFFERENTIAL/PLATELET
BASOS ABS: 0 10*3/uL (ref 0–0.1)
Basophils Relative: 0 %
EOS ABS: 0 10*3/uL (ref 0–0.7)
EOS PCT: 0 %
HCT: 41.2 % (ref 35.0–47.0)
Hemoglobin: 13.5 g/dL (ref 12.0–16.0)
LYMPHS PCT: 11 %
Lymphs Abs: 0.9 10*3/uL — ABNORMAL LOW (ref 1.0–3.6)
MCH: 30.3 pg (ref 26.0–34.0)
MCHC: 32.9 g/dL (ref 32.0–36.0)
MCV: 92.1 fL (ref 80.0–100.0)
Monocytes Absolute: 0.4 10*3/uL (ref 0.2–0.9)
Monocytes Relative: 5 %
NEUTROS PCT: 84 %
Neutro Abs: 6.8 10*3/uL — ABNORMAL HIGH (ref 1.4–6.5)
PLATELETS: 205 10*3/uL (ref 150–440)
RBC: 4.47 MIL/uL (ref 3.80–5.20)
RDW: 12.9 % (ref 11.5–14.5)
WBC: 8.2 10*3/uL (ref 3.6–11.0)

## 2017-07-07 LAB — URINALYSIS, COMPLETE (UACMP) WITH MICROSCOPIC
Bilirubin Urine: NEGATIVE
Glucose, UA: NEGATIVE mg/dL
Ketones, ur: 5 mg/dL — AB
Nitrite: POSITIVE — AB
PROTEIN: NEGATIVE mg/dL
SQUAMOUS EPITHELIAL / LPF: NONE SEEN
Specific Gravity, Urine: 1.01 (ref 1.005–1.030)
pH: 5 (ref 5.0–8.0)

## 2017-07-07 LAB — BASIC METABOLIC PANEL
ANION GAP: 8 (ref 5–15)
BUN: 24 mg/dL — AB (ref 6–20)
CO2: 26 mmol/L (ref 22–32)
Calcium: 8.9 mg/dL (ref 8.9–10.3)
Chloride: 101 mmol/L (ref 101–111)
Creatinine, Ser: 0.62 mg/dL (ref 0.44–1.00)
Glucose, Bld: 84 mg/dL (ref 65–99)
POTASSIUM: 3.9 mmol/L (ref 3.5–5.1)
SODIUM: 135 mmol/L (ref 135–145)

## 2017-07-07 MED ORDER — CIPROFLOXACIN HCL 500 MG PO TABS: 500.0000 mg | ORAL_TABLET | Freq: Two times a day (BID) | ORAL | 0 refills | Status: AC

## 2017-07-07 MED ORDER — CIPROFLOXACIN HCL 500 MG PO TABS
500.0000 mg | ORAL_TABLET | Freq: Once | ORAL | Status: AC
  Administered 2017-07-07: 500 mg via ORAL
  Filled 2017-07-07: qty 1

## 2017-07-07 NOTE — ED Triage Notes (Signed)
Pt presents to ED via [facility] from [facility] c/o fall with head involvement. Pt states she did not "lose consciousness" but "fell asleep" while sitting on commode. Pt states she has not been sleeping well recently. C/o pain primarily to sacrum. Bruising noted to R knee and hematoma to top of head. This is pt's second fall today. States earlier she fell onto her bottom but was able to pick herself up off the floor. Pt is alert and oriented but does not speak above a whisper.

## 2017-07-07 NOTE — ED Notes (Signed)

## 2017-07-07 NOTE — Discharge Instructions (Addendum)
You are evaluated after a fall, and have bruising to the head as well as the right knee and the buttock area, but no serious injury is suspected.  You are being treated for urinary tract infection with anabolic Cipro.  Return to the emergency department immediately for any new or worsening condition including uncontrolled pain, confusion altered mental status, fever, inability to urinate, abdominal pain, or any other symptoms concerning to you.

## 2017-07-07 NOTE — ED Notes (Signed)
The patient was helped to the restroom. Able to walk with help but slightly unsteady. Right leg seems to be hard for her to move well.

## 2017-07-07 NOTE — ED Provider Notes (Signed)
[facility] Emergency Department Provider Note ____________________________________________   I have reviewed the triage vital signs and the triage nursing note.  HISTORY  Chief Complaint Fall   Historian Patient  HPI Kaitlyn Church is a 76 y.o. female with a history of Parkinson's, states that she has frequent falls, presents today after a fall.  She struck her head.  She has a mild headache and mild neck discomfort.  She also has sacral pain.  She also has right knee pain with there is a bruise.  Pain is moderate.  Nothing makes it worse or better.  She does not report recent illnesses.   Past Medical History:  Diagnosis Date  . Abdominal pain, right upper quadrant   . Allergic rhinitis   . Anxiety   . Depression   . Esophageal motility disorder   . Fibromyalgia   . Gait disturbance   . GERD (gastroesophageal reflux disease)   . Grief reaction   . Hemorrhoids, internal, thrombosed   . Interstitial cystitis   . Irritable bowel syndrome   . Osteoporosis   . Parkinson disease ([place])   . Restless leg syndrome   . Shingles   . Sjogren's syndrome ([place])    ?  Marland Kitchen Trochanteric bursitis    bilateral  . Vaginitis     Patient Active Problem List   Diagnosis Date Noted  . Arthritis 06/01/2016  . B12 deficiency 06/01/2016  . Osteoporosis, post-menopausal 06/01/2016  . Parkinson disease ([place]) 06/01/2016  . Sjogren's syndrome ([place]) 06/01/2016  . Chronic cystitis 06/01/2016  . Dysuria 04/16/2016  . Recurrent major depressive disorder, in partial remission ([place]) 01/07/2016  . Chronic constipation 09/24/2014  . Dysphagia 09/24/2014  . Raynaud's phenomenon without gangrene 09/24/2014  . [place] 04/20/2013  . Bilateral dry eyes 03/06/2012  . Blepharitis of both eyes 03/06/2012  . MGD (meibomian gland dysfunction) 03/06/2012  . DYSPHAGIA PHARYNGEAL PHASE 07/21/2009  . ESOPHAGEAL MOTILITY DISORDER 02/03/2009  . FIBROMYALGIA 02/03/2009  .  TROCHANTERIC BURSITIS, BILATERAL 06/21/2008  . HAND PAIN 06/21/2008  . GAIT DISTURBANCE 06/21/2008  . ABDOMINAL, RIGHT UPPER, PAIN 06/21/2008  . GRIEF REACTION 05/30/2008  . VAGINITIS 05/30/2008  . OSTEOPOROSIS 05/30/2008  . HEMORRHOIDS, INTERNAL, THROMBOSED 03/22/2008  . CHANGE IN BOWELS 03/22/2008  . FATIGUE 03/10/2007  . RESTLESS LEG SYNDROME 02/19/2007  . Irritable bowel syndrome 02/19/2007  . ANXIETY 02/16/2007  . DEPRESSION 02/16/2007  . ALLERGIC RHINITIS 02/16/2007  . GERD 02/16/2007  . INTERSTITIAL CYSTITIS 02/16/2007  . [place] SYNDROME 02/16/2007  . FIBROSITIS 02/16/2007    Past Surgical History:  Procedure Laterality Date  . ABDOMINAL HYSTERECTOMY  10/2002  . APPENDECTOMY  11/1969  . CYSTOCELE REPAIR  1980  . ESOPHAGOGASTRODUODENOSCOPY  01/2001   nl  . ESOPHAGOGASTRODUODENOSCOPY  09/2006   Normal Carlean Purl)  . EXPLORATORY LAPAROTOMY  09/1969  . HERNIA REPAIR  1980  . NASAL SEPTUM SURGERY  11/1976  . POLYPECTOMY  03/1972   Bladder  . [place]  . TONSILLECTOMY  1948    Prior to Admission medications   Medication Sig Start Date End Date Taking? Authorizing Provider  acetaminophen (TYLENOL) 500 MG tablet Take 500 mg by mouth every 4 (four) hours as needed.    [provider]  ALPRAZolam Duanne Moron) 0.5 MG tablet Take 0.5 mg by mouth 4 (four) times daily.    [provider]  alum & mag hydroxide-simeth (MAALOX/MYLANTA) 200-200-20 MG/5ML suspension Take 30 mLs by mouth every 6 (six) hours as needed for indigestion  or heartburn.    [provider]  antiseptic oral rinse (BIOTENE) LIQD 15 mLs by Mouth Rinse route as needed for dry mouth.    [provider]  bisacodyl (DULCOLAX) 10 MG suppository Place 10 mg rectally daily as needed for moderate constipation.    [provider]  Carbidopa-Levodopa ER (SINEMET CR) 25-100 MG tablet controlled release Take 1.5 tablets by mouth 3 (three) times daily.    [provider]  ciprofloxacin (CIPRO) 500 MG tablet Take 1 tablet (500 mg total) 2 (two) times daily for 7 days by mouth. 07/07/17 07/14/17  Lisa Roca, MD  desonide (DESOWEN) 0.05 % cream Apply 1 application topically 2 (two) times daily as needed.     [provider]  dimenhyDRINATE (DRAMAMINE) 50 MG tablet Take 50 mg by mouth every 4 (four) hours as needed.    [provider]  estrogens, conjugated, (PREMARIN) 0.625 MG tablet Take 0.625 mg by mouth daily. Take daily for 21 days then do not take for 7 days.     [provider]  FLUoxetine (PROZAC) 10 MG tablet Take 5 mg by mouth daily.      [provider]  Glycerin (OASIS MOISTURIZING MOUTH SPRAY) 35 % LIQD Use as directed 4 sprays in the mouth or throat at bedtime.    [provider]  Glycerin, Laxative, (FLEET LIQUID GLYCERIN SUPP RE) Place 1 suppository rectally daily.    [provider]  guaifenesin (ROBAFEN) 100 MG/5ML syrup Take 200 mg by mouth 4 (four) times daily as needed for cough.    [provider]  Homeopathic Products (ARNICARE ARNICA EX) Apply 1 application topically daily as needed.    [provider]  ibuprofen (ADVIL,MOTRIN) 200 MG tablet Take 200 mg by mouth 2 (two) times daily.    [provider]  ibuprofen (ADVIL,MOTRIN) 400 MG tablet Take 400 mg by mouth 2 (two) times daily.    [provider]  Lifitegrast Shirley Friar) 5 % SOLN Apply 1 drop to eye 2 (two) times daily.    [provider]  loperamide (IMODIUM) 2 MG capsule Take 2 mg by mouth as needed for diarrhea or loose stools.    [provider]  loratadine (CLARITIN) 10 MG tablet Take 5 mg by mouth daily as needed.     [provider]  magnesium hydroxide (MILK OF MAGNESIA) 400 MG/5ML suspension Take 30 mLs by mouth at bedtime as needed for mild constipation.    [provider]  magnesium oxide (MAG-OX) 400 MG tablet Take 400 mg by mouth at bedtime as  needed.    [provider]  Meth-Hyo-M Bl-Na Phos-Ph Sal (URIBEL) 118 MG CAPS Take by mouth.    [provider]  phenazopyridine (PYRIDIUM) 95 MG tablet Take 95 mg by mouth 4 (four) times daily. After each meal     [provider]  Polyethyl Glycol-Propyl Glycol (SYSTANE) 0.4-0.3 % GEL ophthalmic gel Place 1 application into both eyes 4 (four) times daily.    [provider]  shark liver oil-cocoa butter (PREPARATION H) 0.25-88.44 % suppository Place 1 suppository rectally as needed for hemorrhoids.    [provider]  Wheat Dextrin (BENEFIBER) POWD Take 10 mLs by mouth 2 (two) times daily as needed.    [provider]    Allergies  Allergen Reactions  . Lidocaine Other (See Comments)  . Other Other (See Comments)    darvocet. darvocet - HALLUCINATIONS  . Propoxyphene Other (See Comments)  Other Reaction: hallucinations, weird dreams  . Ascorbic Acid Other (See Comments)  . Azithromycin Other (See Comments)    Other Reaction: sore, swollen tongue  . Basil Oil Other (See Comments)  . Calcium Other (See Comments)  . Celecoxib Other (See Comments)  . Cephalexin     REACTION: felt tired but tolerated  . Dexamethasone Other (See Comments)    Other Reaction: insomnia, chest spasms  . Doxycycline Hives  . Itraconazole Other (See Comments)  . Meperidine Hcl   . Meperidine Hcl Other (See Comments)  . Mirabegron Other (See Comments)    intolerant  . Morphine Hives  . Morphine Sulfate   . [place] Oil Other (See Comments)  . Pantoprazole Sodium     REACTION: diarrhea  . Pantoprazole Sodium Other (See Comments)    REACTION: diarrhea  . Penicillins Other (See Comments)  . Propoxyphene Hcl   . Sulfa Antibiotics Other (See Comments)    Swollen lip and tongue Blisters on tongue  . Sulfamethoxazole-Trimethoprim Other (See Comments)  . Thimerosal Other (See Comments)    Eye burning    Family History  Problem Relation Age of Onset   . Breast cancer Mother   . Alcohol abuse Father   . Pneumonia Father   . Bipolar disorder Son   . Coronary artery disease Neg Hx   . Diabetes Neg Hx   . Hypertension Neg Hx   . Colon cancer Neg Hx   . Prostate cancer Neg Hx   . Kidney cancer Neg Hx     Social History Social History   Tobacco Use  . Smoking status: Former Research scientist (life sciences)  . Smokeless tobacco: Never Used  Substance Use Topics  . Alcohol use: No  . Drug use: No    Review of Systems  Constitutional: Negative for fever. Eyes: Negative for visual changes. ENT: Negative for sore throat. Cardiovascular: Negative for chest pain. Respiratory: Negative for shortness of breath. Gastrointestinal: Negative for abdominal pain, vomiting and diarrhea. Genitourinary: Negative for dysuria. Musculoskeletal: Negative for back pain.  Positive for sacral pain.  Positive for right knee pain Skin: Negative for rash. Neurological: Positive for headache.  ____________________________________________   PHYSICAL EXAM:  VITAL SIGNS: ED Triage Vitals [07/07/17 1551]  Enc Vitals Group     BP (!) 143/71     Pulse Rate 69     Resp 18     Temp      Temp src      SpO2 99 %     Weight 106 lb (48.1 kg)     Height 5\' 5"  (1.651 m)     Head Circumference      Peak Flow      Pain Score      Pain Loc      Pain Edu?      Excl. in [place]?      Constitutional: Alert and oriented. Well appearing and in no distress. HEENT   Head: Normocephalic and atraumatic.      Eyes: Conjunctivae are normal. Pupils equal and round.       Ears:         Nose: No congestion/rhinnorhea.   Mouth/Throat: Mucous membranes are moist.   Neck: No stridor.  Mild posterior neck tenderness laterally into the spine without point tenderness on the spine, without step-off. Cardiovascular/Chest: Normal rate, regular rhythm.  No murmurs, rubs, or gallops. Respiratory: Normal respiratory effort without tachypnea nor retractions. Breath sounds are clear and equal  bilaterally. No wheezes/rales/rhonchi. Gastrointestinal: Soft. No distention,  no guarding, no rebound. Nontender.    Genitourinary/rectal:Deferred Musculoskeletal: Pelvis stable and hips nontender.  Patient has bruising to the knee With some tenderness around the knee without obvious swelling. Neurologic: Very soft voice.  Normal speech and language. No gross or focal neurologic deficits are appreciated. Skin:  Skin is warm, dry and intact. No rash noted. Psychiatric: Mood and affect are normal. Speech and behavior are normal. Patient exhibits appropriate insight and judgment.   ____________________________________________  LABS (pertinent positives/negatives) I, Lisa Roca, MD the attending physician have reviewed the labs noted below.  Labs Reviewed  URINALYSIS, COMPLETE (UACMP) WITH MICROSCOPIC - Abnormal; Notable for the following components:      Result Value   Color, Urine BLUE (*)    APPearance CLEAR (*)    Hgb urine dipstick SMALL (*)    Ketones, ur 5 (*)    Nitrite POSITIVE (*)    Leukocytes, UA TRACE (*)    Bacteria, UA MANY (*)    All other components within normal limits  BASIC METABOLIC PANEL - Abnormal; Notable for the following components:   BUN 24 (*)    All other components within normal limits  CBC WITH DIFFERENTIAL/PLATELET - Abnormal; Notable for the following components:   Neutro Abs 6.8 (*)    Lymphs Abs 0.9 (*)    All other components within normal limits  URINE CULTURE    ____________________________________________    EKG I, Lisa Roca, MD, the attending physician have personally viewed and interpreted all ECGs.  63 bpm normal sinus rhythm.  Narrow crisp renal axis.  Nonspecific ST and T wave ____________________________________________  RADIOLOGY All Xrays were viewed by me.  Imaging interpreted by Radiologist, and I, Lisa Roca, MD the attending physician have reviewed the radiologist interpretation noted below.  CT head:   IMPRESSION: No acute intracranial abnormality  Negative for cervical spine fracture.  Pelvis x-ray:  IMPRESSION: Negative.  Right knee x-ray with sunrise:  IMPRESSION: Negative.  __________________________________________  PROCEDURES  Procedure(s) performed: None  Critical Care performed: None   ____________________________________________  ED COURSE / ASSESSMENT AND PLAN  Pertinent labs & imaging results that were available during my care of the patient were reviewed by me and considered in my medical decision making (see chart for details).    We discussed images to evaluate for traumatic injury.  And these images are negative for head injury, C-spine injury, pelvis and right knee significant traumatic injury.  I discussed with her obtaining screening laboratory evaluation urinalysis to make sure there were no contribute factors to being more weak than typical.  She is not having focal weakness in the not suspicious of a stroke.  Laboratory studies are reassuring.  Urinalysis shows nitrite positive UTI.  Patient does not appear to be septic.  I think she is okay for outpatient management.  She was given her first dose of Cipro here.  Reports cephalosporin allergy.  CONSULTATIONS: None   Patient / Family / Caregiver informed of clinical course, medical decision-making process, and agree with plan.   I discussed return precautions, follow-up instructions, and discharge instructions with patient and/or family.  Discharge Instructions : You are evaluated after a fall, and have bruising to the head as well as the right knee and the buttock area, but no serious injury is suspected.  You are being treated for urinary tract infection with anabolic Cipro.  Return to the emergency department immediately for any new or worsening condition including uncontrolled pain, confusion altered mental status, fever, inability to  urinate, abdominal pain, or any other symptoms concerning to  you.   ___________________________________________  ED Discharge Orders        Ordered    ciprofloxacin (CIPRO) 500 MG tablet  2 times daily     07/07/17 1909      ___________________________________________   FINAL CLINICAL IMPRESSION(S) / ED DIAGNOSES   Final diagnoses:  Fall, initial encounter  Contusion of scalp, initial encounter  Contusion of right knee, initial encounter  Urinary tract infection without hematuria, site unspecified              Note: This dictation was prepared with Dragon dictation. Any transcriptional errors that result from this process are unintentional    Lisa Roca, MD 07/07/17 1910

## 2017-07-07 NOTE — ED Notes (Signed)
Pt states she does not have anyone who can take her back to [facility], Secretary notified pt will need to return home via EMS. Pt verbalizes understanding of this.

## 2017-07-10 LAB — URINE CULTURE: Culture: 50000 — AB

## 2017-07-25 DIAGNOSIS — E538 Deficiency of other specified B group vitamins: Secondary | ICD-10-CM | POA: Diagnosis not present

## 2017-07-27 DIAGNOSIS — R300 Dysuria: Secondary | ICD-10-CM | POA: Diagnosis not present

## 2017-08-08 ENCOUNTER — Ambulatory Visit: Payer: PPO | Admitting: Podiatry

## 2017-08-11 ENCOUNTER — Ambulatory Visit: Payer: PPO | Admitting: Podiatry

## 2017-08-11 ENCOUNTER — Other Ambulatory Visit: Payer: Self-pay

## 2017-08-11 ENCOUNTER — Emergency Department
Admission: EM | Admit: 2017-08-11 | Discharge: 2017-08-11 | Disposition: A | Discharge status: Home / Self Care | Payer: PPO | Attending: Emergency Medicine | Admitting: Emergency Medicine

## 2017-08-11 ENCOUNTER — Emergency Department: Payer: PPO

## 2017-08-11 DIAGNOSIS — Y999 Unspecified external cause status: Secondary | ICD-10-CM | POA: Insufficient documentation

## 2017-08-11 DIAGNOSIS — G20 Parkinson's disease: Secondary | ICD-10-CM | POA: Diagnosis not present

## 2017-08-11 DIAGNOSIS — S0003XA Contusion of scalp, initial encounter: Secondary | ICD-10-CM

## 2017-08-11 DIAGNOSIS — Y939 Activity, unspecified: Secondary | ICD-10-CM | POA: Insufficient documentation

## 2017-08-11 DIAGNOSIS — S8992XA Unspecified injury of left lower leg, initial encounter: Secondary | ICD-10-CM | POA: Diagnosis present

## 2017-08-11 DIAGNOSIS — W050XXA Fall from non-moving wheelchair, initial encounter: Secondary | ICD-10-CM | POA: Diagnosis not present

## 2017-08-11 DIAGNOSIS — Z87891 Personal history of nicotine dependence: Secondary | ICD-10-CM | POA: Insufficient documentation

## 2017-08-11 DIAGNOSIS — W19XXXA Unspecified fall, initial encounter: Secondary | ICD-10-CM

## 2017-08-11 DIAGNOSIS — S8002XA Contusion of left knee, initial encounter: Secondary | ICD-10-CM | POA: Diagnosis not present

## 2017-08-11 DIAGNOSIS — Y92129 Unspecified place in nursing home as the place of occurrence of the external cause: Secondary | ICD-10-CM | POA: Insufficient documentation

## 2017-08-11 DIAGNOSIS — N3000 Acute cystitis without hematuria: Secondary | ICD-10-CM | POA: Diagnosis not present

## 2017-08-11 DIAGNOSIS — R51 Headache: Secondary | ICD-10-CM | POA: Diagnosis not present

## 2017-08-11 LAB — URINALYSIS, COMPLETE (UACMP) WITH MICROSCOPIC
Bacteria, UA: NONE SEEN
Specific Gravity, Urine: 1.024 (ref 1.005–1.030)

## 2017-08-11 LAB — COMPREHENSIVE METABOLIC PANEL
ALT: <5 U/L — ABNORMAL LOW (ref 14–54)
AST: 8 U/L — ABNORMAL LOW (ref 15–41)
Albumin: 3.6 g/dL (ref 3.5–5.0)
Alkaline Phosphatase: 106 U/L (ref 38–126)
Anion gap: 5 (ref 5–15)
BILIRUBIN TOTAL: 0.4 mg/dL (ref 0.3–1.2)
BUN: 25 mg/dL — ABNORMAL HIGH (ref 6–20)
CHLORIDE: 105 mmol/L (ref 101–111)
CO2: 27 mmol/L (ref 22–32)
Calcium: 8.5 mg/dL — ABNORMAL LOW (ref 8.9–10.3)
Creatinine, Ser: 0.54 mg/dL (ref 0.44–1.00)
Glucose, Bld: 85 mg/dL (ref 65–99)
POTASSIUM: 4.3 mmol/L (ref 3.5–5.1)
Sodium: 137 mmol/L (ref 135–145)
TOTAL PROTEIN: 5.9 g/dL — AB (ref 6.5–8.1)

## 2017-08-11 LAB — CBC
HEMATOCRIT: 36.7 % (ref 35.0–47.0)
HEMOGLOBIN: 12.4 g/dL (ref 12.0–16.0)
MCH: 30.9 pg (ref 26.0–34.0)
MCHC: 33.7 g/dL (ref 32.0–36.0)
MCV: 91.9 fL (ref 80.0–100.0)
Platelets: 211 10*3/uL (ref 150–440)
RBC: 3.99 MIL/uL (ref 3.80–5.20)
RDW: 12.9 % (ref 11.5–14.5)
WBC: 4.1 10*3/uL (ref 3.6–11.0)

## 2017-08-11 LAB — TROPONIN I: Troponin I: <0.03 ng/mL (ref <0.03)

## 2017-08-11 MED ORDER — CIPROFLOXACIN HCL 500 MG PO TABS
500.0000 mg | ORAL_TABLET | Freq: Once | ORAL | Status: AC
  Administered 2017-08-11: 500 mg via ORAL
  Filled 2017-08-11: qty 1

## 2017-08-11 MED ORDER — CIPROFLOXACIN HCL 500 MG PO TABS: 500.0000 mg | ORAL_TABLET | Freq: Two times a day (BID) | ORAL | 0 refills | Status: AC
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
## 2017-08-11 NOTE — ED Notes (Signed)
Per [facility], they are unable to provide transportation back to facility.  Will have Network engineer arrange for EMS transport.

## 2017-08-11 NOTE — Discharge Instructions (Signed)
Please make a follow-up appointment with your primary care physician.  Dr. Caryl Comes can look up the results of your urine culture.  Please take the entire course of antibiotics for your UTI, even if you are feeling better.    Return to the emergency department if you develop severe pain, fever, vomiting, or any other symptoms concerning to you.

## 2017-08-11 NOTE — ED Triage Notes (Signed)
Pt presents to ED via [facility] with c/o fall from [facility]. Per EMS pt fell at approx 0500-0530, pt states she fell asleep in her chair and slipped out hitting the back of her head. Per EMS no LOC but pt c/o pain to the back of her head. EMS reports multiple falls over the last few weeks.

## 2017-08-12 LAB — URINE CULTURE

## 2017-08-18 ENCOUNTER — Ambulatory Visit: Payer: PPO | Admitting: Podiatry

## 2017-08-26 DIAGNOSIS — E538 Deficiency of other specified B group vitamins: Secondary | ICD-10-CM | POA: Diagnosis not present

## 2017-08-29 ENCOUNTER — Encounter: Payer: Self-pay | Admitting: Podiatry

## 2017-08-29 ENCOUNTER — Ambulatory Visit: Payer: PPO | Admitting: Podiatry

## 2017-08-29 DIAGNOSIS — M79675 Pain in left toe(s): Secondary | ICD-10-CM

## 2017-08-29 DIAGNOSIS — M79674 Pain in right toe(s): Secondary | ICD-10-CM

## 2017-08-29 DIAGNOSIS — B351 Tinea unguium: Secondary | ICD-10-CM | POA: Diagnosis not present

## 2017-08-29 NOTE — Progress Notes (Signed)
Patient presents to the office for preventative foot care services.  This patient has not been seen in over 9 months. She states wearing her shoes are very uncomfortable.  Patient does have contracture noted of the hallux toes bilateral with the left greater than the right.  She says she is also interested in acquiring a new pair of orthotics.  She presents the office today for preventative foot care services and to discuss acquiring her orthotics.  She is accompanied by her daughter. During this visit  GENERAL APPEARANCE: Alert, conversant. Appropriately groomed. No acute distress.  VASCULAR: Pedal pulses are  palpable at  [facility] and PT bilateral.  Capillary refill time is immediate to all digits,  Normal temperature gradient.   NEUROLOGIC: sensation is normal to 5.07 monofilament at 5/5 sites bilateral.  Light touch is intact bilateral, Muscle strength normal.  MUSCULOSKELETAL: acceptable muscle strength, tone and stability bilateral.  Intrinsic muscluature intact bilateral.  Contracted hallux  toenails bilaterally.   DERMATOLOGIC: skin color, texture, and turgor are within normal limits.  No preulcerative lesions or ulcers  are seen, no interdigital maceration noted.  No open lesions present.   No drainage noted.  NAILS  Thick disfigured discolored nails both feet.  Onychomycosis   Debridement of nails  B/l .  The toe was dispensed for her to wear on her hallux left foot.  Checked orthotics on this patient and there is no record of her obtaining orthotics in the chart review, through 2013.  Therefore, I told this patient. She needs to make an appointment with Liliane Channel  so he could measure her for her second pair of orthoses.  RTC 3 months for preventative foot care services.   Gardiner Barefoot DPM

## 2017-09-26 DIAGNOSIS — E538 Deficiency of other specified B group vitamins: Secondary | ICD-10-CM | POA: Diagnosis not present

## 2017-09-28 ENCOUNTER — Other Ambulatory Visit: Payer: PPO | Admitting: Orthotics

## 2017-09-28 DIAGNOSIS — M722 Plantar fascial fibromatosis: Secondary | ICD-10-CM

## 2017-10-27 DIAGNOSIS — E538 Deficiency of other specified B group vitamins: Secondary | ICD-10-CM | POA: Diagnosis not present

## 2017-11-14 DIAGNOSIS — G20 Parkinson's disease: Secondary | ICD-10-CM | POA: Diagnosis not present

## 2017-11-24 ENCOUNTER — Ambulatory Visit: Payer: PPO | Admitting: Podiatry

## 2017-12-08 DIAGNOSIS — G20 Parkinson's disease: Secondary | ICD-10-CM | POA: Diagnosis not present

## 2017-12-08 DIAGNOSIS — M810 Age-related osteoporosis without current pathological fracture: Secondary | ICD-10-CM | POA: Diagnosis not present

## 2017-12-08 DIAGNOSIS — M3509 Sicca syndrome with other organ involvement: Secondary | ICD-10-CM | POA: Diagnosis not present

## 2017-12-08 DIAGNOSIS — E538 Deficiency of other specified B group vitamins: Secondary | ICD-10-CM | POA: Diagnosis not present

## 2017-12-08 DIAGNOSIS — N3010 Interstitial cystitis (chronic) without hematuria: Secondary | ICD-10-CM | POA: Diagnosis not present

## 2017-12-15 ENCOUNTER — Other Ambulatory Visit: Payer: Self-pay | Admitting: Internal Medicine

## 2017-12-15 DIAGNOSIS — G20 Parkinson's disease: Secondary | ICD-10-CM | POA: Diagnosis not present

## 2017-12-15 DIAGNOSIS — H02889 Meibomian gland dysfunction of unspecified eye, unspecified eyelid: Secondary | ICD-10-CM | POA: Diagnosis not present

## 2017-12-15 DIAGNOSIS — N3010 Interstitial cystitis (chronic) without hematuria: Secondary | ICD-10-CM | POA: Diagnosis not present

## 2017-12-15 DIAGNOSIS — E538 Deficiency of other specified B group vitamins: Secondary | ICD-10-CM | POA: Diagnosis not present

## 2017-12-15 DIAGNOSIS — M810 Age-related osteoporosis without current pathological fracture: Secondary | ICD-10-CM | POA: Diagnosis not present

## 2017-12-15 DIAGNOSIS — N6310 Unspecified lump in the right breast, unspecified quadrant: Secondary | ICD-10-CM

## 2017-12-15 DIAGNOSIS — M3509 Sicca syndrome with other organ involvement: Secondary | ICD-10-CM | POA: Diagnosis not present

## 2017-12-15 DIAGNOSIS — R1310 Dysphagia, unspecified: Secondary | ICD-10-CM | POA: Diagnosis not present

## 2017-12-15 DIAGNOSIS — F3341 Major depressive disorder, recurrent, in partial remission: Secondary | ICD-10-CM | POA: Diagnosis not present

## 2017-12-15 DIAGNOSIS — R300 Dysuria: Secondary | ICD-10-CM | POA: Diagnosis not present

## 2018-01-12 DIAGNOSIS — E538 Deficiency of other specified B group vitamins: Secondary | ICD-10-CM | POA: Diagnosis not present

## 2018-01-25 ENCOUNTER — Ambulatory Visit
Admission: RE | Admit: 2018-01-25 | Discharge: 2018-01-25 | Disposition: A | Discharge status: Home / Self Care | Payer: PPO | Source: Ambulatory Visit | Attending: Internal Medicine | Admitting: Internal Medicine

## 2018-01-25 DIAGNOSIS — N6310 Unspecified lump in the right breast, unspecified quadrant: Secondary | ICD-10-CM

## 2018-01-25 DIAGNOSIS — N6002 Solitary cyst of left breast: Secondary | ICD-10-CM | POA: Diagnosis not present

## 2018-01-25 DIAGNOSIS — N6001 Solitary cyst of right breast: Secondary | ICD-10-CM | POA: Diagnosis not present

## 2018-01-25 DIAGNOSIS — R921 Mammographic calcification found on diagnostic imaging of breast: Secondary | ICD-10-CM | POA: Diagnosis not present

## 2018-02-13 DIAGNOSIS — E538 Deficiency of other specified B group vitamins: Secondary | ICD-10-CM | POA: Diagnosis not present

## 2018-02-13 DIAGNOSIS — R300 Dysuria: Secondary | ICD-10-CM | POA: Diagnosis not present

## 2018-02-20 ENCOUNTER — Ambulatory Visit: Payer: PPO | Admitting: Podiatry

## 2018-02-20 ENCOUNTER — Encounter: Payer: Self-pay | Admitting: Podiatry

## 2018-02-20 DIAGNOSIS — B351 Tinea unguium: Secondary | ICD-10-CM | POA: Diagnosis not present

## 2018-02-20 DIAGNOSIS — M79674 Pain in right toe(s): Secondary | ICD-10-CM | POA: Diagnosis not present

## 2018-02-20 DIAGNOSIS — M79675 Pain in left toe(s): Secondary | ICD-10-CM | POA: Diagnosis not present

## 2018-02-20 NOTE — Progress Notes (Signed)
Complaint:  Visit Type: Patient returns to my office for continued preventative foot care services. She presents to the office with her daughter.   Complaint: Patient states" my nails have grown long and thick and become painful to walk and wear shoes" Patient has been diagnosed with Parkinsons.  . The patient presents for preventative foot care services. No changes to ROS  Podiatric Exam: Vascular: dorsalis pedis and posterior tibial pulses are palpable bilateral. Capillary return is immediate. Temperature gradient is WNL. Skin turgor WNL  Sensorium: Normal Semmes Weinstein monofilament test. Normal tactile sensation bilaterally. Nail Exam: Pt has thick disfigured discolored nails with subungual debris noted bilateral entire nail hallux through fifth toenails Ulcer Exam: There is no evidence of ulcer or pre-ulcerative changes or infection. Orthopedic Exam: Muscle tone and strength are WNL. No limitations in general ROM. No crepitus or effusions noted.Fixed dorsiflexed position left hallux. Skin: No Porokeratosis. No infection or ulcers  Diagnosis:  Onychomycosis, , Pain in right toe, pain in left toes  Treatment & Plan Procedures and Treatment: Consent by patient was obtained for treatment procedures.   Debridement of mycotic and hypertrophic toenails, 1 through 5 bilateral and clearing of subungual debris. No ulceration, no infection noted.  Return Visit-Office Procedure: Patient instructed to return to the office for a follow up visit 3 months for continued evaluation and treatment.    Gardiner Barefoot DPM

## 2018-03-09 ENCOUNTER — Ambulatory Visit: Payer: PPO

## 2018-03-16 DIAGNOSIS — E538 Deficiency of other specified B group vitamins: Secondary | ICD-10-CM | POA: Diagnosis not present

## 2018-03-26 IMAGING — RF DG SWALLOWING FUNCTION
11 series · 13 of 24 positions shown · non-contrast
Comparison: None in PACs

CLINICAL DATA: Dysphagia

EXAM:
MODIFIED BARIUM SWALLOW
TECHNIQUE: Different consistencies of barium were administered orally to the
patient by the Speech Pathologist. Imaging of the pharynx was
performed in the lateral projection.
FLUOROSCOPY TIME:  Fluoroscopy Time:  1 minutes, 36 seconds
Number of Acquired Images:  12 video sequences

[Series 1: run · 1 of 69 frames shown (1 of 11)]
[frame 11/69]
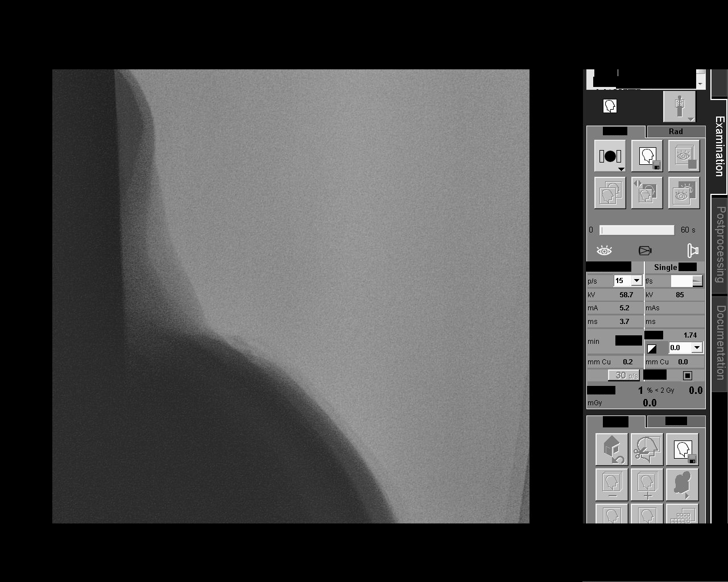

[Series 2: run · 2 of 56 frames shown (2 of 11)]
[frame 9/56]
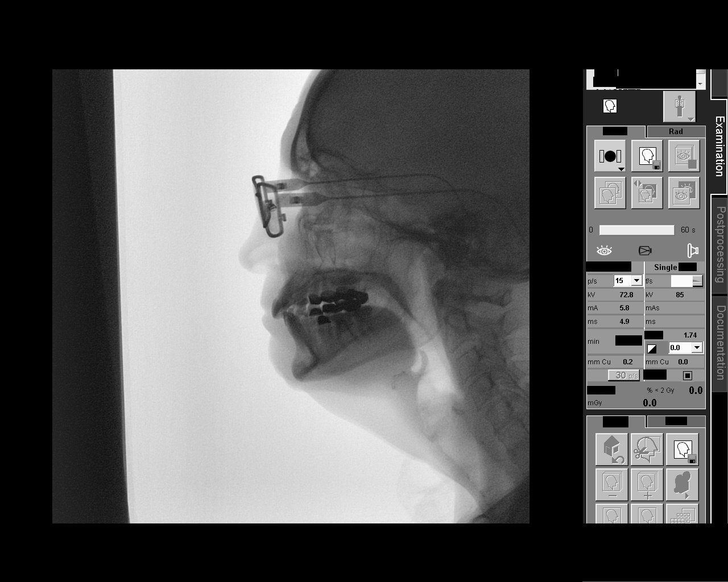
[frame 48/56]
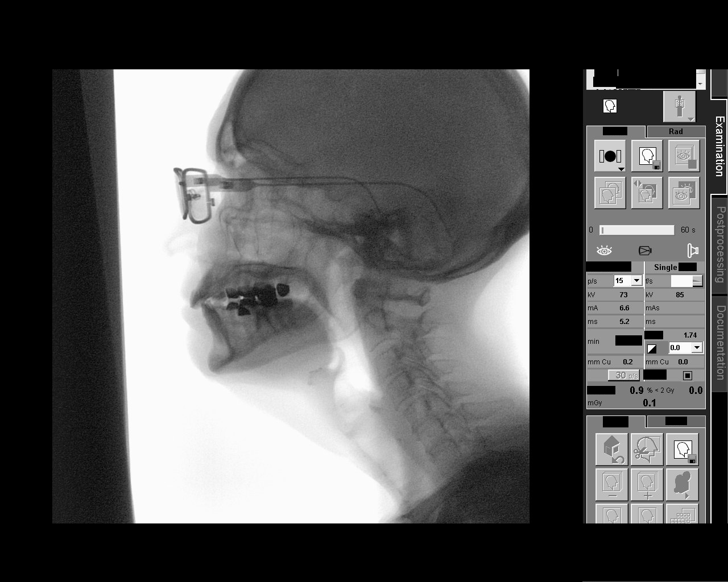

[Series 3: run · 1 of 138 frames shown (3 of 11)]
[frame 136/138]
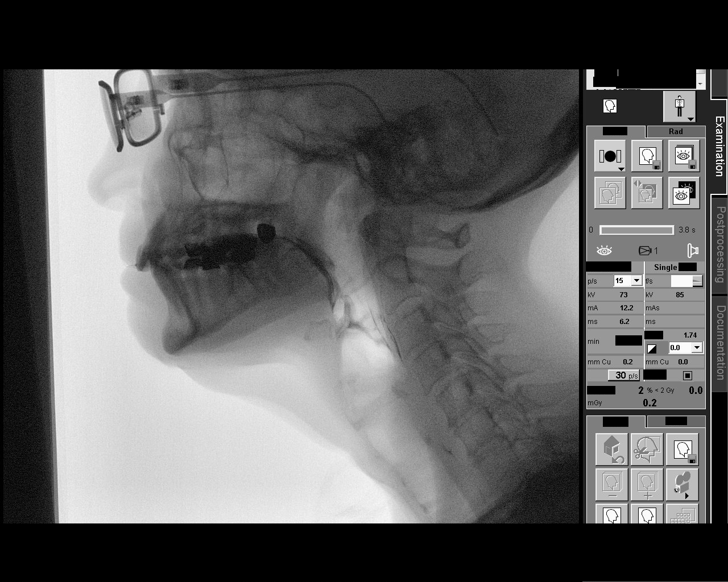

[Series 4: run · 1 of 181 frames shown (4 of 11)]
[frame 154/181]
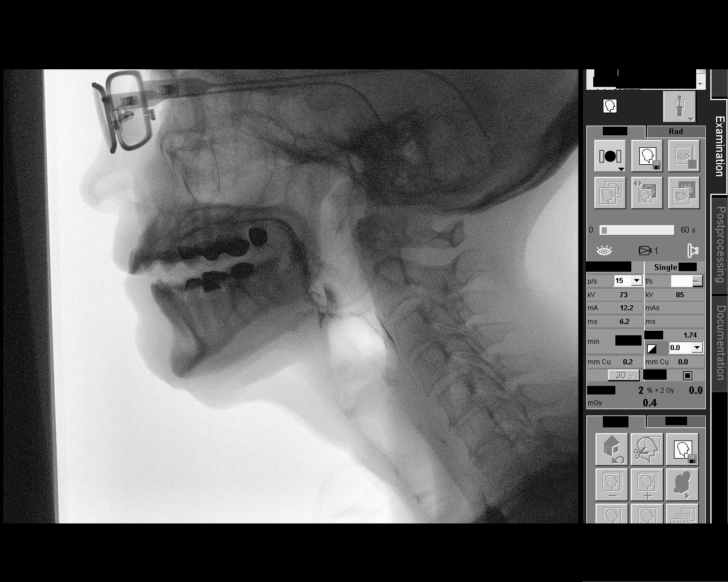

[Series 5: run · 1 of 443 frames shown (5 of 11)]
[frame 377/443]
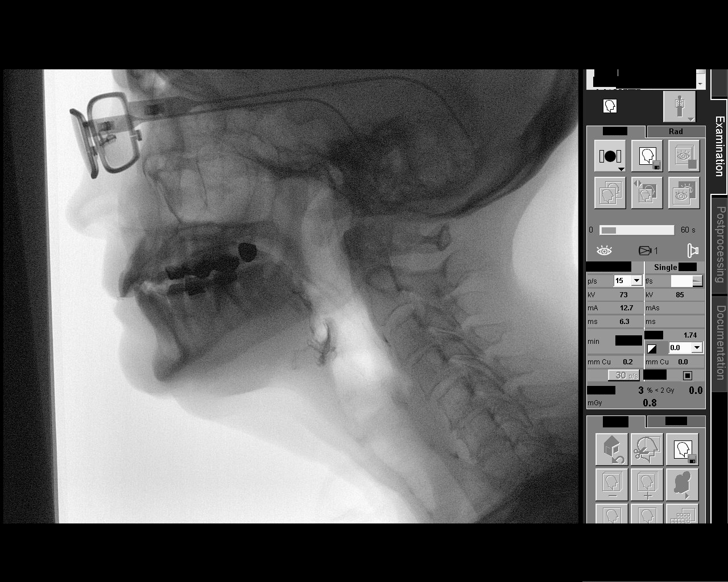

[Series 6: run · 1 of 562 frames shown (6 of 11)]
[frame 282/562]
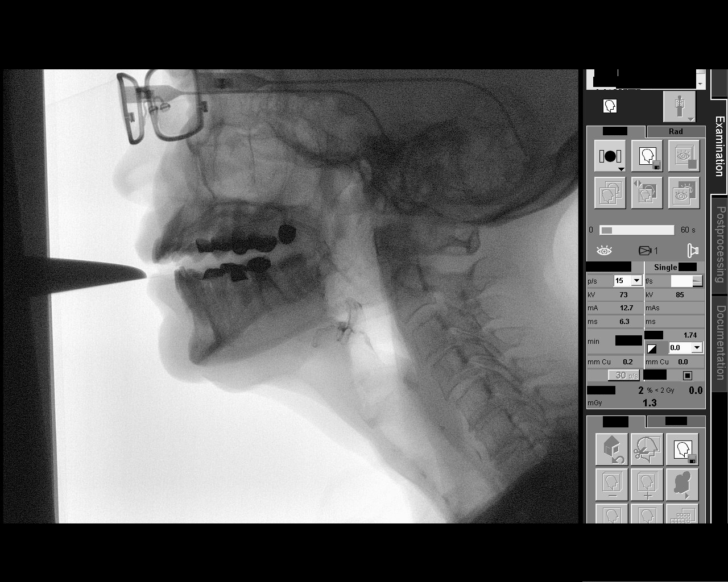

[Series 7: run · 1 of 490 frames shown (7 of 11)]
[frame 74/490]
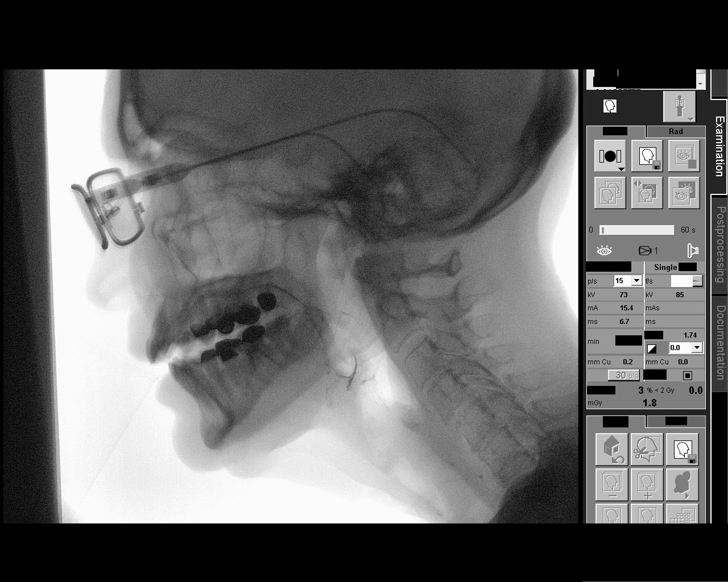

[Series 8: run · 1 of 337 frames shown (8 of 11)]
[frame 51/337]
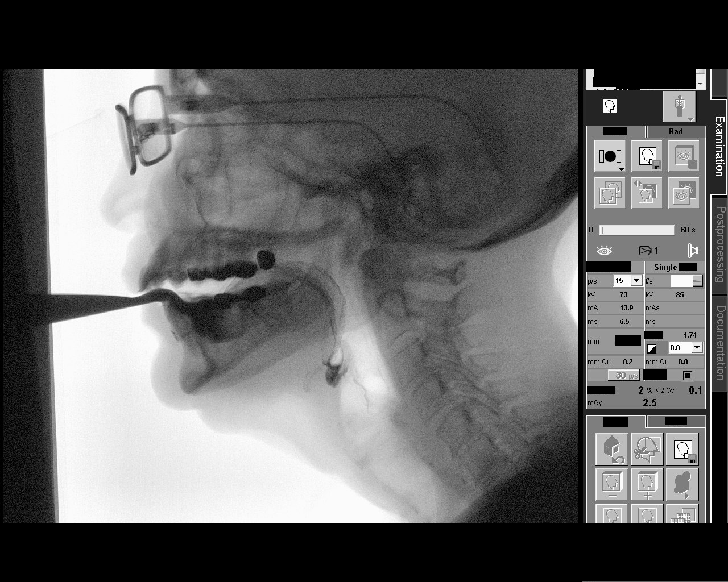

[Series 9: run · 1 of 16 frames shown (9 of 11)]
[frame 3/16]
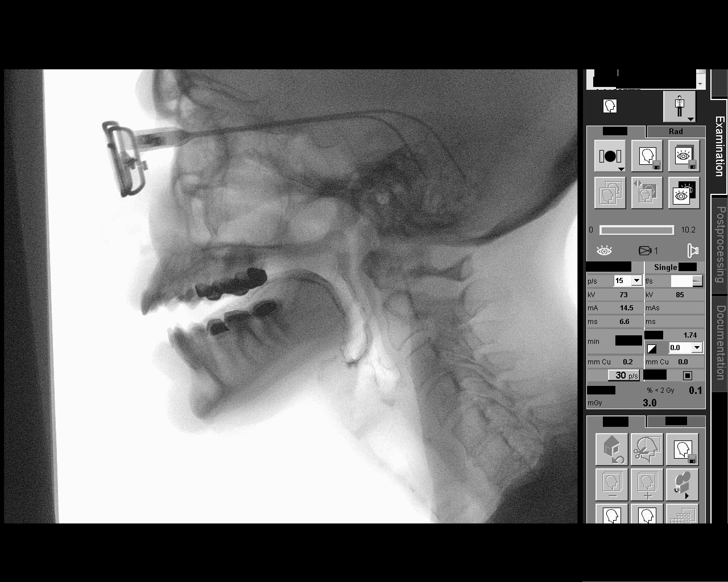

[Series 10: run · 2 of 46 frames shown (10 of 11)]
[frame 7/46]
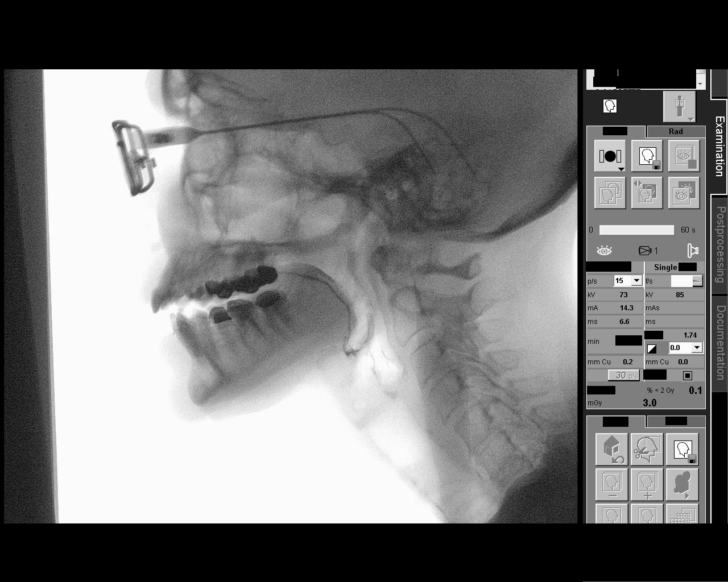
[frame 40/46]
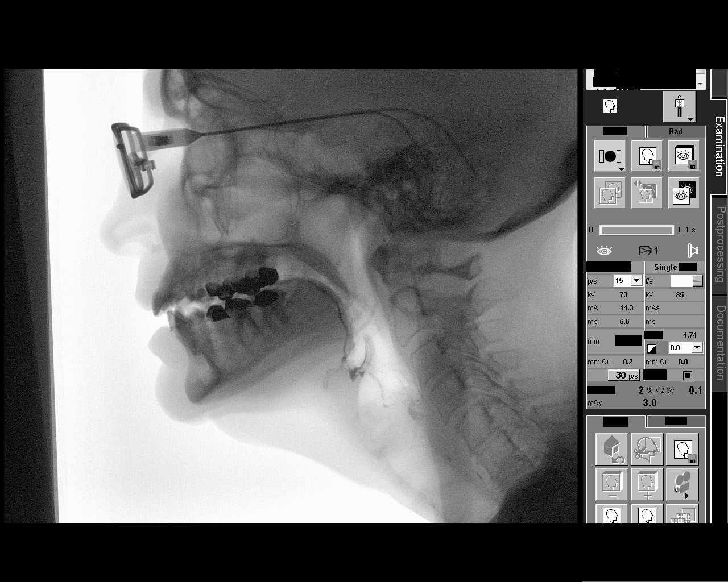

[Series 11: run · 1 of 529 frames shown (11 of 11)]
[frame 503/529]
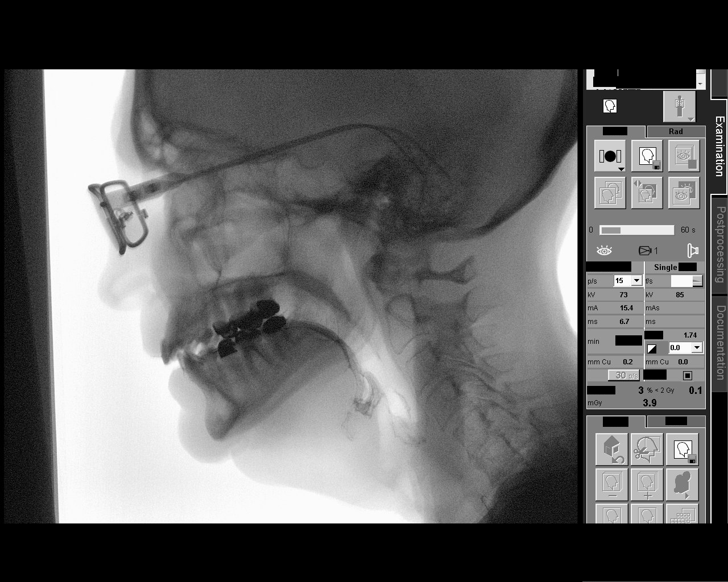

[13 of 24 positions shown; findings below may reference images not displayed]

FINDINGS: Thin liquid- within normal limits

Nectar thick liquid- within normal limits

Bayu Septryand?Cantos within normal limits

Bayu Septryand?Seara with cracker- within normal limits
IMPRESSION: Normal modified barium swallow study.

Please refer to the Speech Pathologists report for complete details
and recommendations.

## 2018-03-27 ENCOUNTER — Ambulatory Visit: Payer: PPO

## 2018-04-17 DIAGNOSIS — E538 Deficiency of other specified B group vitamins: Secondary | ICD-10-CM | POA: Diagnosis not present

## 2018-05-18 DIAGNOSIS — G20 Parkinson's disease: Secondary | ICD-10-CM | POA: Diagnosis not present

## 2018-05-19 DIAGNOSIS — E538 Deficiency of other specified B group vitamins: Secondary | ICD-10-CM | POA: Diagnosis not present

## 2018-06-15 DIAGNOSIS — G20 Parkinson's disease: Secondary | ICD-10-CM | POA: Diagnosis not present

## 2018-06-15 DIAGNOSIS — N3010 Interstitial cystitis (chronic) without hematuria: Secondary | ICD-10-CM | POA: Diagnosis not present

## 2018-06-15 DIAGNOSIS — E538 Deficiency of other specified B group vitamins: Secondary | ICD-10-CM | POA: Diagnosis not present

## 2018-06-15 DIAGNOSIS — M810 Age-related osteoporosis without current pathological fracture: Secondary | ICD-10-CM | POA: Diagnosis not present

## 2018-06-19 DIAGNOSIS — M797 Fibromyalgia: Secondary | ICD-10-CM | POA: Diagnosis not present

## 2018-06-19 DIAGNOSIS — E538 Deficiency of other specified B group vitamins: Secondary | ICD-10-CM | POA: Diagnosis not present

## 2018-06-19 DIAGNOSIS — M3509 Sicca syndrome with other organ involvement: Secondary | ICD-10-CM | POA: Diagnosis not present

## 2018-06-19 DIAGNOSIS — Z0000 Encounter for general adult medical examination without abnormal findings: Secondary | ICD-10-CM | POA: Diagnosis not present

## 2018-06-19 DIAGNOSIS — M810 Age-related osteoporosis without current pathological fracture: Secondary | ICD-10-CM | POA: Diagnosis not present

## 2018-06-19 DIAGNOSIS — G20 Parkinson's disease: Secondary | ICD-10-CM | POA: Diagnosis not present

## 2018-06-19 DIAGNOSIS — F3341 Major depressive disorder, recurrent, in partial remission: Secondary | ICD-10-CM | POA: Diagnosis not present

## 2018-06-19 DIAGNOSIS — N3010 Interstitial cystitis (chronic) without hematuria: Secondary | ICD-10-CM | POA: Diagnosis not present

## 2018-06-19 DIAGNOSIS — K5909 Other constipation: Secondary | ICD-10-CM | POA: Diagnosis not present

## 2018-06-19 DIAGNOSIS — E559 Vitamin D deficiency, unspecified: Secondary | ICD-10-CM | POA: Diagnosis not present

## 2018-06-19 DIAGNOSIS — H02889 Meibomian gland dysfunction of unspecified eye, unspecified eyelid: Secondary | ICD-10-CM | POA: Diagnosis not present

## 2018-07-10 DIAGNOSIS — R300 Dysuria: Secondary | ICD-10-CM | POA: Diagnosis not present

## 2018-07-13 ENCOUNTER — Ambulatory Visit: Payer: PPO | Admitting: Podiatry

## 2018-07-17 ENCOUNTER — Ambulatory Visit: Payer: PPO | Admitting: Podiatry

## 2018-07-17 ENCOUNTER — Encounter: Payer: Self-pay | Admitting: Podiatry

## 2018-07-17 DIAGNOSIS — M79674 Pain in right toe(s): Secondary | ICD-10-CM | POA: Diagnosis not present

## 2018-07-17 DIAGNOSIS — M79675 Pain in left toe(s): Secondary | ICD-10-CM | POA: Diagnosis not present

## 2018-07-17 DIAGNOSIS — B351 Tinea unguium: Secondary | ICD-10-CM | POA: Diagnosis not present

## 2018-07-17 NOTE — Progress Notes (Signed)
Complaint:  Visit Type: Patient returns to my office for continued preventative foot care services. She presents to the office with her daughter.   Complaint: Patient states" my nails have grown long and thick and become painful to walk and wear shoes" Patient has been diagnosed with Parkinsons.  . The patient presents for preventative foot care services. No changes to ROS  Podiatric Exam: Vascular: dorsalis pedis and posterior tibial pulses are palpable bilateral. Capillary return is immediate. Temperature gradient is WNL. Skin turgor WNL  Sensorium: Normal Semmes Weinstein monofilament test. Normal tactile sensation bilaterally. Nail Exam: Pt has thick disfigured discolored nails with subungual debris noted bilateral entire nail hallux through fifth toenails Ulcer Exam: There is no evidence of ulcer or pre-ulcerative changes or infection. Orthopedic Exam: Muscle tone and strength are WNL. No limitations in general ROM. No crepitus or effusions noted.Fixed dorsiflexed position left hallux. Skin: No Porokeratosis. No infection or ulcers  Diagnosis:  Onychomycosis, , Pain in right toe, pain in left toes  Treatment & Plan Procedures and Treatment: Consent by patient was obtained for treatment procedures.   Debridement of mycotic and hypertrophic toenails, 1 through 5 bilateral and clearing of subungual debris. No ulceration, no infection noted.  Return Visit-Office Procedure: Patient instructed to return to the office for a follow up visit 5 months for continued evaluation and treatment.    Gardiner Barefoot DPM

## 2018-07-20 DIAGNOSIS — E538 Deficiency of other specified B group vitamins: Secondary | ICD-10-CM | POA: Diagnosis not present

## 2018-08-03 ENCOUNTER — Encounter: Payer: Self-pay | Admitting: Internal Medicine

## 2018-08-21 DIAGNOSIS — E538 Deficiency of other specified B group vitamins: Secondary | ICD-10-CM | POA: Diagnosis not present

## 2018-09-12 DIAGNOSIS — R52 Pain, unspecified: Secondary | ICD-10-CM | POA: Diagnosis not present

## 2018-09-12 DIAGNOSIS — W19XXXA Unspecified fall, initial encounter: Secondary | ICD-10-CM | POA: Diagnosis not present

## 2018-09-13 ENCOUNTER — Emergency Department
Admission: EM | Admit: 2018-09-13 | Discharge: 2018-09-13 | Disposition: A | Discharge status: Home / Self Care | Payer: PPO | Attending: Emergency Medicine | Admitting: Emergency Medicine

## 2018-09-13 ENCOUNTER — Other Ambulatory Visit: Payer: Self-pay

## 2018-09-13 ENCOUNTER — Encounter: Payer: Self-pay | Admitting: *Deleted

## 2018-09-13 ENCOUNTER — Emergency Department: Payer: PPO

## 2018-09-13 DIAGNOSIS — Y92122 Bedroom in nursing home as the place of occurrence of the external cause: Secondary | ICD-10-CM | POA: Diagnosis not present

## 2018-09-13 DIAGNOSIS — Y9389 Activity, other specified: Secondary | ICD-10-CM | POA: Diagnosis not present

## 2018-09-13 DIAGNOSIS — W06XXXA Fall from bed, initial encounter: Secondary | ICD-10-CM | POA: Insufficient documentation

## 2018-09-13 DIAGNOSIS — S0003XA Contusion of scalp, initial encounter: Secondary | ICD-10-CM | POA: Diagnosis not present

## 2018-09-13 DIAGNOSIS — G20 Parkinson's disease: Secondary | ICD-10-CM | POA: Diagnosis not present

## 2018-09-13 DIAGNOSIS — Z87891 Personal history of nicotine dependence: Secondary | ICD-10-CM | POA: Diagnosis not present

## 2018-09-13 DIAGNOSIS — R9431 Abnormal electrocardiogram [ECG] [EKG]: Secondary | ICD-10-CM | POA: Diagnosis not present

## 2018-09-13 DIAGNOSIS — Z79899 Other long term (current) drug therapy: Secondary | ICD-10-CM | POA: Insufficient documentation

## 2018-09-13 DIAGNOSIS — F0280 Dementia in other diseases classified elsewhere without behavioral disturbance: Secondary | ICD-10-CM | POA: Insufficient documentation

## 2018-09-13 DIAGNOSIS — W19XXXA Unspecified fall, initial encounter: Secondary | ICD-10-CM

## 2018-09-13 DIAGNOSIS — Z7401 Bed confinement status: Secondary | ICD-10-CM | POA: Diagnosis not present

## 2018-09-13 DIAGNOSIS — S0990XA Unspecified injury of head, initial encounter: Secondary | ICD-10-CM

## 2018-09-13 DIAGNOSIS — Y999 Unspecified external cause status: Secondary | ICD-10-CM | POA: Diagnosis not present

## 2018-09-13 DIAGNOSIS — M2550 Pain in unspecified joint: Secondary | ICD-10-CM | POA: Diagnosis not present

## 2018-09-13 NOTE — ED Notes (Signed)
Pt updated that CT would be coming to take pt for scan. Warm blankets provided and bed is locked and in lowest position. Call bell in reach. PT in NAD at this time.

## 2018-09-13 NOTE — ED Triage Notes (Addendum)
Pt to ED from [facility] after falling out of bed with head trauma. Pt does not take blood thinners, no breaks in the skin noted but a circular reddened area noted at the top of her head. . No obvious neuro deficits. Pt has a blue colored substance on her face and on her left hand that she verbalized is uribel (urinary medication). No evidence of vomiting and per EMS staff reports pt has a tendency to drool which has caused the coloring to run onto her face and hand.   Vitals with EMS:  97% RA HR 71  134/67

## 2018-09-13 NOTE — ED Notes (Signed)
Pt is being discharged back to [facility] via [place]. Pt is AOx3, VSS, she does not c/o any pain or show any signs of distress. AVS was given and explained to [facility] transport and Joslyn Devon, Therapist, sports at [facility], they all verbalized understanding of the AVS and discharge plan of care.

## 2018-09-13 NOTE — ED Notes (Signed)
Pt not able to provide discharge signature at this time. Will be transported by [facility].

## 2018-09-13 NOTE — Discharge Instructions (Addendum)
Return to the ER for worsening symptoms, persistent vomiting, lethargy or other concerns. °

## 2018-09-13 NOTE — ED Provider Notes (Signed)
[facility] Emergency Department Provider Note   ____________________________________________   First MD Initiated Contact with Patient 09/13/18 0015     (approximate)  I have reviewed the triage vital signs and the nursing notes.   HISTORY  Chief Complaint Fall  Level V caveat: Limited by dementia  HPI Kaitlyn Church is a 78 y.o. female brought to the ED via EMS from [facility] status post falling out of bed.  Patient found with minor head trauma.  No use of anticoagulants.  Patient voices no complaints.  Denies patient changes, neck pain, chest pain, shortness of breath, nausea/vomiting, abdominal pain.   Past Medical History:  Diagnosis Date  . Abdominal pain, right upper quadrant   . Allergic rhinitis   . Anxiety   . Depression   . Esophageal motility disorder   . Fibromyalgia   . Gait disturbance   . GERD (gastroesophageal reflux disease)   . Grief reaction   . Hemorrhoids, internal, thrombosed   . Interstitial cystitis   . Irritable bowel syndrome   . Osteoporosis   . Parkinson disease ([place])   . Restless leg syndrome   . Shingles   . Sjogren's syndrome ([place])    ?  Marland Kitchen Trochanteric bursitis    bilateral  . Vaginitis     Patient Active Problem List   Diagnosis Date Noted  . Arthritis 06/01/2016  . B12 deficiency 06/01/2016  . Osteoporosis, post-menopausal 06/01/2016  . Parkinson disease ([place]) 06/01/2016  . Sjogren's syndrome ([place]) 06/01/2016  . Chronic cystitis 06/01/2016  . Dysuria 04/16/2016  . Recurrent major depressive disorder, in partial remission ([place]) 01/07/2016  . Chronic constipation 09/24/2014  . Dysphagia 09/24/2014  . Raynaud's phenomenon without gangrene 09/24/2014  . Chalazion 04/20/2013  . Bilateral dry eyes 03/06/2012  . Blepharitis of both eyes 03/06/2012  . MGD (meibomian gland dysfunction) 03/06/2012  . DYSPHAGIA PHARYNGEAL PHASE 07/21/2009  . ESOPHAGEAL MOTILITY DISORDER 02/03/2009  .  FIBROMYALGIA 02/03/2009  . TROCHANTERIC BURSITIS, BILATERAL 06/21/2008  . HAND PAIN 06/21/2008  . GAIT DISTURBANCE 06/21/2008  . ABDOMINAL, RIGHT UPPER, PAIN 06/21/2008  . GRIEF REACTION 05/30/2008  . VAGINITIS 05/30/2008  . OSTEOPOROSIS 05/30/2008  . HEMORRHOIDS, INTERNAL, THROMBOSED 03/22/2008  . CHANGE IN BOWELS 03/22/2008  . FATIGUE 03/10/2007  . RESTLESS LEG SYNDROME 02/19/2007  . Irritable bowel syndrome 02/19/2007  . ANXIETY 02/16/2007  . DEPRESSION 02/16/2007  . ALLERGIC RHINITIS 02/16/2007  . GERD 02/16/2007  . INTERSTITIAL CYSTITIS 02/16/2007  . [place] SYNDROME 02/16/2007  . FIBROSITIS 02/16/2007    Past Surgical History:  Procedure Laterality Date  . ABDOMINAL HYSTERECTOMY  10/2002  . APPENDECTOMY  11/1969  . CYSTOCELE REPAIR  1980  . ESOPHAGOGASTRODUODENOSCOPY  01/2001   nl  . ESOPHAGOGASTRODUODENOSCOPY  09/2006   Normal Carlean Purl)  . EXPLORATORY LAPAROTOMY  09/1969  . HERNIA REPAIR  1980  . NASAL SEPTUM SURGERY  11/1976  . POLYPECTOMY  03/1972   Bladder  . [place]  . TONSILLECTOMY  1948    Prior to Admission medications   Medication Sig Start Date End Date Taking? Authorizing Provider  acetaminophen (TYLENOL) 500 MG tablet Take 500 mg by mouth every 4 (four) hours as needed.    [provider]  ALPRAZolam Duanne Moron) 0.25 MG tablet Take by mouth 4 (four) times daily.     [provider]  alum & mag hydroxide-simeth (MAALOX/MYLANTA) 200-200-20 MG/5ML suspension Take 30 mLs by mouth every 6 (six) hours as needed for indigestion or heartburn.  [provider]  antiseptic oral rinse (BIOTENE) LIQD 15 mLs by Mouth Rinse route as needed for dry mouth.    [provider]  bisacodyl (DULCOLAX) 10 MG suppository Place 10 mg rectally daily as needed for moderate constipation.    [provider]  carbidopa-levodopa (SINEMET CR) 50-200 MG tablet Take 1 tablet by mouth daily.    [provider]    Carbidopa-Levodopa ER (SINEMET CR) 25-100 MG tablet controlled release Take 1.5 tablets by mouth 3 (three) times daily.    [provider]  desonide (DESOWEN) 0.05 % cream Apply 1 application topically 2 (two) times daily as needed.     [provider]  dimenhyDRINATE (DRAMAMINE) 50 MG tablet Take 50 mg by mouth every 4 (four) hours as needed.    [provider]  estrogens, conjugated, (PREMARIN) 0.625 MG tablet Take 0.625 mg by mouth daily. Take daily for 21 days then do not take for 7 days.     [provider]  FLUoxetine (PROZAC) 10 MG tablet Take 5 mg by mouth daily.      [provider]  Glycerin (OASIS MOISTURIZING MOUTH SPRAY) 35 % LIQD Use as directed 4 sprays in the mouth or throat at bedtime.    [provider]  Glycerin, Laxative, (FLEET LIQUID GLYCERIN SUPP RE) Place 1 suppository rectally daily.    [provider]  guaifenesin (ROBAFEN) 100 MG/5ML syrup Take 200 mg by mouth 4 (four) times daily as needed for cough.    [provider]  Homeopathic Products (ARNICARE ARNICA EX) Apply 1 application topically daily as needed.    [provider]  ibuprofen (ADVIL,MOTRIN) 200 MG tablet Take 200 mg by mouth 3 (three) times daily.     [provider]  Lifitegrast Shirley Friar) 5 % SOLN Apply 1 drop to eye 2 (two) times daily.    [provider]  loperamide (IMODIUM) 2 MG capsule Take 2 mg by mouth as needed for diarrhea or loose stools.    [provider]  loratadine (CLARITIN) 10 MG tablet Take 5 mg by mouth daily as needed.     [provider]  magnesium hydroxide (MILK OF MAGNESIA) 400 MG/5ML suspension Take 30 mLs by mouth at bedtime as needed for mild constipation.    [provider]  magnesium oxide (MAG-OX) 400 MG tablet Take 400 mg by mouth at bedtime as needed.    [provider]  Meth-Hyo-M Bl-Na Phos-Ph Sal (URIBEL) 118 MG CAPS Take by mouth.    [provider]  Neomycin-Bacitracin-Polymyxin (TRIPLE ANTIBIOTIC) [phone] OINT Apply topically as directed.    [provider]  shark liver oil-cocoa butter (PREPARATION H) 0.25-88.44 % suppository Place 1 suppository rectally as needed for hemorrhoids.    [provider]  Wheat Dextrin (BENEFIBER) POWD Take 10 mLs by mouth 2 (two) times daily as needed.    [provider]    Allergies Lidocaine; Other; Propoxyphene; Ascorbic acid; Ascorbic acid; Azithromycin; Basil oil; Calcium; Celecoxib; Cephalexin; Dexamethasone; Doxycycline; Itraconazole; Meperidine hcl; Mirabegron; Morphine; Morphine sulfate; Nitrofurantoin; [place] oil; Pantoprazole sodium; Penicillins; Propoxyphene hcl; Sulfa antibiotics; Sulfamethoxazole-trimethoprim; and Thimerosal  Family History  Problem Relation Age of Onset  . Breast cancer Mother   . Alcohol abuse Father   . Pneumonia Father   . Bipolar disorder Son   . Coronary artery disease Neg Hx   . Diabetes Neg Hx   . Hypertension Neg Hx   . Colon cancer Neg Hx   .  Prostate cancer Neg Hx   . Kidney cancer Neg Hx     Social History Social History   Tobacco Use  . Smoking status: Former Research scientist (life sciences)  . Smokeless tobacco: Never Used  Substance Use Topics  . Alcohol use: No  . Drug use: No    Review of Systems  Constitutional: No fever/chills Eyes: No visual changes. ENT: No sore throat. Cardiovascular: Denies chest pain. Respiratory: Denies shortness of breath. Gastrointestinal: No abdominal pain.  No nausea, no vomiting.  No diarrhea.  No constipation. Genitourinary: Negative for dysuria. Musculoskeletal: Negative for back pain. Skin: Negative for rash. Neurological: Positive for scalp abrasion.  Negative for headaches, focal weakness or numbness.   ____________________________________________   PHYSICAL EXAM:  VITAL SIGNS: ED Triage Vitals  Enc Vitals Group     BP 09/13/18 0013 (!) 143/72     Pulse Rate 09/13/18  0013 69     Resp 09/13/18 0013 14     Temp 09/13/18 0013 (!) 96.2 F (35.7 C)     Temp Source 09/13/18 0013 Axillary     SpO2 09/13/18 0013 100 %     Weight 09/13/18 0011 102 lb (46.3 kg)     Height --      Head Circumference --      Peak Flow --      Pain Score --      Pain Loc --      Pain Edu? --      Excl. in [place]? --     Constitutional: Alert and oriented.  Elderly appearing and in no acute distress. Eyes: Conjunctivae are normal. PERRL. EOMI. Head: Abrasion to top of scalp. Nose: Atraumatic. Mouth/Throat: Mucous membranes are moist.  No dental malocclusion. Neck: No stridor.  No cervical spine tenderness to palpation.  No step-offs or deformities noted. Cardiovascular: Normal rate, regular rhythm. Grossly normal heart sounds.  Good peripheral circulation. Respiratory: Normal respiratory effort.  No retractions. Lungs CTAB. Gastrointestinal: Soft and nontender. No distention. No abdominal bruits. No CVA tenderness. Musculoskeletal: No lower extremity tenderness nor edema.  No joint effusions. Neurologic: Alert and oriented to person.  Normal speech and language. No gross focal neurologic deficits are appreciated. MAEx4. Skin:  Skin is warm, dry and intact. No rash noted. Psychiatric: Mood and affect are normal. Speech and behavior are normal.  ____________________________________________   LABS (all labs ordered are listed, but only abnormal results are displayed)  Labs Reviewed - No data to display ____________________________________________  EKG  ED ECG REPORT I, SUNG,JADE J, the attending physician, personally viewed and interpreted this ECG.   Date: 09/13/2018  EKG Time: 0014  Rate: 67  Rhythm: normal EKG, normal sinus rhythm  Axis: Normal  Intervals:none  ST&T Change: Nonspecific  ____________________________________________  RADIOLOGY  ED MD interpretation: No ICH, scalp hematoma  Official radiology report(s): Ct Head Wo Contrast  Result Date:  09/13/2018 CLINICAL DATA:  Fall while on anti coagulation therapy. EXAM: CT HEAD WITHOUT CONTRAST TECHNIQUE: Contiguous axial images were obtained from the base of the skull through the vertex without intravenous contrast. COMPARISON:  08/11/2017 head CT FINDINGS: Brain: There is no mass, hemorrhage or extra-axial collection. The size and configuration of the ventricles and extra-axial CSF spaces are normal. There is hypoattenuation of the white matter, most commonly indicating chronic small vessel disease. Vascular: No abnormal hyperdensity of the major intracranial arteries or dural venous sinuses. No intracranial atherosclerosis. Skull: Small hematoma of the scalp vertex.  No skull fracture. Sinuses/Orbits: No fluid levels or advanced  mucosal thickening of the visualized paranasal sinuses. No mastoid or middle ear effusion. The orbits are normal. IMPRESSION: Small scalp vertex hematoma without skull fracture or acute intracranial abnormality. Electronically Signed   By: Ulyses Jarred M.D.   On: 09/13/2018 00:54    ____________________________________________   PROCEDURES  Procedure(s) performed: None  Procedures  Critical Care performed: No  ____________________________________________   INITIAL IMPRESSION / ASSESSMENT AND PLAN / ED COURSE  As part of my medical decision making, I reviewed the following data within the [facility] notes reviewed and incorporated, EKG interpreted, Old chart reviewed, Radiograph reviewed  and Notes from prior ED visits   78 year old female who presents status post mechanical fall from [place].  Abrasion to top of scalp.  Will obtain CT head to evaluate for intracranial hemorrhage.   Clinical Course as of Sep 13 108  Wed Sep 13, 2018  0110 Updated patient of CT results.  Will discharge back to facility.   [JS]    Clinical Course User Index [JS] Paulette Blanch, MD      ____________________________________________   FINAL CLINICAL IMPRESSION(S) / ED DIAGNOSES  Final diagnoses:  Fall, initial encounter  Injury of head, initial encounter  Contusion of scalp, initial encounter     ED Discharge Orders    None       Note:  This document was prepared using Dragon voice recognition software and may include unintentional dictation errors.    Paulette Blanch, MD 09/13/18 [phone]

## 2018-09-20 DIAGNOSIS — H04123 Dry eye syndrome of bilateral lacrimal glands: Secondary | ICD-10-CM | POA: Diagnosis not present

## 2018-09-21 DIAGNOSIS — E538 Deficiency of other specified B group vitamins: Secondary | ICD-10-CM | POA: Diagnosis not present

## 2018-10-03 DIAGNOSIS — N6320 Unspecified lump in the left breast, unspecified quadrant: Secondary | ICD-10-CM | POA: Diagnosis not present

## 2018-10-03 DIAGNOSIS — F3341 Major depressive disorder, recurrent, in partial remission: Secondary | ICD-10-CM | POA: Diagnosis not present

## 2018-10-03 DIAGNOSIS — R1312 Dysphagia, oropharyngeal phase: Secondary | ICD-10-CM | POA: Diagnosis not present

## 2018-10-03 DIAGNOSIS — E538 Deficiency of other specified B group vitamins: Secondary | ICD-10-CM | POA: Diagnosis not present

## 2018-10-03 DIAGNOSIS — G20 Parkinson's disease: Secondary | ICD-10-CM | POA: Diagnosis not present

## 2018-10-04 ENCOUNTER — Other Ambulatory Visit: Payer: Self-pay | Admitting: Internal Medicine

## 2018-10-04 DIAGNOSIS — N6320 Unspecified lump in the left breast, unspecified quadrant: Secondary | ICD-10-CM

## 2018-10-05 ENCOUNTER — Other Ambulatory Visit: Payer: Self-pay | Admitting: Internal Medicine

## 2018-10-05 DIAGNOSIS — R1312 Dysphagia, oropharyngeal phase: Secondary | ICD-10-CM

## 2018-10-05 DIAGNOSIS — G20 Parkinson's disease: Secondary | ICD-10-CM

## 2018-10-06 ENCOUNTER — Other Ambulatory Visit: Payer: Self-pay | Admitting: Internal Medicine

## 2018-10-06 DIAGNOSIS — N6320 Unspecified lump in the left breast, unspecified quadrant: Secondary | ICD-10-CM

## 2018-10-11 ENCOUNTER — Other Ambulatory Visit: Payer: Self-pay | Admitting: Internal Medicine

## 2018-10-11 ENCOUNTER — Ambulatory Visit
Admission: RE | Admit: 2018-10-11 | Discharge: 2018-10-11 | Disposition: A | Discharge status: Home / Self Care | Payer: PPO | Source: Ambulatory Visit | Attending: Internal Medicine | Admitting: Internal Medicine

## 2018-10-11 DIAGNOSIS — N6320 Unspecified lump in the left breast, unspecified quadrant: Secondary | ICD-10-CM | POA: Insufficient documentation

## 2018-10-11 DIAGNOSIS — N6011 Diffuse cystic mastopathy of right breast: Secondary | ICD-10-CM | POA: Diagnosis not present

## 2018-10-11 DIAGNOSIS — N6459 Other signs and symptoms in breast: Secondary | ICD-10-CM | POA: Diagnosis not present

## 2018-10-11 DIAGNOSIS — R928 Other abnormal and inconclusive findings on diagnostic imaging of breast: Secondary | ICD-10-CM | POA: Diagnosis not present

## 2018-10-11 DIAGNOSIS — N6012 Diffuse cystic mastopathy of left breast: Secondary | ICD-10-CM | POA: Diagnosis not present

## 2018-10-24 DIAGNOSIS — E538 Deficiency of other specified B group vitamins: Secondary | ICD-10-CM | POA: Diagnosis not present

## 2018-10-27 DIAGNOSIS — E538 Deficiency of other specified B group vitamins: Secondary | ICD-10-CM | POA: Diagnosis not present

## 2018-10-27 DIAGNOSIS — N6320 Unspecified lump in the left breast, unspecified quadrant: Secondary | ICD-10-CM | POA: Diagnosis not present

## 2018-10-27 DIAGNOSIS — F429 Obsessive-compulsive disorder, unspecified: Secondary | ICD-10-CM | POA: Diagnosis not present

## 2018-10-27 DIAGNOSIS — Z9181 History of falling: Secondary | ICD-10-CM | POA: Diagnosis not present

## 2018-10-27 DIAGNOSIS — M519 Unspecified thoracic, thoracolumbar and lumbosacral intervertebral disc disorder: Secondary | ICD-10-CM | POA: Diagnosis not present

## 2018-10-27 DIAGNOSIS — H04129 Dry eye syndrome of unspecified lacrimal gland: Secondary | ICD-10-CM | POA: Diagnosis not present

## 2018-10-27 DIAGNOSIS — M3500 Sicca syndrome, unspecified: Secondary | ICD-10-CM | POA: Diagnosis not present

## 2018-10-27 DIAGNOSIS — G20 Parkinson's disease: Secondary | ICD-10-CM | POA: Diagnosis not present

## 2018-10-27 DIAGNOSIS — K589 Irritable bowel syndrome without diarrhea: Secondary | ICD-10-CM | POA: Diagnosis not present

## 2018-10-27 DIAGNOSIS — M797 Fibromyalgia: Secondary | ICD-10-CM | POA: Diagnosis not present

## 2018-10-27 DIAGNOSIS — F909 Attention-deficit hyperactivity disorder, unspecified type: Secondary | ICD-10-CM | POA: Diagnosis not present

## 2018-10-27 DIAGNOSIS — M810 Age-related osteoporosis without current pathological fracture: Secondary | ICD-10-CM | POA: Diagnosis not present

## 2018-10-27 DIAGNOSIS — H5111 Convergence insufficiency: Secondary | ICD-10-CM | POA: Diagnosis not present

## 2018-10-27 DIAGNOSIS — R399 Unspecified symptoms and signs involving the genitourinary system: Secondary | ICD-10-CM | POA: Diagnosis not present

## 2018-10-27 DIAGNOSIS — R1312 Dysphagia, oropharyngeal phase: Secondary | ICD-10-CM | POA: Diagnosis not present

## 2018-10-27 DIAGNOSIS — F3341 Major depressive disorder, recurrent, in partial remission: Secondary | ICD-10-CM | POA: Diagnosis not present

## 2018-10-27 DIAGNOSIS — M419 Scoliosis, unspecified: Secondary | ICD-10-CM | POA: Diagnosis not present

## 2018-10-27 DIAGNOSIS — S0083XD Contusion of other part of head, subsequent encounter: Secondary | ICD-10-CM | POA: Diagnosis not present

## 2018-10-27 DIAGNOSIS — F419 Anxiety disorder, unspecified: Secondary | ICD-10-CM | POA: Diagnosis not present

## 2018-10-27 DIAGNOSIS — M1991 Primary osteoarthritis, unspecified site: Secondary | ICD-10-CM | POA: Diagnosis not present

## 2018-10-27 DIAGNOSIS — Z87440 Personal history of urinary (tract) infections: Secondary | ICD-10-CM | POA: Diagnosis not present

## 2018-11-03 ENCOUNTER — Ambulatory Visit
Admission: RE | Admit: 2018-11-03 | Discharge: 2018-11-03 | Disposition: A | Discharge status: Home / Self Care | Payer: PPO | Source: Ambulatory Visit | Attending: Internal Medicine | Admitting: Internal Medicine

## 2018-11-03 DIAGNOSIS — R1312 Dysphagia, oropharyngeal phase: Secondary | ICD-10-CM

## 2018-11-03 DIAGNOSIS — G20 Parkinson's disease: Secondary | ICD-10-CM | POA: Diagnosis not present

## 2018-11-03 DIAGNOSIS — R1310 Dysphagia, unspecified: Secondary | ICD-10-CM | POA: Diagnosis not present

## 2018-11-03 DIAGNOSIS — G20A1 Parkinson's disease without dyskinesia, without mention of fluctuations: Secondary | ICD-10-CM

## 2018-11-03 NOTE — Therapy (Signed)
[facility] [place], [place], [address] Phone: [phone]   Fax:     Modified Barium Swallow  Patient Details  Name: Kaitlyn Church MRN: [id number] Date of Birth: [birth year] No data recorded  Encounter Date: 11/03/2018  End of Session - 11/03/18 1428    Visit Number  1    Number of Visits  1    Date for SLP Re-Evaluation  11/03/18    SLP Start Time  1215    SLP Stop Time   1310    SLP Time Calculation (min)  55 min       Past Medical History:  Diagnosis Date  . Abdominal pain, right upper quadrant   . Allergic rhinitis   . Anxiety   . Depression   . Esophageal motility disorder   . Fibromyalgia   . Gait disturbance   . GERD (gastroesophageal reflux disease)   . Grief reaction   . Hemorrhoids, internal, thrombosed   . Interstitial cystitis   . Irritable bowel syndrome   . Osteoporosis   . Parkinson disease ([place])   . Restless leg syndrome   . Shingles   . Sjogren's syndrome ([place])    ?  Marland Kitchen Trochanteric bursitis    bilateral  . Vaginitis     Past Surgical History:  Procedure Laterality Date  . ABDOMINAL HYSTERECTOMY  10/2002  . APPENDECTOMY  11/1969  . CYSTOCELE REPAIR  1980  . ESOPHAGOGASTRODUODENOSCOPY  01/2001   nl  . ESOPHAGOGASTRODUODENOSCOPY  09/2006   Normal Carlean Purl)  . EXPLORATORY LAPAROTOMY  09/1969  . HERNIA REPAIR  1980  . NASAL SEPTUM SURGERY  11/1976  . POLYPECTOMY  03/1972   Bladder  . [place]  . TONSILLECTOMY  1948    There were no vitals filed for this visit.   Subjective: Patient behavior: (alertness, ability to follow instructions, etc.): Patient is alert and able to follow directions.  She is aphonic, but did voice with max encouragement.   Chief complaint:  "tongue won't work right" "my throat tightens up"              Objective:  Radiological Procedure: A videoflouroscopic evaluation of oral-preparatory, reflex initiation, and pharyngeal  phases of the swallow was performed; as well as a screening of the upper esophageal phase.   I. POSTURE: Upright in MBS chair   II. VIEW: Lateral   III. COMPENSATORY STRATEGIES: N/A   IV. BOLUSES ADMINISTERED:         Thin Liquid: 1 small sips, 5 rapid, consecutive straw drinks         Nectar-thick Liquid: 1 moderate size bolus              Puree: 2 teaspoon presentations         Mechanical Soft: 1/4 graham cracker in applesauce   V. RESULTS OF EVALUATION: A. ORAL PREPARATORY PHASE: (The lips, tongue, and velum are observed for strength and coordination)       **Overall Severity Rating: Mild; decreased bolus cohesion, delayed posterior transfer; adequate mastication of cracker in soft, moist substrate   B. SWALLOW INITIATION/REFLEX: (The reflex is normal if "triggered" by the time the bolus reached the base of the tongue)       **Overall Severity Rating: Moderate; triggering at the valleculae for solids and while falling from the valleculae to the pyriform sinuses for thin liquids.   C. PHARYNGEAL PHASE: (Pharyngeal function is normal if the bolus shows rapid,  smooth, and continuous transit through the pharynx and there is no pharyngeal residue after the swallow)       **Overall Severity Rating: Mild; decreased tongue base retraction with mild vallecular residue   D. LARYNGEAL PENETRATION: (Material entering into the laryngeal inlet/vestibule but not aspirated) Thin liquid with straw 1 of 5 sips- transient   E. ASPIRATION: None   F. ESOPHAGEAL PHASE: (Screening of the upper esophagus)- not viewed; patient has history of esophageal dysmotility   ASSESSMENT: 78 year old woman, with multiple diagnoses including Parkinson's, GERD, and Sjogren's, is presenting with mild oropharyngeal dysphagia primarily characterized by slow / disorganized oral management, delayed pharyngeal swallow initiation, and reduced pharyngeal pressure generation, mild vallecular residue, and transient laryngeal  penetration X1.  Timing of the pharyngeal swallow is delayed, triggering at the valleculae for solids and while falling from the valleculae to the pyriform sinuses for liquids.  There was no observed tracheal aspiration.  this study is minimally worse than the study done 03/01/2016.  The patient is not at significant risk for prandial aspiration at this time.  In view of her diagnoses, the patient is at risk for decline in oropharyngeal swallow function.  Today, she was able to [facility] a cookie in a soft, moist substrate and take rapid, consecutive drinks of thin liquid via straw.   The patient can safely be encouraged to eat/drink more.  Please monitor for signs of aspiration including increased coughing with meals, wet or gurgly sound to voice, or temperature spikes.  Follow reflux precautions and maintain stringent oral care.  If she will participate, the patient would benefit from high effort/high intensity vocal exercises (such as the LSVT-LOUD program).  LSVT has been shown to improve neuromuscular control of the entire upper aerodigestive tract, improving oral tongue and tongue base function during the oral and pharyngeal phases of swallowing as well as improving vocal intensity.     PLAN/RECOMMENDATIONS:               A. Diet: Regular- may prefer soft/moist foods               B. Swallowing Precautions: Standard swallowing precautions; follow reflux precautions; monitor for signs of aspiration; stringent oral care               C. Recommended consultation to: follow up with MDs as scheduled               D. Therapy recommendations: LSVT-LOUD               E. Results and recommendations were discussed with the patient and a friend immediately following the study and the final report routed to referring MD.   Patient will benefit from skilled therapeutic intervention in order to improve the following deficits and impairments:   Oropharyngeal dysphagia - Plan: DG SWALLOW FUNC OP MEDICARE SPEECH  PATH, DG SWALLOW FUNC OP MEDICARE SPEECH PATH  Parkinson disease ([place]) - Plan: DG SWALLOW FUNC OP MEDICARE SPEECH PATH, DG SWALLOW FUNC OP MEDICARE SPEECH PATH        Problem List Patient Active Problem List   Diagnosis Date Noted  . Arthritis 06/01/2016  . B12 deficiency 06/01/2016  . Osteoporosis, post-menopausal 06/01/2016  . Parkinson disease ([place]) 06/01/2016  . Sjogren's syndrome ([place]) 06/01/2016  . Chronic cystitis 06/01/2016  . Dysuria 04/16/2016  . Recurrent major depressive disorder, in partial remission ([place]) 01/07/2016  . Chronic constipation 09/24/2014  . Dysphagia 09/24/2014  . Raynaud's phenomenon without gangrene 09/24/2014  . Chalazion  04/20/2013  . Bilateral dry eyes 03/06/2012  . Blepharitis of both eyes 03/06/2012  . MGD (meibomian gland dysfunction) 03/06/2012  . DYSPHAGIA PHARYNGEAL PHASE 07/21/2009  . ESOPHAGEAL MOTILITY DISORDER 02/03/2009  . FIBROMYALGIA 02/03/2009  . TROCHANTERIC BURSITIS, BILATERAL 06/21/2008  . HAND PAIN 06/21/2008  . GAIT DISTURBANCE 06/21/2008  . ABDOMINAL, RIGHT UPPER, PAIN 06/21/2008  . GRIEF REACTION 05/30/2008  . VAGINITIS 05/30/2008  . OSTEOPOROSIS 05/30/2008  . HEMORRHOIDS, INTERNAL, THROMBOSED 03/22/2008  . CHANGE IN BOWELS 03/22/2008  . FATIGUE 03/10/2007  . RESTLESS LEG SYNDROME 02/19/2007  . Irritable bowel syndrome 02/19/2007  . ANXIETY 02/16/2007  . DEPRESSION 02/16/2007  . ALLERGIC RHINITIS 02/16/2007  . GERD 02/16/2007  . INTERSTITIAL CYSTITIS 02/16/2007  . [place] SYNDROME 02/16/2007  . FIBROSITIS 02/16/2007   Leroy Sea, MS/CCC- SLP  Lou Miner 11/03/2018, 2:28 PM  [facility] [place], [place], [address] Phone: [phone]   Fax:     Name: CHARYL MINERVINI MRN: [id number] Date of Birth: 01-07-1941

## 2018-11-04 DIAGNOSIS — H5111 Convergence insufficiency: Secondary | ICD-10-CM | POA: Diagnosis not present

## 2018-11-04 DIAGNOSIS — H04129 Dry eye syndrome of unspecified lacrimal gland: Secondary | ICD-10-CM | POA: Diagnosis not present

## 2018-11-04 DIAGNOSIS — G20 Parkinson's disease: Secondary | ICD-10-CM | POA: Diagnosis not present

## 2018-11-04 DIAGNOSIS — M519 Unspecified thoracic, thoracolumbar and lumbosacral intervertebral disc disorder: Secondary | ICD-10-CM | POA: Diagnosis not present

## 2018-11-04 DIAGNOSIS — M3500 Sicca syndrome, unspecified: Secondary | ICD-10-CM | POA: Diagnosis not present

## 2018-11-04 DIAGNOSIS — Z87440 Personal history of urinary (tract) infections: Secondary | ICD-10-CM | POA: Diagnosis not present

## 2018-11-04 DIAGNOSIS — F419 Anxiety disorder, unspecified: Secondary | ICD-10-CM | POA: Diagnosis not present

## 2018-11-04 DIAGNOSIS — F3341 Major depressive disorder, recurrent, in partial remission: Secondary | ICD-10-CM | POA: Diagnosis not present

## 2018-11-04 DIAGNOSIS — R1312 Dysphagia, oropharyngeal phase: Secondary | ICD-10-CM | POA: Diagnosis not present

## 2018-11-04 DIAGNOSIS — S0083XD Contusion of other part of head, subsequent encounter: Secondary | ICD-10-CM | POA: Diagnosis not present

## 2018-11-04 DIAGNOSIS — M1991 Primary osteoarthritis, unspecified site: Secondary | ICD-10-CM | POA: Diagnosis not present

## 2018-11-04 DIAGNOSIS — K589 Irritable bowel syndrome without diarrhea: Secondary | ICD-10-CM | POA: Diagnosis not present

## 2018-11-04 DIAGNOSIS — F429 Obsessive-compulsive disorder, unspecified: Secondary | ICD-10-CM | POA: Diagnosis not present

## 2018-11-04 DIAGNOSIS — E538 Deficiency of other specified B group vitamins: Secondary | ICD-10-CM | POA: Diagnosis not present

## 2018-11-04 DIAGNOSIS — Z9181 History of falling: Secondary | ICD-10-CM | POA: Diagnosis not present

## 2018-11-04 DIAGNOSIS — N6320 Unspecified lump in the left breast, unspecified quadrant: Secondary | ICD-10-CM | POA: Diagnosis not present

## 2018-11-04 DIAGNOSIS — M810 Age-related osteoporosis without current pathological fracture: Secondary | ICD-10-CM | POA: Diagnosis not present

## 2018-11-04 DIAGNOSIS — F909 Attention-deficit hyperactivity disorder, unspecified type: Secondary | ICD-10-CM | POA: Diagnosis not present

## 2018-11-04 DIAGNOSIS — M797 Fibromyalgia: Secondary | ICD-10-CM | POA: Diagnosis not present

## 2018-11-04 DIAGNOSIS — M419 Scoliosis, unspecified: Secondary | ICD-10-CM | POA: Diagnosis not present

## 2018-11-08 DIAGNOSIS — F419 Anxiety disorder, unspecified: Secondary | ICD-10-CM | POA: Diagnosis not present

## 2018-11-08 DIAGNOSIS — F429 Obsessive-compulsive disorder, unspecified: Secondary | ICD-10-CM | POA: Diagnosis not present

## 2018-11-08 DIAGNOSIS — K589 Irritable bowel syndrome without diarrhea: Secondary | ICD-10-CM | POA: Diagnosis not present

## 2018-11-08 DIAGNOSIS — H04129 Dry eye syndrome of unspecified lacrimal gland: Secondary | ICD-10-CM | POA: Diagnosis not present

## 2018-11-08 DIAGNOSIS — G20 Parkinson's disease: Secondary | ICD-10-CM | POA: Diagnosis not present

## 2018-11-08 DIAGNOSIS — M797 Fibromyalgia: Secondary | ICD-10-CM | POA: Diagnosis not present

## 2018-11-08 DIAGNOSIS — S0083XD Contusion of other part of head, subsequent encounter: Secondary | ICD-10-CM | POA: Diagnosis not present

## 2018-11-08 DIAGNOSIS — M1991 Primary osteoarthritis, unspecified site: Secondary | ICD-10-CM | POA: Diagnosis not present

## 2018-11-08 DIAGNOSIS — M519 Unspecified thoracic, thoracolumbar and lumbosacral intervertebral disc disorder: Secondary | ICD-10-CM | POA: Diagnosis not present

## 2018-11-08 DIAGNOSIS — Z87440 Personal history of urinary (tract) infections: Secondary | ICD-10-CM | POA: Diagnosis not present

## 2018-11-08 DIAGNOSIS — E538 Deficiency of other specified B group vitamins: Secondary | ICD-10-CM | POA: Diagnosis not present

## 2018-11-08 DIAGNOSIS — N6320 Unspecified lump in the left breast, unspecified quadrant: Secondary | ICD-10-CM | POA: Diagnosis not present

## 2018-11-08 DIAGNOSIS — M419 Scoliosis, unspecified: Secondary | ICD-10-CM | POA: Diagnosis not present

## 2018-11-08 DIAGNOSIS — M810 Age-related osteoporosis without current pathological fracture: Secondary | ICD-10-CM | POA: Diagnosis not present

## 2018-11-08 DIAGNOSIS — Z9181 History of falling: Secondary | ICD-10-CM | POA: Diagnosis not present

## 2018-11-08 DIAGNOSIS — H5111 Convergence insufficiency: Secondary | ICD-10-CM | POA: Diagnosis not present

## 2018-11-08 DIAGNOSIS — F3341 Major depressive disorder, recurrent, in partial remission: Secondary | ICD-10-CM | POA: Diagnosis not present

## 2018-11-08 DIAGNOSIS — F909 Attention-deficit hyperactivity disorder, unspecified type: Secondary | ICD-10-CM | POA: Diagnosis not present

## 2018-11-08 DIAGNOSIS — R1312 Dysphagia, oropharyngeal phase: Secondary | ICD-10-CM | POA: Diagnosis not present

## 2018-11-08 DIAGNOSIS — M3500 Sicca syndrome, unspecified: Secondary | ICD-10-CM | POA: Diagnosis not present

## 2018-11-13 DIAGNOSIS — Z87440 Personal history of urinary (tract) infections: Secondary | ICD-10-CM | POA: Diagnosis not present

## 2018-11-13 DIAGNOSIS — H5111 Convergence insufficiency: Secondary | ICD-10-CM | POA: Diagnosis not present

## 2018-11-13 DIAGNOSIS — K589 Irritable bowel syndrome without diarrhea: Secondary | ICD-10-CM | POA: Diagnosis not present

## 2018-11-13 DIAGNOSIS — M3500 Sicca syndrome, unspecified: Secondary | ICD-10-CM | POA: Diagnosis not present

## 2018-11-13 DIAGNOSIS — M810 Age-related osteoporosis without current pathological fracture: Secondary | ICD-10-CM | POA: Diagnosis not present

## 2018-11-13 DIAGNOSIS — H04129 Dry eye syndrome of unspecified lacrimal gland: Secondary | ICD-10-CM | POA: Diagnosis not present

## 2018-11-13 DIAGNOSIS — M1991 Primary osteoarthritis, unspecified site: Secondary | ICD-10-CM | POA: Diagnosis not present

## 2018-11-13 DIAGNOSIS — M419 Scoliosis, unspecified: Secondary | ICD-10-CM | POA: Diagnosis not present

## 2018-11-13 DIAGNOSIS — Z9181 History of falling: Secondary | ICD-10-CM | POA: Diagnosis not present

## 2018-11-13 DIAGNOSIS — E538 Deficiency of other specified B group vitamins: Secondary | ICD-10-CM | POA: Diagnosis not present

## 2018-11-13 DIAGNOSIS — F419 Anxiety disorder, unspecified: Secondary | ICD-10-CM | POA: Diagnosis not present

## 2018-11-13 DIAGNOSIS — F3341 Major depressive disorder, recurrent, in partial remission: Secondary | ICD-10-CM | POA: Diagnosis not present

## 2018-11-13 DIAGNOSIS — F909 Attention-deficit hyperactivity disorder, unspecified type: Secondary | ICD-10-CM | POA: Diagnosis not present

## 2018-11-13 DIAGNOSIS — M519 Unspecified thoracic, thoracolumbar and lumbosacral intervertebral disc disorder: Secondary | ICD-10-CM | POA: Diagnosis not present

## 2018-11-13 DIAGNOSIS — G20 Parkinson's disease: Secondary | ICD-10-CM | POA: Diagnosis not present

## 2018-11-13 DIAGNOSIS — F429 Obsessive-compulsive disorder, unspecified: Secondary | ICD-10-CM | POA: Diagnosis not present

## 2018-11-13 DIAGNOSIS — N6320 Unspecified lump in the left breast, unspecified quadrant: Secondary | ICD-10-CM | POA: Diagnosis not present

## 2018-11-13 DIAGNOSIS — M797 Fibromyalgia: Secondary | ICD-10-CM | POA: Diagnosis not present

## 2018-11-13 DIAGNOSIS — S0083XD Contusion of other part of head, subsequent encounter: Secondary | ICD-10-CM | POA: Diagnosis not present

## 2018-11-13 DIAGNOSIS — R1312 Dysphagia, oropharyngeal phase: Secondary | ICD-10-CM | POA: Diagnosis not present

## 2018-11-15 DIAGNOSIS — S0083XD Contusion of other part of head, subsequent encounter: Secondary | ICD-10-CM | POA: Diagnosis not present

## 2018-11-15 DIAGNOSIS — F3341 Major depressive disorder, recurrent, in partial remission: Secondary | ICD-10-CM | POA: Diagnosis not present

## 2018-11-15 DIAGNOSIS — Z87440 Personal history of urinary (tract) infections: Secondary | ICD-10-CM | POA: Diagnosis not present

## 2018-11-15 DIAGNOSIS — R1312 Dysphagia, oropharyngeal phase: Secondary | ICD-10-CM | POA: Diagnosis not present

## 2018-11-15 DIAGNOSIS — H04129 Dry eye syndrome of unspecified lacrimal gland: Secondary | ICD-10-CM | POA: Diagnosis not present

## 2018-11-15 DIAGNOSIS — F909 Attention-deficit hyperactivity disorder, unspecified type: Secondary | ICD-10-CM | POA: Diagnosis not present

## 2018-11-15 DIAGNOSIS — G20 Parkinson's disease: Secondary | ICD-10-CM | POA: Diagnosis not present

## 2018-11-15 DIAGNOSIS — F419 Anxiety disorder, unspecified: Secondary | ICD-10-CM | POA: Diagnosis not present

## 2018-11-15 DIAGNOSIS — M797 Fibromyalgia: Secondary | ICD-10-CM | POA: Diagnosis not present

## 2018-11-15 DIAGNOSIS — K589 Irritable bowel syndrome without diarrhea: Secondary | ICD-10-CM | POA: Diagnosis not present

## 2018-11-15 DIAGNOSIS — M419 Scoliosis, unspecified: Secondary | ICD-10-CM | POA: Diagnosis not present

## 2018-11-15 DIAGNOSIS — Z9181 History of falling: Secondary | ICD-10-CM | POA: Diagnosis not present

## 2018-11-15 DIAGNOSIS — M1991 Primary osteoarthritis, unspecified site: Secondary | ICD-10-CM | POA: Diagnosis not present

## 2018-11-15 DIAGNOSIS — H5111 Convergence insufficiency: Secondary | ICD-10-CM | POA: Diagnosis not present

## 2018-11-15 DIAGNOSIS — F429 Obsessive-compulsive disorder, unspecified: Secondary | ICD-10-CM | POA: Diagnosis not present

## 2018-11-15 DIAGNOSIS — E538 Deficiency of other specified B group vitamins: Secondary | ICD-10-CM | POA: Diagnosis not present

## 2018-11-15 DIAGNOSIS — M810 Age-related osteoporosis without current pathological fracture: Secondary | ICD-10-CM | POA: Diagnosis not present

## 2018-11-15 DIAGNOSIS — N6320 Unspecified lump in the left breast, unspecified quadrant: Secondary | ICD-10-CM | POA: Diagnosis not present

## 2018-11-15 DIAGNOSIS — M519 Unspecified thoracic, thoracolumbar and lumbosacral intervertebral disc disorder: Secondary | ICD-10-CM | POA: Diagnosis not present

## 2018-11-15 DIAGNOSIS — M3500 Sicca syndrome, unspecified: Secondary | ICD-10-CM | POA: Diagnosis not present

## 2018-11-21 DIAGNOSIS — M519 Unspecified thoracic, thoracolumbar and lumbosacral intervertebral disc disorder: Secondary | ICD-10-CM | POA: Diagnosis not present

## 2018-11-21 DIAGNOSIS — H04129 Dry eye syndrome of unspecified lacrimal gland: Secondary | ICD-10-CM | POA: Diagnosis not present

## 2018-11-21 DIAGNOSIS — Z87440 Personal history of urinary (tract) infections: Secondary | ICD-10-CM | POA: Diagnosis not present

## 2018-11-21 DIAGNOSIS — M810 Age-related osteoporosis without current pathological fracture: Secondary | ICD-10-CM | POA: Diagnosis not present

## 2018-11-21 DIAGNOSIS — H5111 Convergence insufficiency: Secondary | ICD-10-CM | POA: Diagnosis not present

## 2018-11-21 DIAGNOSIS — S0083XD Contusion of other part of head, subsequent encounter: Secondary | ICD-10-CM | POA: Diagnosis not present

## 2018-11-21 DIAGNOSIS — F419 Anxiety disorder, unspecified: Secondary | ICD-10-CM | POA: Diagnosis not present

## 2018-11-21 DIAGNOSIS — K589 Irritable bowel syndrome without diarrhea: Secondary | ICD-10-CM | POA: Diagnosis not present

## 2018-11-21 DIAGNOSIS — M797 Fibromyalgia: Secondary | ICD-10-CM | POA: Diagnosis not present

## 2018-11-21 DIAGNOSIS — F909 Attention-deficit hyperactivity disorder, unspecified type: Secondary | ICD-10-CM | POA: Diagnosis not present

## 2018-11-21 DIAGNOSIS — M1991 Primary osteoarthritis, unspecified site: Secondary | ICD-10-CM | POA: Diagnosis not present

## 2018-11-21 DIAGNOSIS — M419 Scoliosis, unspecified: Secondary | ICD-10-CM | POA: Diagnosis not present

## 2018-11-21 DIAGNOSIS — M3500 Sicca syndrome, unspecified: Secondary | ICD-10-CM | POA: Diagnosis not present

## 2018-11-21 DIAGNOSIS — Z9181 History of falling: Secondary | ICD-10-CM | POA: Diagnosis not present

## 2018-11-21 DIAGNOSIS — R1312 Dysphagia, oropharyngeal phase: Secondary | ICD-10-CM | POA: Diagnosis not present

## 2018-11-21 DIAGNOSIS — N6320 Unspecified lump in the left breast, unspecified quadrant: Secondary | ICD-10-CM | POA: Diagnosis not present

## 2018-11-21 DIAGNOSIS — E538 Deficiency of other specified B group vitamins: Secondary | ICD-10-CM | POA: Diagnosis not present

## 2018-11-21 DIAGNOSIS — G20 Parkinson's disease: Secondary | ICD-10-CM | POA: Diagnosis not present

## 2018-11-21 DIAGNOSIS — F429 Obsessive-compulsive disorder, unspecified: Secondary | ICD-10-CM | POA: Diagnosis not present

## 2018-11-21 DIAGNOSIS — F3341 Major depressive disorder, recurrent, in partial remission: Secondary | ICD-10-CM | POA: Diagnosis not present

## 2018-11-22 DIAGNOSIS — S0083XD Contusion of other part of head, subsequent encounter: Secondary | ICD-10-CM | POA: Diagnosis not present

## 2018-11-22 DIAGNOSIS — M419 Scoliosis, unspecified: Secondary | ICD-10-CM | POA: Diagnosis not present

## 2018-11-22 DIAGNOSIS — M797 Fibromyalgia: Secondary | ICD-10-CM | POA: Diagnosis not present

## 2018-11-22 DIAGNOSIS — M810 Age-related osteoporosis without current pathological fracture: Secondary | ICD-10-CM | POA: Diagnosis not present

## 2018-11-22 DIAGNOSIS — M519 Unspecified thoracic, thoracolumbar and lumbosacral intervertebral disc disorder: Secondary | ICD-10-CM | POA: Diagnosis not present

## 2018-11-22 DIAGNOSIS — N6320 Unspecified lump in the left breast, unspecified quadrant: Secondary | ICD-10-CM | POA: Diagnosis not present

## 2018-11-22 DIAGNOSIS — M3500 Sicca syndrome, unspecified: Secondary | ICD-10-CM | POA: Diagnosis not present

## 2018-11-22 DIAGNOSIS — G20 Parkinson's disease: Secondary | ICD-10-CM | POA: Diagnosis not present

## 2018-12-14 ENCOUNTER — Ambulatory Visit: Payer: PPO | Admitting: Podiatry

## 2018-12-18 DIAGNOSIS — R399 Unspecified symptoms and signs involving the genitourinary system: Secondary | ICD-10-CM | POA: Diagnosis not present

## 2019-01-10 DIAGNOSIS — R399 Unspecified symptoms and signs involving the genitourinary system: Secondary | ICD-10-CM | POA: Diagnosis not present

## 2019-04-30 DIAGNOSIS — R399 Unspecified symptoms and signs involving the genitourinary system: Secondary | ICD-10-CM | POA: Diagnosis not present

## 2019-11-15 ENCOUNTER — Encounter: Payer: Self-pay | Admitting: Emergency Medicine

## 2019-11-15 ENCOUNTER — Other Ambulatory Visit: Payer: Self-pay

## 2019-11-15 ENCOUNTER — Emergency Department: Payer: PPO

## 2019-11-15 ENCOUNTER — Emergency Department
Admission: EM | Admit: 2019-11-15 | Discharge: 2019-11-15 | Disposition: A | Discharge status: Home / Self Care | Payer: PPO | Attending: Emergency Medicine | Admitting: Emergency Medicine

## 2019-11-15 DIAGNOSIS — Y999 Unspecified external cause status: Secondary | ICD-10-CM | POA: Insufficient documentation

## 2019-11-15 DIAGNOSIS — S022XXA Fracture of nasal bones, initial encounter for closed fracture: Secondary | ICD-10-CM

## 2019-11-15 DIAGNOSIS — R296 Repeated falls: Secondary | ICD-10-CM | POA: Insufficient documentation

## 2019-11-15 DIAGNOSIS — R5381 Other malaise: Secondary | ICD-10-CM | POA: Diagnosis not present

## 2019-11-15 DIAGNOSIS — Z8616 Personal history of COVID-19: Secondary | ICD-10-CM | POA: Diagnosis not present

## 2019-11-15 DIAGNOSIS — N3090 Cystitis, unspecified without hematuria: Secondary | ICD-10-CM | POA: Insufficient documentation

## 2019-11-15 DIAGNOSIS — Y929 Unspecified place or not applicable: Secondary | ICD-10-CM | POA: Insufficient documentation

## 2019-11-15 DIAGNOSIS — G20 Parkinson's disease: Secondary | ICD-10-CM | POA: Insufficient documentation

## 2019-11-15 DIAGNOSIS — Z79899 Other long term (current) drug therapy: Secondary | ICD-10-CM | POA: Diagnosis not present

## 2019-11-15 DIAGNOSIS — R279 Unspecified lack of coordination: Secondary | ICD-10-CM | POA: Diagnosis not present

## 2019-11-15 DIAGNOSIS — I959 Hypotension, unspecified: Secondary | ICD-10-CM | POA: Diagnosis not present

## 2019-11-15 DIAGNOSIS — I10 Essential (primary) hypertension: Secondary | ICD-10-CM | POA: Diagnosis not present

## 2019-11-15 DIAGNOSIS — W19XXXA Unspecified fall, initial encounter: Secondary | ICD-10-CM | POA: Diagnosis not present

## 2019-11-15 DIAGNOSIS — R531 Weakness: Secondary | ICD-10-CM | POA: Diagnosis present

## 2019-11-15 DIAGNOSIS — Z743 Need for continuous supervision: Secondary | ICD-10-CM | POA: Diagnosis not present

## 2019-11-15 DIAGNOSIS — S0990XA Unspecified injury of head, initial encounter: Secondary | ICD-10-CM | POA: Diagnosis not present

## 2019-11-15 DIAGNOSIS — Y9389 Activity, other specified: Secondary | ICD-10-CM | POA: Insufficient documentation

## 2019-11-15 DIAGNOSIS — S199XXA Unspecified injury of neck, initial encounter: Secondary | ICD-10-CM | POA: Diagnosis not present

## 2019-11-15 LAB — URINALYSIS, COMPLETE (UACMP) WITH MICROSCOPIC
Bilirubin Urine: NEGATIVE
Glucose, UA: NEGATIVE mg/dL
Hgb urine dipstick: NEGATIVE
Ketones, ur: 20 mg/dL — AB
Leukocytes,Ua: NEGATIVE
Nitrite: POSITIVE — AB
Protein, ur: NEGATIVE mg/dL
Specific Gravity, Urine: 1.034 — ABNORMAL HIGH (ref 1.005–1.030)
pH: 5 (ref 5.0–8.0)

## 2019-11-15 LAB — BASIC METABOLIC PANEL
Anion gap: 9 (ref 5–15)
BUN: 24 mg/dL — ABNORMAL HIGH (ref 8–23)
CO2: 28 mmol/L (ref 22–32)
Calcium: 9.1 mg/dL (ref 8.9–10.3)
Chloride: 101 mmol/L (ref 98–111)
Creatinine, Ser: 0.62 mg/dL (ref 0.44–1.00)
GFR calc Af Amer: >60 mL/min (ref >60)
GFR calc non Af Amer: >60 mL/min (ref >60)
Glucose, Bld: 103 mg/dL — ABNORMAL HIGH (ref 70–99)
Potassium: 3.9 mmol/L (ref 3.5–5.1)
Sodium: 138 mmol/L (ref 135–145)

## 2019-11-15 LAB — CBC
HCT: 39.4 % (ref 36.0–46.0)
Hemoglobin: 12.7 g/dL (ref 12.0–15.0)
MCH: 30 pg (ref 26.0–34.0)
MCHC: 32.2 g/dL (ref 30.0–36.0)
MCV: 93.1 fL (ref 80.0–100.0)
Platelets: 220 10*3/uL (ref 150–400)
RBC: 4.23 MIL/uL (ref 3.87–5.11)
RDW: 12.5 % (ref 11.5–15.5)
WBC: 6 10*3/uL (ref 4.0–10.5)
nRBC: 0 % (ref 0.0–0.2)

## 2019-11-15 MED ORDER — CIPROFLOXACIN HCL 250 MG PO TABS: 250.0000 mg | ORAL_TABLET | Freq: Two times a day (BID) | ORAL | 0 refills | Status: AC

## 2019-11-15 NOTE — Discharge Instructions (Signed)
Patient CT is concerning for nasal bone fracture.  These are usually nonoperative but he can follow-up with ENT.  Her urine looks concerning for UTI and I started her on an antibiotic.  She will need to have her culture followed up on to make sure it was sensitive.  Return to the ER for increasing weakness, fevers, chest pain, shortness of breath or any other concerns    IMPRESSION:  Acute comminuted and depressed bilateral nasal bone fractures with  overlying soft tissue swelling.

## 2019-11-15 NOTE — ED Notes (Signed)
[facility]  CALLED  FOR  TRANSPORT   TO  [place]  HOUSE 

## 2019-11-15 NOTE — ED Provider Notes (Signed)
[facility] Emergency Department Provider Note  ____________________________________________   First MD Initiated Contact with Patient 11/15/19 1535     (approximate)  I have reviewed the triage vital signs and the nursing notes.   HISTORY  Chief Complaint Weakness and Facial Pain    HPI Kaitlyn Church is a 79 y.o. female with anxiety, depression, Parkinson's who comes in for increasing weakness.  Patient states that she had coronavirus a few weeks ago when during that she had a fall.  She states that since then she has felt weak.  She never was seen and had any CT scans done.  She stated that she felt like she was dismissed.  She stated that she had another fall today.  The main reason she is here is because she want to make sure that she did not have any injuries from this fall.  She does have a little swelling underneath her left eye and some tenderness to her nose.  She states she did hit her face.  Tenderness is mild, constant, nothing makes it better, nothing makes it worse.  No active nosebleed at this time.  She denies any chest pain or shortness of breath or abdominal pain.          Past Medical History:  Diagnosis Date  . Abdominal pain, right upper quadrant   . Allergic rhinitis   . Anxiety   . Depression   . Esophageal motility disorder   . Fibromyalgia   . Gait disturbance   . GERD (gastroesophageal reflux disease)   . Grief reaction   . Hemorrhoids, internal, thrombosed   . Interstitial cystitis   . Irritable bowel syndrome   . Osteoporosis   . Parkinson disease ([place])   . Restless leg syndrome   . Shingles   . Sjogren's syndrome ([place])    ?  Marland Kitchen Trochanteric bursitis    bilateral  . Vaginitis     Patient Active Problem List   Diagnosis Date Noted  . Arthritis 06/01/2016  . B12 deficiency 06/01/2016  . Osteoporosis, post-menopausal 06/01/2016  . Parkinson disease ([place]) 06/01/2016  . Sjogren's syndrome ([place]) 06/01/2016  .  Chronic cystitis 06/01/2016  . Dysuria 04/16/2016  . Recurrent major depressive disorder, in partial remission ([place]) 01/07/2016  . Chronic constipation 09/24/2014  . Dysphagia 09/24/2014  . Raynaud's phenomenon without gangrene 09/24/2014  . [place] 04/20/2013  . Bilateral dry eyes 03/06/2012  . Blepharitis of both eyes 03/06/2012  . MGD (meibomian gland dysfunction) 03/06/2012  . DYSPHAGIA PHARYNGEAL PHASE 07/21/2009  . ESOPHAGEAL MOTILITY DISORDER 02/03/2009  . FIBROMYALGIA 02/03/2009  . TROCHANTERIC BURSITIS, BILATERAL 06/21/2008  . HAND PAIN 06/21/2008  . GAIT DISTURBANCE 06/21/2008  . ABDOMINAL, RIGHT UPPER, PAIN 06/21/2008  . GRIEF REACTION 05/30/2008  . VAGINITIS 05/30/2008  . OSTEOPOROSIS 05/30/2008  . HEMORRHOIDS, INTERNAL, THROMBOSED 03/22/2008  . CHANGE IN BOWELS 03/22/2008  . FATIGUE 03/10/2007  . RESTLESS LEG SYNDROME 02/19/2007  . Irritable bowel syndrome 02/19/2007  . ANXIETY 02/16/2007  . DEPRESSION 02/16/2007  . ALLERGIC RHINITIS 02/16/2007  . GERD 02/16/2007  . INTERSTITIAL CYSTITIS 02/16/2007  . [place] SYNDROME 02/16/2007  . FIBROSITIS 02/16/2007    Past Surgical History:  Procedure Laterality Date  . ABDOMINAL HYSTERECTOMY  10/2002  . APPENDECTOMY  11/1969  . CYSTOCELE REPAIR  1980  . ESOPHAGOGASTRODUODENOSCOPY  01/2001   nl  . ESOPHAGOGASTRODUODENOSCOPY  09/2006   Normal Carlean Purl)  . EXPLORATORY LAPAROTOMY  09/1969  . HERNIA REPAIR  1980  . NASAL  SEPTUM SURGERY  11/1976  . POLYPECTOMY  03/1972   Bladder  . [place]  . TONSILLECTOMY  1948    Prior to Admission medications   Medication Sig Start Date End Date Taking? Authorizing Provider  acetaminophen (TYLENOL) 500 MG tablet Take 500 mg by mouth every 4 (four) hours as needed.    [provider]  ALPRAZolam Duanne Moron) 0.25 MG tablet Take by mouth 4 (four) times daily.     [provider]  alum & mag hydroxide-simeth (MAALOX/MYLANTA) 200-200-20 MG/5ML  suspension Take 30 mLs by mouth every 6 (six) hours as needed for indigestion or heartburn.    [provider]  antiseptic oral rinse (BIOTENE) LIQD 15 mLs by Mouth Rinse route as needed for dry mouth.    [provider]  bisacodyl (DULCOLAX) 10 MG suppository Place 10 mg rectally daily as needed for moderate constipation.    [provider]  carbidopa-levodopa (SINEMET CR) 50-200 MG tablet Take 1 tablet by mouth daily.    [provider]  Carbidopa-Levodopa ER (SINEMET CR) 25-100 MG tablet controlled release Take 1.5 tablets by mouth 3 (three) times daily.    [provider]  desonide (DESOWEN) 0.05 % cream Apply 1 application topically 2 (two) times daily as needed.     [provider]  dimenhyDRINATE (DRAMAMINE) 50 MG tablet Take 50 mg by mouth every 4 (four) hours as needed.    [provider]  estrogens, conjugated, (PREMARIN) 0.625 MG tablet Take 0.625 mg by mouth daily. Take daily for 21 days then do not take for 7 days.     [provider]  FLUoxetine (PROZAC) 10 MG tablet Take 5 mg by mouth daily.      [provider]  Glycerin (OASIS MOISTURIZING MOUTH SPRAY) 35 % LIQD Use as directed 4 sprays in the mouth or throat at bedtime.    [provider]  Glycerin, Laxative, (FLEET LIQUID GLYCERIN SUPP RE) Place 1 suppository rectally daily.    [provider]  guaifenesin (ROBAFEN) 100 MG/5ML syrup Take 200 mg by mouth 4 (four) times daily as needed for cough.    [provider]  Homeopathic Products (ARNICARE ARNICA EX) Apply 1 application topically daily as needed.    [provider]  ibuprofen (ADVIL,MOTRIN) 200 MG tablet Take 200 mg by mouth 3 (three) times daily.     [provider]  Lifitegrast Shirley Friar) 5 % SOLN Apply 1 drop to eye 2 (two) times daily.    [provider]  loperamide (IMODIUM) 2 MG capsule Take 2 mg by mouth as needed for diarrhea or loose  stools.    [provider]  loratadine (CLARITIN) 10 MG tablet Take 5 mg by mouth daily as needed.     [provider]  magnesium hydroxide (MILK OF MAGNESIA) 400 MG/5ML suspension Take 30 mLs by mouth at bedtime as needed for mild constipation.    [provider]  magnesium oxide (MAG-OX) 400 MG tablet Take 400 mg by mouth at bedtime as needed.    [provider]  Meth-Hyo-M Bl-Na Phos-Ph Sal (URIBEL) 118 MG CAPS Take by mouth.    [provider]  Neomycin-Bacitracin-Polymyxin (TRIPLE ANTIBIOTIC) [phone] OINT Apply topically as directed.    [provider]  shark liver oil-cocoa butter (PREPARATION H) 0.25-88.44 % suppository Place 1 suppository rectally as needed for hemorrhoids.    [provider]  Wheat Dextrin (BENEFIBER) POWD Take 10 mLs by mouth 2 (two)  times daily as needed.    [provider]    Allergies Lidocaine, Other, Propoxyphene, Ascorbic acid, Ascorbic acid, Azithromycin, Basil oil, Calcium, Celecoxib, Cephalexin, Dexamethasone, Doxycycline, Itraconazole, Meperidine hcl, Mirabegron, Morphine, Morphine sulfate, Nitrofurantoin, [place] oil, Pantoprazole sodium, Penicillins, Propoxyphene hcl, Sulfa antibiotics, Sulfamethoxazole-trimethoprim, and Thimerosal  Family History  Problem Relation Age of Onset  . Breast cancer Mother   . Alcohol abuse Father   . Pneumonia Father   . Bipolar disorder Son   . Coronary artery disease Neg Hx   . Diabetes Neg Hx   . Hypertension Neg Hx   . Colon cancer Neg Hx   . Prostate cancer Neg Hx   . Kidney cancer Neg Hx     Social History Social History   Tobacco Use  . Smoking status: Former Research scientist (life sciences)  . Smokeless tobacco: Never Used  Substance Use Topics  . Alcohol use: No  . Drug use: No      Review of Systems Constitutional: No fever/chills, fall Eyes: No visual changes. ENT: No sore throat.,  Face trauma Cardiovascular: Denies chest pain. Respiratory:  Denies shortness of breath. Gastrointestinal: No abdominal pain.  No nausea, no vomiting.  No diarrhea.  No constipation. Genitourinary: Positive for dysuria Musculoskeletal: Negative for back pain. Skin: Negative for rash. Neurological: Negative for headaches, focal weakness or numbness. All other ROS negative ____________________________________________   PHYSICAL EXAM:  VITAL SIGNS: ED Triage Vitals  Enc Vitals Group     BP 11/15/19 1146 (!) 141/69     Pulse Rate 11/15/19 1146 74     Resp 11/15/19 1146 20     Temp 11/15/19 1146 97.9 F (36.6 C)     Temp Source 11/15/19 1146 Oral     SpO2 11/15/19 1146 100 %     Weight 11/15/19 1149 98 lb (44.5 kg)     Height 11/15/19 1149 5\' 5"  (1.651 m)     Head Circumference --      Peak Flow --      Pain Score 11/15/19 1148 5     Pain Loc --      Pain Edu? --      Excl. in [place]? --     Constitutional: Alert and oriented. Well appearing and in no acute distress. Eyes: Conjunctivae are normal. EOMI. swelling below the left eye Head: Atraumatic. Nose: No congestion/rhinnorhea.  Tenderness on the nose no septal hematoma Mouth/Throat: Mucous membranes are moist.   Neck: No stridor. Trachea Midline. FROM Cardiovascular: Normal rate, regular rhythm. Grossly normal heart sounds.  Good peripheral circulation.  No chest wall tenderness Respiratory: Normal respiratory effort.  No retractions. Lungs CTAB. Gastrointestinal: Soft and nontender. No distention. No abdominal bruits.  Musculoskeletal: No lower extremity tenderness nor edema.  No joint effusions.  Able to lift both legs up off the ground. Neurologic:  Normal speech and language. No gross focal neurologic deficits are appreciated.  Equal strength in her arms and her legs. Skin:  Skin is warm, dry and intact. No rash noted. Psychiatric: Mood and affect are normal.Patient does speak quietly but it sounds about is at baseline for patient GU: Deferred    ____________________________________________   LABS (all labs ordered are listed, but only abnormal results are displayed)  Labs Reviewed  BASIC METABOLIC PANEL - Abnormal; Notable for the following components:      Result Value   Glucose, Bld 103 (*)    BUN 24 (*)    All other components within normal limits  URINALYSIS, COMPLETE (UACMP) WITH  MICROSCOPIC - Abnormal; Notable for the following components:   Color, Urine YELLOW (*)    APPearance HAZY (*)    Specific Gravity, Urine 1.034 (*)    Ketones, ur 20 (*)    Nitrite POSITIVE (*)    Bacteria, UA MANY (*)    All other components within normal limits  URINE CULTURE  CBC   ____________________________________________   ED ECG REPORT I, Kaitlyn Church, the attending physician, personally viewed and interpreted this ECG.  EKG is normal sinus rate of 80, no ST elevations, T wave inversion in lead III and V3, normal intervals.  Does appear new from prior but no recent EKG. ____________________________________________  RADIOLOGY   Official radiology report(s): CT Head Wo Contrast  Result Date: 11/15/2019 CLINICAL DATA:  Head trauma fell from wheelchair EXAM: CT HEAD WITHOUT CONTRAST TECHNIQUE: Contiguous axial images were obtained from the base of the skull through the vertex without intravenous contrast. COMPARISON:  CT brain 09/13/2018 FINDINGS: Brain: No acute territorial infarction, hemorrhage or intracranial mass. Minimal hypodensity in the white matter consistent with chronic small vessel ischemic change. Mild cortical atrophy. Mild cerebellar atrophy. Vascular: No hyperdense vessels. Scattered carotid vascular calcification Skull: Normal. Negative for fracture or focal lesion. Sinuses/Orbits: Mucosal thickening in the ethmoid sinuses. Nasal bone deformity Other: Small forehead hematoma IMPRESSION: 1. No CT evidence for acute intracranial abnormality. 2. Atrophy and mild chronic small vessel ischemic change of the white  matter Electronically Signed   By: Donavan Foil M.D.   On: 11/15/2019 16:22   CT Cervical Spine Wo Contrast  Result Date: 11/15/2019 CLINICAL DATA:  [place] out of wheelchair EXAM: CT CERVICAL SPINE WITHOUT CONTRAST TECHNIQUE: Multidetector CT imaging of the cervical spine was performed without intravenous contrast. Multiplanar CT image reconstructions were also generated. COMPARISON:  CT 11/01/2015, 07/07/2017 FINDINGS: Alignment: Straightening of the cervical spine. No subluxation. Facet alignment within normal limits Skull base and vertebrae: No acute fracture. No primary bone lesion or focal pathologic process. Soft tissues and spinal canal: No prevertebral fluid or swelling. No visible canal hematoma. Disc levels: Moderate to marked diffuse degenerative changes C3 through C7. Facet degenerative changes at multiple levels. Upper chest: Negative. Other: None IMPRESSION: Straightening of the cervical spine with degenerative changes. No acute osseous abnormality Electronically Signed   By: Donavan Foil M.D.   On: 11/15/2019 16:35   CT Maxillofacial Wo Contrast  Result Date: 11/15/2019 CLINICAL DATA:  Facial trauma EXAM: CT MAXILLOFACIAL WITHOUT CONTRAST TECHNIQUE: Multidetector CT imaging of the maxillofacial structures was performed. Multiplanar CT image reconstructions were also generated. COMPARISON:  04/05/2017 FINDINGS: Osseous: Mandibular heads are normally position. No mandibular fracture. Mastoid air cells are clear. Pterygoid plates and zygomatic arches are intact. Acute comminuted and depressed bilateral nasal bone fractures. Orbits: Negative. No traumatic or inflammatory finding. Sinuses: Mild mucosal thickening in the ethmoid sinuses Soft tissues: Soft tissue swelling over the nasal bridge and forehead Limited intracranial: No significant or unexpected finding. IMPRESSION: Acute comminuted and depressed bilateral nasal bone fractures with overlying soft tissue swelling. Electronically Signed    By: Donavan Foil M.D.   On: 11/15/2019 16:28    ____________________________________________   PROCEDURES  Procedure(s) performed (including Critical Care):  Procedures   ____________________________________________   INITIAL IMPRESSION / ASSESSMENT AND PLAN / ED COURSE  SKYYLAR FRATE was evaluated in Emergency Department on 11/15/2019 for the symptoms described in the history of present illness. She was evaluated in the context of the global COVID-19 pandemic, which necessitated consideration that  the patient might be at risk for infection with the SARS-CoV-2 virus that causes COVID-19. Institutional protocols and algorithms that pertain to the evaluation of patients at risk for COVID-19 are in a state of rapid change based on information released by regulatory bodies including the CDC and federal and state organizations. These policies and algorithms were followed during the patient's care in the ED.    Patient is a 79 year old who comes in with some weakness.  Will get labs to evaluate for anemia, electrolyte abnormalities, AKI.  Given patient does report a fall and does have some swelling underneath her left eye, will get a CT head to evaluate for intracranial hemorrhage, CT cervical evaluate for cervical fracture and CT face to evaluate for orbital fracture/nasal fracture.  No other tenderness to suggest any other injuries. Fall sound mechanical secondary to her Parkinson's.   Patient is wheelchair-bound at baseline is secondary to end-stage Parkinson's.  She denies any new chest pain or shortness of breath to suggest ACS or PE and EKG was obtained that did not have any ST elevation.  Will get UA to evaluate for UTI.  Patient CT imaging was consistent with nasal bone fracture.  We will give her ENT follow-up as needed.  Patient's labs are otherwise reassuring.  No significant white count elevation to suggest infection, no anemia, no AKI.  Patient's urine does look concerning for UTI  with many bacteria and positive nitrites.  Patient has many allergies.  Will start on cipro due to patient's multiple drug allergies and culture was sent.  I suspect that this could be contributed patient's weakness given she does endorse some dysuria as well.   Patient looks very well on exam with normal vital signs other than a little bit of hypotension.  At this point I think it would be safe for patient to follow-up outpatient.  Comfortable discharge home is tolerating p.o.  I discussed the provisional nature of ED diagnosis, the treatment so far, the ongoing plan of care, follow up appointments and return precautions with the patient and any family or support people present. They expressed understanding and agreed with the plan, discharged home.   ____________________________________________   FINAL CLINICAL IMPRESSION(S) / ED DIAGNOSES   Final diagnoses:  Cystitis  Fall, initial encounter  Closed fracture of nasal bone, initial encounter      MEDICATIONS GIVEN DURING THIS VISIT:  Medications - No data to display   ED Discharge Orders         Ordered    ciprofloxacin (CIPRO) 250 MG tablet  2 times daily     11/15/19 1655           Note:  This document was prepared using Dragon voice recognition software and may include unintentional dictation errors.   Kaitlyn Leonardo, MD 11/15/19 [phone]

## 2019-11-15 NOTE — ED Triage Notes (Signed)
See first nurse note. Patient reports weakness, as stated by EMS, but states she is really here to have her nose x-rayed where a door hit her nose when she was in her wheelchair. +Covid a few weeks ago.

## 2019-11-15 NOTE — ED Triage Notes (Signed)
In via EMS from [facility]. EMS reports pt with COVID several weeks ago and has been dealing with increasing weakness since then. EMS reports per facility pt went to the BR and fell asleep against the vanity while sitting in the WC. EMS reports this has happened twice. 133/48, 84HR, 98%RA. EMS reports when pt communicates she whispers and that is normal for her.

## 2019-11-17 LAB — URINE CULTURE: Culture: >=100000 — AB

## 2019-11-26 DIAGNOSIS — N3010 Interstitial cystitis (chronic) without hematuria: Secondary | ICD-10-CM | POA: Diagnosis not present

## 2019-11-26 DIAGNOSIS — M810 Age-related osteoporosis without current pathological fracture: Secondary | ICD-10-CM | POA: Diagnosis not present

## 2019-11-26 DIAGNOSIS — F3341 Major depressive disorder, recurrent, in partial remission: Secondary | ICD-10-CM | POA: Diagnosis not present

## 2019-11-26 DIAGNOSIS — H02889 Meibomian gland dysfunction of unspecified eye, unspecified eyelid: Secondary | ICD-10-CM | POA: Diagnosis not present

## 2019-11-26 DIAGNOSIS — E538 Deficiency of other specified B group vitamins: Secondary | ICD-10-CM | POA: Diagnosis not present

## 2019-11-26 DIAGNOSIS — R1310 Dysphagia, unspecified: Secondary | ICD-10-CM | POA: Diagnosis not present

## 2019-11-26 DIAGNOSIS — G20 Parkinson's disease: Secondary | ICD-10-CM | POA: Diagnosis not present

## 2019-11-26 DIAGNOSIS — R634 Abnormal weight loss: Secondary | ICD-10-CM | POA: Diagnosis not present

## 2019-11-26 DIAGNOSIS — M3509 Sicca syndrome with other organ involvement: Secondary | ICD-10-CM | POA: Diagnosis not present

## 2019-12-04 ENCOUNTER — Other Ambulatory Visit: Payer: Self-pay | Admitting: Internal Medicine

## 2019-12-04 DIAGNOSIS — R1310 Dysphagia, unspecified: Secondary | ICD-10-CM

## 2019-12-19 ENCOUNTER — Emergency Department: Payer: PPO

## 2019-12-19 ENCOUNTER — Other Ambulatory Visit: Payer: Self-pay

## 2019-12-19 ENCOUNTER — Inpatient Hospital Stay
Admission: EM | Admit: 2019-12-19 | Discharge: 2019-12-24 | DRG: 522 | Disposition: A | Discharge status: Skilled Nursing Facility | Payer: PPO | Source: Skilled Nursing Facility | Attending: Internal Medicine | Admitting: Internal Medicine

## 2019-12-19 DIAGNOSIS — Z03818 Encounter for observation for suspected exposure to other biological agents ruled out: Secondary | ICD-10-CM | POA: Diagnosis not present

## 2019-12-19 DIAGNOSIS — Z7401 Bed confinement status: Secondary | ICD-10-CM | POA: Diagnosis not present

## 2019-12-19 DIAGNOSIS — Z91018 Allergy to other foods: Secondary | ICD-10-CM | POA: Diagnosis not present

## 2019-12-19 DIAGNOSIS — Z885 Allergy status to narcotic agent status: Secondary | ICD-10-CM | POA: Diagnosis not present

## 2019-12-19 DIAGNOSIS — F329 Major depressive disorder, single episode, unspecified: Secondary | ICD-10-CM | POA: Diagnosis present

## 2019-12-19 DIAGNOSIS — Z96649 Presence of unspecified artificial hip joint: Secondary | ICD-10-CM

## 2019-12-19 DIAGNOSIS — M810 Age-related osteoporosis without current pathological fracture: Secondary | ICD-10-CM | POA: Diagnosis not present

## 2019-12-19 DIAGNOSIS — R296 Repeated falls: Secondary | ICD-10-CM | POA: Diagnosis present

## 2019-12-19 DIAGNOSIS — R1313 Dysphagia, pharyngeal phase: Secondary | ICD-10-CM | POA: Diagnosis not present

## 2019-12-19 DIAGNOSIS — R682 Dry mouth, unspecified: Secondary | ICD-10-CM | POA: Diagnosis not present

## 2019-12-19 DIAGNOSIS — J309 Allergic rhinitis, unspecified: Secondary | ICD-10-CM | POA: Diagnosis present

## 2019-12-19 DIAGNOSIS — S72041A Displaced fracture of base of neck of right femur, initial encounter for closed fracture: Secondary | ICD-10-CM | POA: Diagnosis not present

## 2019-12-19 DIAGNOSIS — Z880 Allergy status to penicillin: Secondary | ICD-10-CM | POA: Diagnosis not present

## 2019-12-19 DIAGNOSIS — G2581 Restless legs syndrome: Secondary | ICD-10-CM | POA: Diagnosis present

## 2019-12-19 DIAGNOSIS — W19XXXA Unspecified fall, initial encounter: Secondary | ICD-10-CM

## 2019-12-19 DIAGNOSIS — F419 Anxiety disorder, unspecified: Secondary | ICD-10-CM | POA: Diagnosis not present

## 2019-12-19 DIAGNOSIS — S73006A Unspecified dislocation of unspecified hip, initial encounter: Secondary | ICD-10-CM

## 2019-12-19 DIAGNOSIS — R2689 Other abnormalities of gait and mobility: Secondary | ICD-10-CM | POA: Diagnosis not present

## 2019-12-19 DIAGNOSIS — R488 Other symbolic dysfunctions: Secondary | ICD-10-CM | POA: Diagnosis not present

## 2019-12-19 DIAGNOSIS — S72011D Unspecified intracapsular fracture of right femur, subsequent encounter for closed fracture with routine healing: Secondary | ICD-10-CM | POA: Diagnosis not present

## 2019-12-19 DIAGNOSIS — K580 Irritable bowel syndrome with diarrhea: Secondary | ICD-10-CM | POA: Diagnosis present

## 2019-12-19 DIAGNOSIS — Z66 Do not resuscitate: Secondary | ICD-10-CM | POA: Diagnosis present

## 2019-12-19 DIAGNOSIS — S72009A Fracture of unspecified part of neck of unspecified femur, initial encounter for closed fracture: Secondary | ICD-10-CM | POA: Diagnosis present

## 2019-12-19 DIAGNOSIS — Z882 Allergy status to sulfonamides status: Secondary | ICD-10-CM

## 2019-12-19 DIAGNOSIS — Y92099 Unspecified place in other non-institutional residence as the place of occurrence of the external cause: Secondary | ICD-10-CM

## 2019-12-19 DIAGNOSIS — Z87891 Personal history of nicotine dependence: Secondary | ICD-10-CM | POA: Diagnosis not present

## 2019-12-19 DIAGNOSIS — Z79899 Other long term (current) drug therapy: Secondary | ICD-10-CM | POA: Diagnosis not present

## 2019-12-19 DIAGNOSIS — K224 Dyskinesia of esophagus: Secondary | ICD-10-CM | POA: Diagnosis not present

## 2019-12-19 DIAGNOSIS — W19XXXD Unspecified fall, subsequent encounter: Secondary | ICD-10-CM | POA: Diagnosis not present

## 2019-12-19 DIAGNOSIS — M797 Fibromyalgia: Secondary | ICD-10-CM | POA: Diagnosis present

## 2019-12-19 DIAGNOSIS — W050XXA Fall from non-moving wheelchair, initial encounter: Secondary | ICD-10-CM | POA: Diagnosis present

## 2019-12-19 DIAGNOSIS — K589 Irritable bowel syndrome without diarrhea: Secondary | ICD-10-CM | POA: Diagnosis not present

## 2019-12-19 DIAGNOSIS — S72001A Fracture of unspecified part of neck of right femur, initial encounter for closed fracture: Principal | ICD-10-CM

## 2019-12-19 DIAGNOSIS — K219 Gastro-esophageal reflux disease without esophagitis: Secondary | ICD-10-CM | POA: Diagnosis not present

## 2019-12-19 DIAGNOSIS — M25551 Pain in right hip: Secondary | ICD-10-CM | POA: Diagnosis not present

## 2019-12-19 DIAGNOSIS — M3500 Sicca syndrome, unspecified: Secondary | ICD-10-CM | POA: Diagnosis not present

## 2019-12-19 DIAGNOSIS — Z888 Allergy status to other drugs, medicaments and biological substances status: Secondary | ICD-10-CM | POA: Diagnosis not present

## 2019-12-19 DIAGNOSIS — Z993 Dependence on wheelchair: Secondary | ICD-10-CM

## 2019-12-19 DIAGNOSIS — G20 Parkinson's disease: Secondary | ICD-10-CM | POA: Diagnosis not present

## 2019-12-19 DIAGNOSIS — S0990XA Unspecified injury of head, initial encounter: Secondary | ICD-10-CM | POA: Diagnosis not present

## 2019-12-19 DIAGNOSIS — R52 Pain, unspecified: Secondary | ICD-10-CM | POA: Diagnosis not present

## 2019-12-19 DIAGNOSIS — M6281 Muscle weakness (generalized): Secondary | ICD-10-CM | POA: Diagnosis not present

## 2019-12-19 DIAGNOSIS — I959 Hypotension, unspecified: Secondary | ICD-10-CM | POA: Diagnosis not present

## 2019-12-19 DIAGNOSIS — S199XXA Unspecified injury of neck, initial encounter: Secondary | ICD-10-CM | POA: Diagnosis not present

## 2019-12-19 DIAGNOSIS — Z96641 Presence of right artificial hip joint: Secondary | ICD-10-CM | POA: Diagnosis not present

## 2019-12-19 DIAGNOSIS — Z20822 Contact with and (suspected) exposure to covid-19: Secondary | ICD-10-CM | POA: Diagnosis present

## 2019-12-19 DIAGNOSIS — S72001D Fracture of unspecified part of neck of right femur, subsequent encounter for closed fracture with routine healing: Secondary | ICD-10-CM | POA: Diagnosis not present

## 2019-12-19 DIAGNOSIS — F418 Other specified anxiety disorders: Secondary | ICD-10-CM | POA: Diagnosis not present

## 2019-12-19 DIAGNOSIS — Z471 Aftercare following joint replacement surgery: Secondary | ICD-10-CM | POA: Diagnosis not present

## 2019-12-19 DIAGNOSIS — S72051A Unspecified fracture of head of right femur, initial encounter for closed fracture: Secondary | ICD-10-CM | POA: Diagnosis not present

## 2019-12-19 DIAGNOSIS — Z86010 Personal history of colonic polyps: Secondary | ICD-10-CM

## 2019-12-19 DIAGNOSIS — M2550 Pain in unspecified joint: Secondary | ICD-10-CM | POA: Diagnosis not present

## 2019-12-19 DIAGNOSIS — R457 State of emotional shock and stress, unspecified: Secondary | ICD-10-CM | POA: Diagnosis not present

## 2019-12-19 DIAGNOSIS — R531 Weakness: Secondary | ICD-10-CM | POA: Diagnosis not present

## 2019-12-19 DIAGNOSIS — S72011A Unspecified intracapsular fracture of right femur, initial encounter for closed fracture: Secondary | ICD-10-CM | POA: Diagnosis not present

## 2019-12-19 DIAGNOSIS — Z419 Encounter for procedure for purposes other than remedying health state, unspecified: Secondary | ICD-10-CM

## 2019-12-19 LAB — CBC WITH DIFFERENTIAL/PLATELET
Abs Immature Granulocytes: 0.04 10*3/uL (ref 0.00–0.07)
Basophils Absolute: 0 10*3/uL (ref 0.0–0.1)
Basophils Relative: 0 %
Eosinophils Absolute: 0 10*3/uL (ref 0.0–0.5)
Eosinophils Relative: 0 %
HCT: 38.4 % (ref 36.0–46.0)
Hemoglobin: 12.2 g/dL (ref 12.0–15.0)
Immature Granulocytes: 0 %
Lymphocytes Relative: 8 %
Lymphs Abs: 0.7 10*3/uL (ref 0.7–4.0)
MCH: 29.6 pg (ref 26.0–34.0)
MCHC: 31.8 g/dL (ref 30.0–36.0)
MCV: 93.2 fL (ref 80.0–100.0)
Monocytes Absolute: 0.5 10*3/uL (ref 0.1–1.0)
Monocytes Relative: 5 %
Neutro Abs: 8.3 10*3/uL — ABNORMAL HIGH (ref 1.7–7.7)
Neutrophils Relative %: 87 %
Platelets: 230 10*3/uL (ref 150–400)
RBC: 4.12 MIL/uL (ref 3.87–5.11)
RDW: 12.8 % (ref 11.5–15.5)
WBC: 9.6 10*3/uL (ref 4.0–10.5)
nRBC: 0 % (ref 0.0–0.2)

## 2019-12-19 LAB — PROTIME-INR
INR: 0.9 (ref 0.8–1.2)
Prothrombin Time: 12.3 seconds (ref 11.4–15.2)

## 2019-12-19 LAB — URINALYSIS, COMPLETE (UACMP) WITH MICROSCOPIC
Bilirubin Urine: NEGATIVE
Glucose, UA: NEGATIVE mg/dL
Hgb urine dipstick: NEGATIVE
Ketones, ur: 5 mg/dL — AB
Leukocytes,Ua: NEGATIVE
Nitrite: NEGATIVE
Protein, ur: NEGATIVE mg/dL
Specific Gravity, Urine: 1.016 (ref 1.005–1.030)
pH: 6 (ref 5.0–8.0)

## 2019-12-19 LAB — BASIC METABOLIC PANEL
Anion gap: 8 (ref 5–15)
BUN: 19 mg/dL (ref 8–23)
CO2: 26 mmol/L (ref 22–32)
Calcium: 8.7 mg/dL — ABNORMAL LOW (ref 8.9–10.3)
Chloride: 104 mmol/L (ref 98–111)
Creatinine, Ser: 0.6 mg/dL (ref 0.44–1.00)
GFR calc Af Amer: >60 mL/min (ref >60)
GFR calc non Af Amer: >60 mL/min (ref >60)
Glucose, Bld: 104 mg/dL — ABNORMAL HIGH (ref 70–99)
Potassium: 3.9 mmol/L (ref 3.5–5.1)
Sodium: 138 mmol/L (ref 135–145)

## 2019-12-19 LAB — RESPIRATORY PANEL BY RT PCR (FLU A&B, COVID)
Influenza A by PCR: NEGATIVE
Influenza B by PCR: NEGATIVE
SARS Coronavirus 2 by RT PCR: NEGATIVE

## 2019-12-19 MED ORDER — IBUPROFEN 600 MG PO TABS
600.0000 mg | ORAL_TABLET | Freq: Once | ORAL | Status: AC
  Administered 2019-12-19: 12:00:00 600 mg via ORAL
  Filled 2019-12-19: qty 1

## 2019-12-19 MED ORDER — TRAMADOL HCL 50 MG PO TABS
50.0000 mg | ORAL_TABLET | Freq: Four times a day (QID) | ORAL | Status: DC | PRN
  Administered 2019-12-19 – 2019-12-20 (×2): 50 mg via ORAL
  Filled 2019-12-19 (×2): qty 1

## 2019-12-19 MED ORDER — MAGNESIUM HYDROXIDE 400 MG/5ML PO SUSP
30.0000 mL | Freq: Every evening | ORAL | Status: DC | PRN
  Administered 2019-12-21: 13:00:00 30 mL via ORAL
  Filled 2019-12-19 (×2): qty 30

## 2019-12-19 MED ORDER — TRIPLE ANTIBIOTIC 3.5-400-5000 EX OINT
1.0000 "application " | TOPICAL_OINTMENT | Freq: Every day | CUTANEOUS | Status: DC | PRN
  Filled 2019-12-19: qty 1

## 2019-12-19 MED ORDER — CARBIDOPA-LEVODOPA 25-100 MG PO TABS
1.5000 | ORAL_TABLET | Freq: Every day | ORAL | Status: DC
  Administered 2019-12-19 – 2019-12-24 (×21): 1.5 via ORAL
  Filled 2019-12-19 (×29): qty 1.5

## 2019-12-19 MED ORDER — SODIUM CHLORIDE 0.9 % IV SOLN: INTRAVENOUS | Status: DC

## 2019-12-19 MED ORDER — MORPHINE SULFATE (PF) 2 MG/ML IV SOLN
0.5000 mg | INTRAVENOUS | Status: DC | PRN
  Administered 2019-12-20 (×2): 0.5 mg via INTRAVENOUS
  Filled 2019-12-19 (×2): qty 1

## 2019-12-19 MED ORDER — URIBEL 118 MG PO CAPS: 118.0000 mg | ORAL_CAPSULE | Freq: Every day | ORAL | Status: DC

## 2019-12-19 MED ORDER — METOCLOPRAMIDE HCL 5 MG/ML IJ SOLN: 5.0000 mg | Freq: Four times a day (QID) | INTRAMUSCULAR | Status: DC | PRN

## 2019-12-19 MED ORDER — LORATADINE 10 MG PO TABS
5.0000 mg | ORAL_TABLET | Freq: Every day | ORAL | Status: DC | PRN
  Filled 2019-12-19: qty 0.5

## 2019-12-19 MED ORDER — FLUOXETINE HCL 10 MG PO CAPS
10.0000 mg | ORAL_CAPSULE | Freq: Every day | ORAL | Status: DC
  Filled 2019-12-19: qty 1

## 2019-12-19 MED ORDER — URIBEL 118 MG PO CAPS
118.0000 mg | ORAL_CAPSULE | Freq: Every day | ORAL | Status: DC
  Administered 2019-12-19 – 2019-12-23 (×5): 118 mg via ORAL
  Filled 2019-12-19 (×7): qty 1

## 2019-12-19 MED ORDER — ACETAMINOPHEN 500 MG PO TABS
1000.0000 mg | ORAL_TABLET | Freq: Once | ORAL | Status: AC
  Administered 2019-12-19: 13:00:00 1000 mg via ORAL
  Filled 2019-12-19: qty 2

## 2019-12-19 MED ORDER — ESTROGENS CONJUGATED 0.625 MG PO TABS
0.6250 mg | ORAL_TABLET | Freq: Every day | ORAL | Status: DC
  Administered 2019-12-21 – 2019-12-24 (×4): 0.625 mg via ORAL
  Filled 2019-12-19 (×5): qty 1

## 2019-12-19 MED ORDER — FENTANYL CITRATE (PF) 100 MCG/2ML IJ SOLN: 50.0000 ug | Freq: Once | INTRAMUSCULAR | Status: DC

## 2019-12-19 MED ORDER — HYDROCODONE-ACETAMINOPHEN 5-325 MG PO TABS
1.0000 | ORAL_TABLET | Freq: Four times a day (QID) | ORAL | Status: DC | PRN
  Administered 2019-12-19 – 2019-12-20 (×2): 1 via ORAL
  Filled 2019-12-19 (×2): qty 1

## 2019-12-19 MED ORDER — ACETAMINOPHEN 500 MG PO TABS: 500.0000 mg | ORAL_TABLET | ORAL | Status: DC | PRN

## 2019-12-19 MED ORDER — LIFITEGRAST 5 % OP SOLN: 1.0000 [drp] | Freq: Two times a day (BID) | OPHTHALMIC | Status: DC

## 2019-12-19 MED ORDER — MAGNESIUM OXIDE 400 MG PO TABS
400.0000 mg | ORAL_TABLET | Freq: Every evening | ORAL | Status: DC | PRN
  Filled 2019-12-19: qty 1

## 2019-12-19 MED ORDER — DIMENHYDRINATE 50 MG PO TABS
50.0000 mg | ORAL_TABLET | ORAL | Status: DC | PRN
  Administered 2019-12-20: 21:00:00 50 mg via ORAL
  Filled 2019-12-19 (×4): qty 1

## 2019-12-19 MED ORDER — ONDANSETRON HCL 4 MG/2ML IJ SOLN
4.0000 mg | Freq: Four times a day (QID) | INTRAMUSCULAR | Status: DC | PRN
  Administered 2019-12-19: 4 mg via INTRAVENOUS
  Filled 2019-12-19: qty 2

## 2019-12-19 NOTE — ED Provider Notes (Signed)
[facility] Emergency Department Provider Note  ____________________________________________   First MD Initiated Contact with Patient 12/19/19 1050     (approximate)  I have reviewed the triage vital signs and the nursing notes.  History  Chief Complaint Mechanical Fall    HPI Kaitlyn Church is a 79 y.o. female past medical history as noted below, including Parkinson's, wheelchair-bound, anxiety/depression, esophageal motility disorder, GERD, fibromyalgia who presents from her assisted living facility after a fall with resultant right hip pain.  Patient states she fell out of her wheelchair and landed onto her right hip.  Pain is located to the right hip, does not radiate, 8/10 in severity, described as aching, discomfort, dull.  Worsened with movement and palpation, improved with rest.  She is not exactly sure as to why she fell out of her wheelchair, but does state that she had a headache prior to falling.  However, she states that this headache is very chronic.  She has had it for the last 3 years and it is constant and unchanged.  She denies any preceding chest pain, palpitations.   She is not on any blood thinning medications.   Past Medical Hx Past Medical History:  Diagnosis Date  . Abdominal pain, right upper quadrant   . Allergic rhinitis   . Anxiety   . Depression   . Esophageal motility disorder   . Fibromyalgia   . Gait disturbance   . GERD (gastroesophageal reflux disease)   . Grief reaction   . Hemorrhoids, internal, thrombosed   . Interstitial cystitis   . Irritable bowel syndrome   . Osteoporosis   . Parkinson disease ([place])   . Restless leg syndrome   . Shingles   . Sjogren's syndrome ([place])    ?  Marland Kitchen Trochanteric bursitis    bilateral  . Vaginitis     Problem List Patient Active Problem List   Diagnosis Date Noted  . Arthritis 06/01/2016  . B12 deficiency 06/01/2016  . Osteoporosis, post-menopausal 06/01/2016  .  Parkinson disease ([place]) 06/01/2016  . Sjogren's syndrome ([place]) 06/01/2016  . Chronic cystitis 06/01/2016  . Dysuria 04/16/2016  . Recurrent major depressive disorder, in partial remission ([place]) 01/07/2016  . Chronic constipation 09/24/2014  . Dysphagia 09/24/2014  . Raynaud's phenomenon without gangrene 09/24/2014  . [place] 04/20/2013  . Bilateral dry eyes 03/06/2012  . Blepharitis of both eyes 03/06/2012  . MGD (meibomian gland dysfunction) 03/06/2012  . DYSPHAGIA PHARYNGEAL PHASE 07/21/2009  . ESOPHAGEAL MOTILITY DISORDER 02/03/2009  . FIBROMYALGIA 02/03/2009  . TROCHANTERIC BURSITIS, BILATERAL 06/21/2008  . HAND PAIN 06/21/2008  . GAIT DISTURBANCE 06/21/2008  . ABDOMINAL, RIGHT UPPER, PAIN 06/21/2008  . GRIEF REACTION 05/30/2008  . VAGINITIS 05/30/2008  . OSTEOPOROSIS 05/30/2008  . HEMORRHOIDS, INTERNAL, THROMBOSED 03/22/2008  . CHANGE IN BOWELS 03/22/2008  . FATIGUE 03/10/2007  . RESTLESS LEG SYNDROME 02/19/2007  . Irritable bowel syndrome 02/19/2007  . ANXIETY 02/16/2007  . DEPRESSION 02/16/2007  . ALLERGIC RHINITIS 02/16/2007  . GERD 02/16/2007  . INTERSTITIAL CYSTITIS 02/16/2007  . [place] SYNDROME 02/16/2007  . FIBROSITIS 02/16/2007    Past Surgical Hx Past Surgical History:  Procedure Laterality Date  . ABDOMINAL HYSTERECTOMY  10/2002  . APPENDECTOMY  11/1969  . CYSTOCELE REPAIR  1980  . ESOPHAGOGASTRODUODENOSCOPY  01/2001   nl  . ESOPHAGOGASTRODUODENOSCOPY  09/2006   Normal Carlean Purl)  . EXPLORATORY LAPAROTOMY  09/1969  . HERNIA REPAIR  1980  . NASAL SEPTUM SURGERY  11/1976  . POLYPECTOMY  03/1972  Bladder  . [place]  . TONSILLECTOMY  1948    Medications Prior to Admission medications   Medication Sig Start Date End Date Taking? Authorizing Provider  acetaminophen (TYLENOL) 500 MG tablet Take 500 mg by mouth every 4 (four) hours as needed.    [provider]  ALPRAZolam Duanne Moron) 0.25 MG tablet Take by mouth 4 (four)  times daily.     [provider]  alum & mag hydroxide-simeth (MAALOX/MYLANTA) 200-200-20 MG/5ML suspension Take 30 mLs by mouth every 6 (six) hours as needed for indigestion or heartburn.    [provider]  antiseptic oral rinse (BIOTENE) LIQD 15 mLs by Mouth Rinse route as needed for dry mouth.    [provider]  bisacodyl (DULCOLAX) 10 MG suppository Place 10 mg rectally daily as needed for moderate constipation.    [provider]  carbidopa-levodopa (SINEMET CR) 50-200 MG tablet Take 1 tablet by mouth daily.    [provider]  Carbidopa-Levodopa ER (SINEMET CR) 25-100 MG tablet controlled release Take 1.5 tablets by mouth 3 (three) times daily.    [provider]  desonide (DESOWEN) 0.05 % cream Apply 1 application topically 2 (two) times daily as needed.     [provider]  dimenhyDRINATE (DRAMAMINE) 50 MG tablet Take 50 mg by mouth every 4 (four) hours as needed.    [provider]  estrogens, conjugated, (PREMARIN) 0.625 MG tablet Take 0.625 mg by mouth daily. Take daily for 21 days then do not take for 7 days.     [provider]  FLUoxetine (PROZAC) 10 MG tablet Take 5 mg by mouth daily.      [provider]  Glycerin (OASIS MOISTURIZING MOUTH SPRAY) 35 % LIQD Use as directed 4 sprays in the mouth or throat at bedtime.    [provider]  Glycerin, Laxative, (FLEET LIQUID GLYCERIN SUPP RE) Place 1 suppository rectally daily.    [provider]  guaifenesin (ROBAFEN) 100 MG/5ML syrup Take 200 mg by mouth 4 (four) times daily as needed for cough.    [provider]  Homeopathic Products (ARNICARE ARNICA EX) Apply 1 application topically daily as needed.    [provider]  ibuprofen (ADVIL,MOTRIN) 200 MG tablet Take 200 mg by mouth 3 (three) times daily.     [provider]  Lifitegrast Shirley Friar) 5 % SOLN Apply 1 drop to eye 2 (two) times daily.    [provider]  loperamide (IMODIUM) 2 MG capsule Take 2 mg by mouth as needed for diarrhea or loose stools.    [provider]  loratadine (CLARITIN) 10 MG tablet Take 5 mg by mouth daily as needed.     [provider]  magnesium hydroxide (MILK OF MAGNESIA) 400 MG/5ML suspension Take 30 mLs by mouth at bedtime as needed for mild constipation.    [provider]  magnesium oxide (MAG-OX) 400 MG tablet Take 400 mg by mouth at bedtime as needed.    [provider]  Meth-Hyo-M Bl-Na Phos-Ph Sal (URIBEL) 118 MG CAPS Take by mouth.    [provider]  Neomycin-Bacitracin-Polymyxin (TRIPLE ANTIBIOTIC) [phone] OINT Apply topically as directed.    [provider]  shark liver oil-cocoa butter (PREPARATION H) 0.25-88.44 % suppository Place 1 suppository rectally as needed for hemorrhoids.    [provider]  Wheat Dextrin (BENEFIBER) POWD Take 10 mLs by mouth 2 (two) times daily as needed.    [provider]  Allergies Lidocaine, Other, Propoxyphene, Ascorbic acid, Ascorbic acid, Azithromycin, Basil oil, Calcium, Celecoxib, Cephalexin, Dexamethasone, Doxycycline, Itraconazole, Meperidine hcl, Mirabegron, Morphine, Morphine sulfate, Nitrofurantoin, [place] oil, Pantoprazole sodium, Penicillins, Propoxyphene hcl, Sulfa antibiotics, Sulfamethoxazole-trimethoprim, and Thimerosal  Family Hx Family History  Problem Relation Age of Onset  . Breast cancer Mother   . Alcohol abuse Father   . Pneumonia Father   . Bipolar disorder Son   . Coronary artery disease Neg Hx   . Diabetes Neg Hx   . Hypertension Neg Hx   . Colon cancer Neg Hx   . Prostate cancer Neg Hx   . Kidney cancer Neg Hx     Social Hx Social History   Tobacco Use  . Smoking status: Former Research scientist (life sciences)  . Smokeless tobacco: Never Used  Substance Use Topics  . Alcohol use: No  . Drug use: No     Review of Systems  Constitutional: Negative for fever.  Negative for chills. Eyes: Negative for visual changes. ENT: Negative for sore throat. Cardiovascular: Negative for chest pain. Respiratory: Negative for shortness of breath. Gastrointestinal: Negative for nausea. Negative for vomiting.  Genitourinary: Negative for dysuria. Musculoskeletal: Positive for right hip pain. Skin: Negative for rash. Neurological: Positive for headaches.   Physical Exam  Vital Signs: ED Triage Vitals  Enc Vitals Group     BP 12/19/19 1041 (!) 150/71     Pulse Rate 12/19/19 1041 66     Resp 12/19/19 1041 18     Temp 12/19/19 1041 97.7 F (36.5 C)     Temp Source 12/19/19 1041 Oral     SpO2 12/19/19 1041 100 %     Weight 12/19/19 1043 (S) 94 lb 12.8 oz (43 kg)     Height 12/19/19 1043 (S) 5\' 5"  (1.651 m)     Head Circumference --      Peak Flow --      Pain Score 12/19/19 1042 8     Pain Loc --      Pain Edu? --      Excl. in [place]? --     Constitutional: Alert and oriented.  Elderly appearing, appears uncomfortable. Head: Normocephalic. Atraumatic. Eyes: Conjunctivae clear. Sclera anicteric. Pupils equal and symmetric. Nose: No masses or lesions. No congestion or rhinorrhea. Mouth/Throat: Wearing mask.  Neck: No stridor. Trachea midline.  No midline CS tenderness. Cardiovascular: Normal rate, regular rhythm. Extremities well perfused. Respiratory: Normal respiratory effort.  Lungs CTAB.  Chest wall is stable, nontender, no crepitance or flail chest. Gastrointestinal: Soft. Non-distended. Non-tender.  Genitourinary: Deferred. Musculoskeletal: TTP about the right hip.  Holds her RLE in flexion and internal rotation.  Compartments soft and compressible. Neurologic:  Normal speech and language.  Able to wiggle toes on the left. Skin: Skin is warm, dry and intact. No rash noted. Psychiatric: Mood and affect are appropriate for situation.   Radiology  Personally reviewed available imaging myself.   CT head/C-spine - IMPRESSION:  1. No acute  intracranial abnormality.  2. No evidence of acute fracture or traumatic listhesis of the  cervical spine.  3. Mild multilevel cervical spondylosis, similar to prior.   CT hip - IMPRESSION:  Acute mildly impacted subcapital fracture of the proximal right  femur.    Procedures  Procedure(s) performed (including critical care):  Procedures   Initial Impression / Assessment and Plan / MDM / ED Course  79 y.o. female who presents to the ED after falling out of her wheelchair, with resultant right hip pain  Ddx: Hip  fracture, dislocation, contusion  Will plan for labs, imaging.  Patient having difficult time with straightening her RLE for XR, will obtain CT head for further evaluation  Clinical Course as of Dec 18 1741  Wed Dec 19, 2019  1552 CT hip reveals subcapital fracture of the right proximal femur, will require admission.  Discussed w/ orthopedics, will plan for OR intervention tomorrow. Orthopedics asking for dedicated XR of the hip and pelvis for operative planning.  Patient was previously unable to obtain XR due to inability to straighten her leg secondary to pain.  She has several listed allergies to multiple different types of pain medication.  Discussed with pharmacy who recommends fentanyl as this should not cross-react with any of her prior medications.  Will order dose of fentanyl and reattempt XR.  Discussed with hospitalist for admission.   [SM]    Clinical Course User Index [SM] Lilia Pro., MD     _______________________________   As part of my medical decision making I have reviewed available labs, radiology tests, reviewed old records/performed chart review, obtained additional history from family, and discussed with consultants (Dr. Posey Pronto, orthopedics & pharmacy).     Final Clinical Impression(s) / ED Diagnosis  Final diagnoses:  Fall, initial encounter  Closed right hip fracture, initial encounter [facility])       Note:  This document was prepared  using Dragon voice recognition software and may include unintentional dictation errors.   Lilia Pro., MD 12/19/19 [phone]

## 2019-12-19 NOTE — ED Triage Notes (Signed)
Patient arrived via EMS from [facility]. Patient is AOx4 and ambulates with wheelchair. Staffed called 911 due to patient falling out of wheelchair on right side. Patient chief complaint is right sided hip pain. Patient denies hitting head, SOB and Chest Pain. Patient has had multiple falls.

## 2019-12-19 NOTE — Consult Note (Signed)
ORTHOPAEDIC CONSULTATION  REQUESTING PHYSICIAN: Fritzi Mandes, MD  Chief Complaint:   R hip pain  History of Present Illness: Kaitlyn Church is a 79 y.o. female who had a fall at her assisted living facility today.  The patient noted immediate hip pain and inability to ambulate.  She has a history of significant Parkinson's disease that is progression and affecting her balance and ambulation. She has had multiple falls recently. She is in a wheelchair most of the day, but attempts to ambulate short distances.  Pain is described as sharp at its worst and a dull ache at its best.  Pain is rated a 10 out of 10 in severity.  Pain is improved with rest and immobilization.  Pain is worse with any sort of movement.  X-rays in the emergency department show a right femoral neck fracture.  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 She also has a known history of osteoporosis.  Past Medical History:  Diagnosis Date  . Abdominal pain, right upper quadrant   . Allergic rhinitis   . Anxiety   . Depression   . Esophageal motility disorder   . Fibromyalgia   . Gait disturbance   . GERD (gastroesophageal reflux disease)   . Grief reaction   . Hemorrhoids, internal, thrombosed   . Interstitial cystitis   . Irritable bowel syndrome   . Osteoporosis   . Parkinson disease ([place])   . Restless leg syndrome   . Shingles   . Sjogren's syndrome ([place])    ?  Marland Kitchen Trochanteric bursitis    bilateral  . Vaginitis    Past Surgical History:  Procedure Laterality Date  . ABDOMINAL HYSTERECTOMY  10/2002  . APPENDECTOMY  11/1969  . CYSTOCELE REPAIR  1980  . ESOPHAGOGASTRODUODENOSCOPY  01/2001   nl  . ESOPHAGOGASTRODUODENOSCOPY  09/2006   Normal Carlean Purl)  . EXPLORATORY LAPAROTOMY  09/1969  . HERNIA REPAIR  1980  . NASAL SEPTUM SURGERY  11/1976  . POLYPECTOMY  03/1972   Bladder  . [place]  . TONSILLECTOMY   1948   Social History   Socioeconomic History  . Marital status: Widowed    Spouse name: Not on file  . Number of children: Not on file  . Years of education: Not on file  . Highest education level: Not on file  Occupational History  . Occupation: Homemaker  Tobacco Use  . Smoking status: Former Research scientist (life sciences)  . Smokeless tobacco: Never Used  Substance and Sexual Activity  . Alcohol use: No  . Drug use: No  . Sexual activity: Not Currently  Other Topics Concern  . Not on file  Social History Narrative   Widowed 6/09. 2 children. Homemaker. Retired. No caffeine use.    Social Determinants of Health   Financial Resource Strain:   . Difficulty of Paying Living Expenses:   Food Insecurity:   . Worried About Charity fundraiser in the Last Year:   . Arboriculturist in the Last Year:   Transportation Needs:   . Film/video editor (Medical):   Marland Kitchen Lack of Transportation (Non-Medical):   Physical Activity:   . Days of Exercise per Week:   . Minutes of Exercise per Session:   Stress:   . Feeling of Stress :   Social Connections:   . Frequency of Communication with Friends and Family:   . Frequency of Social Gatherings with Friends and Family:   . Attends Religious Services:   .  Active Member of Clubs or Organizations:   . Attends Archivist Meetings:   Marland Kitchen Marital Status:    Family History  Problem Relation Age of Onset  . Breast cancer Mother   . Alcohol abuse Father   . Pneumonia Father   . Bipolar disorder Son   . Coronary artery disease Neg Hx   . Diabetes Neg Hx   . Hypertension Neg Hx   . Colon cancer Neg Hx   . Prostate cancer Neg Hx   . Kidney cancer Neg Hx    Allergies  Allergen Reactions  . Lidocaine Other (See Comments)  . Other Other (See Comments)    darvocet. darvocet - HALLUCINATIONS  . Propoxyphene Other (See Comments)    Other Reaction: hallucinations, weird dreams  . Ascorbic Acid Other (See Comments)  . Ascorbic Acid Other (See  Comments)  . Azithromycin Other (See Comments)    Other Reaction: sore, swollen tongue  . Basil Oil Other (See Comments)  . Calcium Other (See Comments)  . Celecoxib Other (See Comments)  . Cephalexin     REACTION: felt tired but tolerated  . Dexamethasone Other (See Comments)    Other Reaction: insomnia, chest spasms  . Doxycycline Hives  . Itraconazole Other (See Comments)  . Meperidine Hcl Other (See Comments)  . Mirabegron Other (See Comments)    intolerant  . Morphine Hives  . Morphine Sulfate   . Nitrofurantoin Other (See Comments)    Swollen lip and tongue Blisters on tongue  . [place] Oil Other (See Comments)  . Pantoprazole Sodium Other (See Comments)    REACTION: diarrhea REACTION: diarrhea  . Penicillins Other (See Comments)    Has patient had a PCN reaction causing immediate rash, facial/tongue/throat swelling, SOB or lightheadedness with hypotension: Unknown Has patient had a PCN reaction causing severe rash involving mucus membranes or skin necrosis: Unknown Has patient had a PCN reaction that required hospitalization: Unknown Has patient had a PCN reaction occurring within the last 10 years: Unknown If all of the above answers are "NO", then may proceed with Cephalosporin use.   Marland Kitchen Propoxyphene Hcl   . Sulfa Antibiotics Other (See Comments)    Swollen lip and tongue Blisters on tongue  . Sulfamethoxazole-Trimethoprim Other (See Comments)  . Thimerosal Other (See Comments)    Eye burning   Prior to Admission medications   Medication Sig Start Date End Date Taking? Authorizing Provider  acetaminophen (TYLENOL) 500 MG tablet Take 500 mg by mouth every 4 (four) hours as needed.   Yes [provider]  alum & mag hydroxide-simeth (MAALOX/MYLANTA) 200-200-20 MG/5ML suspension Take 30 mLs by mouth 4 (four) times daily as needed for indigestion or heartburn.    Yes [provider]  antiseptic oral rinse (BIOTENE) LIQD 15 mLs by Mouth Rinse route as  needed for dry mouth.   Yes [provider]  carbidopa-levodopa (SINEMET IR) 25-100 MG tablet Take 1.5 tablets by mouth 5 (five) times daily. (0600, 1030, 1445, 1830, 2230)   Yes [provider]  dimenhyDRINATE (DRAMAMINE) 50 MG tablet Take 50 mg by mouth every 4 (four) hours as needed (muscle spasms).    Yes [provider]  estrogens, conjugated, (PREMARIN) 0.625 MG tablet Take 0.625 mg by mouth daily.    Yes [provider]  FLUoxetine (PROZAC) 10 MG tablet Take 5 mg by mouth daily.     Yes [provider]  guaifenesin (ROBAFEN) 100 MG/5ML syrup Take 200 mg by mouth 4 (four)  times daily as needed for cough.   Yes [provider]  ibuprofen (ADVIL,MOTRIN) 200 MG tablet Take 200 mg by mouth 3 (three) times daily.    Yes [provider]  Lifitegrast Shirley Friar) 5 % SOLN Place 1 drop into both eyes 2 (two) times daily.    Yes [provider]  loperamide (IMODIUM) 2 MG capsule Take 2 mg by mouth as needed for diarrhea or loose stools. (max 8 doses daily)   Yes [provider]  loratadine (CLARITIN) 10 MG tablet Take 5 mg by mouth daily as needed for allergies.    Yes [provider]  magnesium hydroxide (MILK OF MAGNESIA) 400 MG/5ML suspension Take 30 mLs by mouth at bedtime as needed for mild constipation.   Yes [provider]  magnesium oxide (MAG-OX) 400 MG tablet Take 400 mg by mouth at bedtime as needed (muscle cramps).    Yes [provider]  Meth-Hyo-M Bl-Na Phos-Ph Sal (URIBEL) 118 MG CAPS Take 118 mg by mouth in the morning and at bedtime.    Yes [provider]  Neomycin-Bacitracin-Polymyxin (TRIPLE ANTIBIOTIC) [phone] OINT Apply 1 application topically daily as needed (wound care).    Yes [provider]  shark liver oil-cocoa butter (PREPARATION H) 0.25-88.44 % suppository Place 1 suppository rectally as needed for hemorrhoids.   Yes [provider]   traMADol (ULTRAM) 50 MG tablet Take 50 mg by mouth every 6 (six) hours as needed for moderate pain.   Yes [provider]   Recent Labs    12/19/19 1547  WBC 9.6  HGB 12.2  HCT 38.4  PLT 230  K 3.9  CL 104  CO2 26  BUN 19  CREATININE 0.60  GLUCOSE 104*  CALCIUM 8.7*  INR 0.9   CT Head Wo Contrast  Result Date: 12/19/2019 CLINICAL DATA:  Fall from wheelchair EXAM: CT HEAD WITHOUT CONTRAST CT CERVICAL SPINE WITHOUT CONTRAST TECHNIQUE: Multidetector CT imaging of the head and cervical spine was performed following the standard protocol without intravenous contrast. Multiplanar CT image reconstructions of the cervical spine were also generated. COMPARISON:  11/15/2019 FINDINGS: CT HEAD FINDINGS Brain: No evidence of acute infarction, hemorrhage, hydrocephalus, extra-axial collection or mass lesion/mass effect. Mild age related cerebral volume loss. Vascular: Atherosclerotic calcifications involving the large vessels of the skull base. No unexpected hyperdense vessel. Skull: Normal. Negative for fracture or focal lesion. Sinuses/Orbits: No acute finding. Other: Depressed bilateral nasal bone fractures, unchanged from prior. CT CERVICAL SPINE FINDINGS Alignment: Facet joints are aligned without traumatic listhesis. Dens and lateral masses are aligned. Straightening of the cervical lordosis, which may be positional. Skull base and vertebrae: No acute fracture. No primary bone lesion or focal pathologic process. Soft tissues and spinal canal: No prevertebral fluid or swelling. No visible canal hematoma. Disc levels: Similar multilevel intervertebral disc height loss and degenerative endplate changes. Relatively mild facet arthropathy. No interval progression from prior. Upper chest: Biapical pleuroparenchymal scarring. Lung apices otherwise clear. Other: None. IMPRESSION: 1. No acute intracranial abnormality. 2. No evidence of acute fracture or traumatic listhesis of the cervical spine. 3.  Mild multilevel cervical spondylosis, similar to prior. Electronically Signed   By: Davina Poke D.O.   On: 12/19/2019 12:28   CT Cervical Spine Wo Contrast  Result Date: 12/19/2019 CLINICAL DATA:  Fall from wheelchair EXAM: CT HEAD WITHOUT CONTRAST CT CERVICAL SPINE WITHOUT CONTRAST TECHNIQUE: Multidetector CT imaging of the head and cervical spine was performed following the standard protocol without intravenous contrast.  Multiplanar CT image reconstructions of the cervical spine were also generated. COMPARISON:  11/15/2019 FINDINGS: CT HEAD FINDINGS Brain: No evidence of acute infarction, hemorrhage, hydrocephalus, extra-axial collection or mass lesion/mass effect. Mild age related cerebral volume loss. Vascular: Atherosclerotic calcifications involving the large vessels of the skull base. No unexpected hyperdense vessel. Skull: Normal. Negative for fracture or focal lesion. Sinuses/Orbits: No acute finding. Other: Depressed bilateral nasal bone fractures, unchanged from prior. CT CERVICAL SPINE FINDINGS Alignment: Facet joints are aligned without traumatic listhesis. Dens and lateral masses are aligned. Straightening of the cervical lordosis, which may be positional. Skull base and vertebrae: No acute fracture. No primary bone lesion or focal pathologic process. Soft tissues and spinal canal: No prevertebral fluid or swelling. No visible canal hematoma. Disc levels: Similar multilevel intervertebral disc height loss and degenerative endplate changes. Relatively mild facet arthropathy. No interval progression from prior. Upper chest: Biapical pleuroparenchymal scarring. Lung apices otherwise clear. Other: None. IMPRESSION: 1. No acute intracranial abnormality. 2. No evidence of acute fracture or traumatic listhesis of the cervical spine. 3. Mild multilevel cervical spondylosis, similar to prior. Electronically Signed   By: Davina Poke D.O.   On: 12/19/2019 12:28   CT Hip Right Wo Contrast  Result  Date: 12/19/2019 CLINICAL DATA:  Fall, right hip pain EXAM: CT OF THE RIGHT HIP WITHOUT CONTRAST TECHNIQUE: Multidetector CT imaging of the right hip was performed according to the standard protocol. Multiplanar CT image reconstructions were also generated. COMPARISON:  None. FINDINGS: Bones/Joint/Cartilage Acute mildly impacted subcapital fracture of the proximal right femur. Mildly displaced 1.5 cm cortical fragment along the posteroinferior aspect of the fracture line (series 6, image 40; series 5, image 54). No definite extension to the femoral head articular surface or to the intertrochanteric region. Right hip joint alignment is maintained without dislocation. Acetabulum and visualized right hemipelvis is intact. Pubic symphysis and visualized right SI joint are intact without diastasis. Small right hip joint effusion. Ligaments Suboptimally assessed by CT. Muscles and Tendons No acute musculotendinous findings by CT. Soft tissues Mild induration within the lateral soft tissues. No focal fluid collection or hematoma. IMPRESSION: Acute mildly impacted subcapital fracture of the proximal right femur. Electronically Signed   By: Davina Poke D.O.   On: 12/19/2019 14:32   DG Hip Unilat With Pelvis 2-3 Views Right  Result Date: 12/19/2019 CLINICAL DATA:  [place] out of wheelchair, right-sided hip pain EXAM: DG HIP (WITH OR WITHOUT PELVIS) 2-3V RIGHT COMPARISON:  None. FINDINGS: Frontal view of the pelvis as well as frontal and cross-table lateral views of the right hip are obtained. There is an impacted subcapital right femoral neck fracture with resulting internal rotation of the right hip. No dislocation. Soft tissues are unremarkable. IMPRESSION: 1. Subcapital right femoral neck fracture. Electronically Signed   By: Randa Ngo M.D.   On: 12/19/2019 15:59     Positive ROS: All other systems have been reviewed and were otherwise negative with the exception of those mentioned in the HPI and as  above.  Physical Exam: BP (!) 152/87   Pulse 77   Temp 97.7 F (36.5 C) (Oral)   Resp 18   Ht (S) 5\' 5"  (1.651 m)   Wt (S) 43 kg   SpO2 100%   BMI 15.78 kg/m  General:  Alert, no acute distress Psychiatric:  Patient is competent for consent with normal mood and affect   Cardiovascular:  No pedal edema, regular rate and rhythm Respiratory:  No wheezing, non-labored breathing GI:  Abdomen is soft and non-tender Skin:  No lesions in the area of chief complaint, no erythema Neurologic:  Sensation intact distally, CN grossly intact Lymphatic:  No axillary or cervical lymphadenopathy  Orthopedic Exam:  RLE: 5/5 DF/PF/EHL SILT s/s/t/sp/dp distr Foot wwp +Log roll/axial load   Imaging:  As above: R valgus impacted femoral neck fracture with increased displacement/retroversion compared to standard valgus impacted fracture.  Assessment/Plan: KAHDIJAH WARSTLER is a 79 y.o. female with a R femoral neck fracture   1. I discussed the various treatment options including both surgical and non-surgical management of her fracture with the patient and her friend (medical PoA). We agreed to proceed with surgical management. We discussed options would include either hip hemiarthroplasty or percutaneous pinning. She has a valgus impacted femoral neck fracture with more displacement than usual with some retroversion of the femoral head. We discussed the high risk of perioperative complications due to patient's age and other co-morbidities. After discussion of risks, benefits, and alternatives to both surgical options, the patient and PoA were in agreement to proceed with percutaneous pinning of the R hip. They understand that there is a risk of malunion/nonunion and AVN with this, and she may wish to undergo conversion to hemiarthroplasty in the future. The goals of surgery would be to provide adequate pain relief and allow for mobilization. Plan for surgery is R hip percutaneous pinning tomorrow,  12/20/19 2. NPO after midnight 3. Hold anticoagulation in advance of OR 4. Admit to [place]   12/19/2019 5:37 PM

## 2019-12-19 NOTE — H&P (Addendum)
[name] NAME: Kaitlyn Church    MR#:  [id number]  DATE OF BIRTH:  1941/08/15  DATE OF ADMISSION:  12/19/2019  PRIMARY CARE PHYSICIAN: Adin Hector, MD   REQUESTING/REFERRING PHYSICIAN: Dr Derrell Lolling  Patient coming from : [facility]  At baseline is wheelchair-bound. CHIEF COMPLAINT:  mechanical fall right hip pain today  HISTORY OF PRESENT ILLNESS:  Markeitha Church  is a 79 y.o. female with a known history of Parkinsons disease is wheelchair-bound, anxiety/depression, esophageal motility disorder,GERD, fibromyalgia comes to the emergency room from after she had slid off her wheelchair and started having right-sided pain.  ED course: in the ER patient was found to have right sub capital femoral neck fracture. ER physician spoke with Dr. Posey Pronto orthopedic. Patient will be admitted for further evaluation management. COVID pending vitals stable No family at bedside  PAST MEDICAL HISTORY:   Past Medical History:  Diagnosis Date  . Abdominal pain, right upper quadrant   . Allergic rhinitis   . Anxiety   . Depression   . Esophageal motility disorder   . Fibromyalgia   . Gait disturbance   . GERD (gastroesophageal reflux disease)   . Grief reaction   . Hemorrhoids, internal, thrombosed   . Interstitial cystitis   . Irritable bowel syndrome   . Osteoporosis   . Parkinson disease ([place])   . Restless leg syndrome   . Shingles   . Sjogren's syndrome ([place])    ?  Marland Kitchen Trochanteric bursitis    bilateral  . Vaginitis     PAST SURGICAL HISTOIRY:   Past Surgical History:  Procedure Laterality Date  . ABDOMINAL HYSTERECTOMY  10/2002  . APPENDECTOMY  11/1969  . CYSTOCELE REPAIR  1980  . ESOPHAGOGASTRODUODENOSCOPY  01/2001   nl  . ESOPHAGOGASTRODUODENOSCOPY  09/2006   Normal Carlean Purl)  . EXPLORATORY LAPAROTOMY  09/1969  . HERNIA REPAIR  1980  . NASAL SEPTUM SURGERY  11/1976  . POLYPECTOMY   03/1972   Bladder  . [place]  . TONSILLECTOMY  1948    SOCIAL HISTORY:   Social History   Tobacco Use  . Smoking status: Former Research scientist (life sciences)  . Smokeless tobacco: Never Used  Substance Use Topics  . Alcohol use: No    FAMILY HISTORY:   Family History  Problem Relation Age of Onset  . Breast cancer Mother   . Alcohol abuse Father   . Pneumonia Father   . Bipolar disorder Son   . Coronary artery disease Neg Hx   . Diabetes Neg Hx   . Hypertension Neg Hx   . Colon cancer Neg Hx   . Prostate cancer Neg Hx   . Kidney cancer Neg Hx     DRUG ALLERGIES:   Allergies  Allergen Reactions  . Lidocaine Other (See Comments)  . Other Other (See Comments)    darvocet. darvocet - HALLUCINATIONS  . Propoxyphene Other (See Comments)    Other Reaction: hallucinations, weird dreams  . Ascorbic Acid Other (See Comments)  . Ascorbic Acid Other (See Comments)  . Azithromycin Other (See Comments)    Other Reaction: sore, swollen tongue  . Basil Oil Other (See Comments)  . Calcium Other (See Comments)  . Celecoxib Other (See Comments)  . Cephalexin     REACTION: felt tired but tolerated  . Dexamethasone Other (See Comments)    Other Reaction: insomnia, chest spasms  . Doxycycline Hives  . Itraconazole  Other (See Comments)  . Meperidine Hcl Other (See Comments)  . Mirabegron Other (See Comments)    intolerant  . Morphine Hives  . Morphine Sulfate   . Nitrofurantoin Other (See Comments)    Swollen lip and tongue Blisters on tongue  . [place] Oil Other (See Comments)  . Pantoprazole Sodium Other (See Comments)    REACTION: diarrhea REACTION: diarrhea  . Penicillins Other (See Comments)    Has patient had a PCN reaction causing immediate rash, facial/tongue/throat swelling, SOB or lightheadedness with hypotension: Unknown Has patient had a PCN reaction causing severe rash involving mucus membranes or skin necrosis: Unknown Has patient had a PCN reaction that required  hospitalization: Unknown Has patient had a PCN reaction occurring within the last 10 years: Unknown If all of the above answers are "NO", then may proceed with Cephalosporin use.   Marland Kitchen Propoxyphene Hcl   . Sulfa Antibiotics Other (See Comments)    Swollen lip and tongue Blisters on tongue  . Sulfamethoxazole-Trimethoprim Other (See Comments)  . Thimerosal Other (See Comments)    Eye burning    REVIEW OF SYSTEMS:  Review of Systems  Constitutional: Negative for chills, fever and weight loss.  HENT: Negative for ear discharge, ear pain and nosebleeds.   Eyes: Negative for blurred vision, pain and discharge.  Respiratory: Negative for sputum production, shortness of breath, wheezing and stridor.   Cardiovascular: Negative for chest pain, palpitations, orthopnea and PND.  Gastrointestinal: Negative for abdominal pain, diarrhea, nausea and vomiting.  Genitourinary: Negative for frequency and urgency.  Musculoskeletal: Positive for joint pain. Negative for back pain.  Neurological: Positive for weakness. Negative for sensory change, speech change and focal weakness.  Psychiatric/Behavioral: Negative for depression and hallucinations. The patient is not nervous/anxious.      MEDICATIONS AT HOME:   Prior to Admission medications   Medication Sig Start Date End Date Taking? Authorizing Provider  acetaminophen (TYLENOL) 500 MG tablet Take 500 mg by mouth every 4 (four) hours as needed.   Yes [provider]  alum & mag hydroxide-simeth (MAALOX/MYLANTA) 200-200-20 MG/5ML suspension Take 30 mLs by mouth 4 (four) times daily as needed for indigestion or heartburn.    Yes [provider]  antiseptic oral rinse (BIOTENE) LIQD 15 mLs by Mouth Rinse route as needed for dry mouth.   Yes [provider]  carbidopa-levodopa (SINEMET IR) 25-100 MG tablet Take 1.5 tablets by mouth 5 (five) times daily. (0600, 1030, 1445, 1830, 2230)   Yes [provider]  dimenhyDRINATE  (DRAMAMINE) 50 MG tablet Take 50 mg by mouth every 4 (four) hours as needed (muscle spasms).    Yes [provider]  estrogens, conjugated, (PREMARIN) 0.625 MG tablet Take 0.625 mg by mouth daily.    Yes [provider]  FLUoxetine (PROZAC) 10 MG tablet Take 5 mg by mouth daily.     Yes [provider]  guaifenesin (ROBAFEN) 100 MG/5ML syrup Take 200 mg by mouth 4 (four) times daily as needed for cough.   Yes [provider]  ibuprofen (ADVIL,MOTRIN) 200 MG tablet Take 200 mg by mouth 3 (three) times daily.    Yes [provider]  Lifitegrast Shirley Friar) 5 % SOLN Place 1 drop into both eyes 2 (two) times daily.    Yes [provider]  loperamide (IMODIUM) 2 MG capsule Take 2 mg by mouth as needed for diarrhea or loose stools. (max 8 doses daily)   Yes [provider]  loratadine (CLARITIN) 10 MG  tablet Take 5 mg by mouth daily as needed for allergies.    Yes [provider]  magnesium hydroxide (MILK OF MAGNESIA) 400 MG/5ML suspension Take 30 mLs by mouth at bedtime as needed for mild constipation.   Yes [provider]  magnesium oxide (MAG-OX) 400 MG tablet Take 400 mg by mouth at bedtime as needed (muscle cramps).    Yes [provider]  Meth-Hyo-M Bl-Na Phos-Ph Sal (URIBEL) 118 MG CAPS Take 118 mg by mouth in the morning and at bedtime.    Yes [provider]  Neomycin-Bacitracin-Polymyxin (TRIPLE ANTIBIOTIC) [phone] OINT Apply 1 application topically daily as needed (wound care).    Yes [provider]  shark liver oil-cocoa butter (PREPARATION H) 0.25-88.44 % suppository Place 1 suppository rectally as needed for hemorrhoids.   Yes [provider]  traMADol (ULTRAM) 50 MG tablet Take 50 mg by mouth every 6 (six) hours as needed for moderate pain.   Yes [provider]      VITAL SIGNS:  Blood pressure (!) 152/87, pulse 77, temperature 97.7 F (36.5 C), temperature  source Oral, resp. rate 18, height (S) 5\' 5"  (1.651 m), weight (S) 43 kg, SpO2 100 %.  PHYSICAL EXAMINATION:  GENERAL:  79 y.o.-year-old patient lying in the bed with no acute distress. Thin, fraile EYES: Pupils equal, round, reactive to light and accommodation. No scleral icterus.  HEENT: Head atraumatic, normocephalic. Oropharynx and nasopharynx clear.  NECK:  Supple, no jugular venous distention. No thyroid enlargement, no tenderness.  LUNGS: Normal breath sounds bilaterally, no wheezing, rales,rhonchi or crepitation. No use of accessory muscles of respiration.  CARDIOVASCULAR: S1, S2 normal. No murmurs, rubs, or gallops.  ABDOMEN: Soft, nontender, nondistended. Bowel sounds present. No organomegaly or mass.  EXTREMITIES: No pedal edema, cyanosis, or clubbing. Restricted range of motion right lower extremity NEUROLOGIC: unable to do full neuro- exam. Moves 3/4 extremities well. PSYCHIATRIC: The patient is alert and oriented x 3.  SKIN: No obvious rash, lesion, or ulcer.   LABORATORY PANEL:   CBC Recent Labs  Lab 12/19/19 1547  WBC 9.6  HGB 12.2  HCT 38.4  PLT 230   ------------------------------------------------------------------------------------------------------------------  Chemistries  Recent Labs  Lab 12/19/19 1547  NA 138  K 3.9  CL 104  CO2 26  GLUCOSE 104*  BUN 19  CREATININE 0.60  CALCIUM 8.7*   ------------------------------------------------------------------------------------------------------------------  Cardiac Enzymes No results for input(s): TROPONINI in the last 168 hours. ------------------------------------------------------------------------------------------------------------------  RADIOLOGY:  CT Head Wo Contrast  Result Date: 12/19/2019 CLINICAL DATA:  Fall from wheelchair EXAM: CT HEAD WITHOUT CONTRAST CT CERVICAL SPINE WITHOUT CONTRAST TECHNIQUE: Multidetector CT imaging of the head and cervical spine was performed following the  standard protocol without intravenous contrast. Multiplanar CT image reconstructions of the cervical spine were also generated. COMPARISON:  11/15/2019 FINDINGS: CT HEAD FINDINGS Brain: No evidence of acute infarction, hemorrhage, hydrocephalus, extra-axial collection or mass lesion/mass effect. Mild age related cerebral volume loss. Vascular: Atherosclerotic calcifications involving the large vessels of the skull base. No unexpected hyperdense vessel. Skull: Normal. Negative for fracture or focal lesion. Sinuses/Orbits: No acute finding. Other: Depressed bilateral nasal bone fractures, unchanged from prior. CT CERVICAL SPINE FINDINGS Alignment: Facet joints are aligned without traumatic listhesis. Dens and lateral masses are aligned. Straightening of the cervical lordosis, which may be positional. Skull base and vertebrae: No acute fracture. No primary bone lesion or focal pathologic process. Soft tissues and spinal canal: No prevertebral fluid or swelling. No visible canal hematoma. Disc  levels: Similar multilevel intervertebral disc height loss and degenerative endplate changes. Relatively mild facet arthropathy. No interval progression from prior. Upper chest: Biapical pleuroparenchymal scarring. Lung apices otherwise clear. Other: None. IMPRESSION: 1. No acute intracranial abnormality. 2. No evidence of acute fracture or traumatic listhesis of the cervical spine. 3. Mild multilevel cervical spondylosis, similar to prior. Electronically Signed   By: Davina Poke D.O.   On: 12/19/2019 12:28   CT Cervical Spine Wo Contrast  Result Date: 12/19/2019 CLINICAL DATA:  Fall from wheelchair EXAM: CT HEAD WITHOUT CONTRAST CT CERVICAL SPINE WITHOUT CONTRAST TECHNIQUE: Multidetector CT imaging of the head and cervical spine was performed following the standard protocol without intravenous contrast. Multiplanar CT image reconstructions of the cervical spine were also generated. COMPARISON:  11/15/2019 FINDINGS: CT  HEAD FINDINGS Brain: No evidence of acute infarction, hemorrhage, hydrocephalus, extra-axial collection or mass lesion/mass effect. Mild age related cerebral volume loss. Vascular: Atherosclerotic calcifications involving the large vessels of the skull base. No unexpected hyperdense vessel. Skull: Normal. Negative for fracture or focal lesion. Sinuses/Orbits: No acute finding. Other: Depressed bilateral nasal bone fractures, unchanged from prior. CT CERVICAL SPINE FINDINGS Alignment: Facet joints are aligned without traumatic listhesis. Dens and lateral masses are aligned. Straightening of the cervical lordosis, which may be positional. Skull base and vertebrae: No acute fracture. No primary bone lesion or focal pathologic process. Soft tissues and spinal canal: No prevertebral fluid or swelling. No visible canal hematoma. Disc levels: Similar multilevel intervertebral disc height loss and degenerative endplate changes. Relatively mild facet arthropathy. No interval progression from prior. Upper chest: Biapical pleuroparenchymal scarring. Lung apices otherwise clear. Other: None. IMPRESSION: 1. No acute intracranial abnormality. 2. No evidence of acute fracture or traumatic listhesis of the cervical spine. 3. Mild multilevel cervical spondylosis, similar to prior. Electronically Signed   By: Davina Poke D.O.   On: 12/19/2019 12:28   CT Hip Right Wo Contrast  Result Date: 12/19/2019 CLINICAL DATA:  Fall, right hip pain EXAM: CT OF THE RIGHT HIP WITHOUT CONTRAST TECHNIQUE: Multidetector CT imaging of the right hip was performed according to the standard protocol. Multiplanar CT image reconstructions were also generated. COMPARISON:  None. FINDINGS: Bones/Joint/Cartilage Acute mildly impacted subcapital fracture of the proximal right femur. Mildly displaced 1.5 cm cortical fragment along the posteroinferior aspect of the fracture line (series 6, image 40; series 5, image 54). No definite extension to the  femoral head articular surface or to the intertrochanteric region. Right hip joint alignment is maintained without dislocation. Acetabulum and visualized right hemipelvis is intact. Pubic symphysis and visualized right SI joint are intact without diastasis. Small right hip joint effusion. Ligaments Suboptimally assessed by CT. Muscles and Tendons No acute musculotendinous findings by CT. Soft tissues Mild induration within the lateral soft tissues. No focal fluid collection or hematoma. IMPRESSION: Acute mildly impacted subcapital fracture of the proximal right femur. Electronically Signed   By: Davina Poke D.O.   On: 12/19/2019 14:32   DG Hip Unilat With Pelvis 2-3 Views Right  Result Date: 12/19/2019 CLINICAL DATA:  [place] out of wheelchair, right-sided hip pain EXAM: DG HIP (WITH OR WITHOUT PELVIS) 2-3V RIGHT COMPARISON:  None. FINDINGS: Frontal view of the pelvis as well as frontal and cross-table lateral views of the right hip are obtained. There is an impacted subcapital right femoral neck fracture with resulting internal rotation of the right hip. No dislocation. Soft tissues are unremarkable. IMPRESSION: 1. Subcapital right femoral neck fracture. Electronically Signed   By: Legrand Como  Owens Shark M.D.   On: 12/19/2019 15:59    EKG:    IMPRESSION AND PLAN:   Kiesa Sakal  is a 79 y.o. female with a known history of Parkinsons disease is wheelchair-bound, anxiety/depression, esophageal motility disorder,GERD, fibromyalgia comes to the emergency room from after she had slid off her wheelchair and started having right-sided pain.  1.Right sub capital femoral neck fracture status post mechanical fall -patient has Parkinson's disease. She is wheelchair-bound. She had a mechanical fall today at [place] -admit to orthopedic -IV fluids -PRN pain meds -orthopedic consultation with Dr. Posey Pronto-- plans for surgery tomorrow -NPO after midnight -DVT prophylaxis after surgery, continue SCD -patient  does not have history of CAD. Denies any chest pain shortness of breath. -Addendum: pt is at a low-intermediate risk for surgery. She understands risks and benefits of it.  2. Parkinson's disease -cont sinemet -patient is wheelchair-bound. She [facility] with [facility]y at [facility]  3. Esophageal motility disorder -soft diet -consider ST if needed  4.Fibromylagia -resume home meds  5. DVT prophylaxis SCD for now till surgery is done  Family Communication : Consults : orthopedic Dr. Leim Fabry Code Status : DNR prior to admission. This was discussed with patient in the ER DVT prophylaxis :SCD  TOTAL TIME TAKING CARE OF THIS PATIENT: *45* minutes.    Fritzi Mandes M.D  [facility]     CC: Primary care physician; Adin Hector, MD

## 2019-12-20 ENCOUNTER — Encounter: Payer: Self-pay | Admitting: Internal Medicine

## 2019-12-20 ENCOUNTER — Inpatient Hospital Stay: Payer: PPO

## 2019-12-20 ENCOUNTER — Inpatient Hospital Stay: Payer: PPO | Admitting: Anesthesiology

## 2019-12-20 ENCOUNTER — Encounter
Admission: EM | Disposition: A | Discharge status: Skilled Nursing Facility | Payer: Self-pay | Source: Skilled Nursing Facility | Attending: Internal Medicine

## 2019-12-20 HISTORY — PX: HIP ARTHROPLASTY: SHX981

## 2019-12-20 LAB — MRSA PCR SCREENING: MRSA by PCR: NEGATIVE

## 2019-12-20 LAB — CBC
HCT: 34.8 % — ABNORMAL LOW (ref 36.0–46.0)
Hemoglobin: 11.2 g/dL — ABNORMAL LOW (ref 12.0–15.0)
MCH: 30.1 pg (ref 26.0–34.0)
MCHC: 32.2 g/dL (ref 30.0–36.0)
MCV: 93.5 fL (ref 80.0–100.0)
Platelets: 195 10*3/uL (ref 150–400)
RBC: 3.72 MIL/uL — ABNORMAL LOW (ref 3.87–5.11)
RDW: 13 % (ref 11.5–15.5)
WBC: 8.8 10*3/uL (ref 4.0–10.5)
nRBC: 0 % (ref 0.0–0.2)

## 2019-12-20 SURGERY — HEMIARTHROPLASTY, HIP, DIRECT ANTERIOR APPROACH, FOR FRACTURE
Anesthesia: Spinal | Site: Hip | Laterality: Right

## 2019-12-20 MED ORDER — METOCLOPRAMIDE HCL 5 MG/ML IJ SOLN: 5.0000 mg | Freq: Three times a day (TID) | INTRAMUSCULAR | Status: DC | PRN

## 2019-12-20 MED ORDER — TRANEXAMIC ACID 1000 MG/10ML IV SOLN
INTRAVENOUS | Status: AC
  Filled 2019-12-20: qty 10

## 2019-12-20 MED ORDER — LIDOCAINE HCL (PF) 2 % IJ SOLN
INTRAMUSCULAR | Status: AC
  Filled 2019-12-20: qty 5

## 2019-12-20 MED ORDER — TRAMADOL HCL 50 MG PO TABS
50.0000 mg | ORAL_TABLET | Freq: Four times a day (QID) | ORAL | Status: DC | PRN
  Administered 2019-12-21 – 2019-12-24 (×4): 50 mg via ORAL
  Filled 2019-12-20 (×4): qty 1

## 2019-12-20 MED ORDER — ENOXAPARIN SODIUM 40 MG/0.4ML ~~LOC~~ SOLN
40.0000 mg | SUBCUTANEOUS | Status: DC
  Administered 2019-12-21: 10:00:00 40 mg via SUBCUTANEOUS
  Filled 2019-12-20: qty 0.4

## 2019-12-20 MED ORDER — BUPIVACAINE HCL (PF) 0.5 % IJ SOLN
INTRAMUSCULAR | Status: DC | PRN
  Administered 2019-12-20: 2.5 mL

## 2019-12-20 MED ORDER — DEXMEDETOMIDINE HCL 200 MCG/2ML IV SOLN
INTRAVENOUS | Status: DC | PRN
  Administered 2019-12-20: 20 ug via INTRAVENOUS

## 2019-12-20 MED ORDER — ACETAMINOPHEN 10 MG/ML IV SOLN
INTRAVENOUS | Status: AC
  Filled 2019-12-20: qty 100

## 2019-12-20 MED ORDER — FLUOXETINE HCL 20 MG/5ML PO SOLN
5.0000 mg | Freq: Every day | ORAL | Status: DC
  Administered 2019-12-21 – 2019-12-24 (×4): 5 mg via ORAL
  Filled 2019-12-20: qty 5
  Filled 2019-12-20: qty 1.25
  Filled 2019-12-20 (×3): qty 5

## 2019-12-20 MED ORDER — BUPIVACAINE LIPOSOME 1.3 % IJ SUSP
INTRAMUSCULAR | Status: DC | PRN
  Administered 2019-12-20: 50 mL

## 2019-12-20 MED ORDER — OXYCODONE HCL 5 MG PO TABS
2.5000 mg | ORAL_TABLET | ORAL | Status: DC | PRN
  Administered 2019-12-24: 03:00:00 5 mg via ORAL
  Filled 2019-12-20: qty 1

## 2019-12-20 MED ORDER — METHOCARBAMOL 1000 MG/10ML IJ SOLN
500.0000 mg | Freq: Four times a day (QID) | INTRAVENOUS | Status: DC | PRN
  Filled 2019-12-20: qty 5

## 2019-12-20 MED ORDER — HYDROMORPHONE HCL 1 MG/ML IJ SOLN: 0.2000 mg | INTRAMUSCULAR | Status: DC | PRN

## 2019-12-20 MED ORDER — CEFAZOLIN SODIUM-DEXTROSE 1-4 GM/50ML-% IV SOLN
1.0000 g | Freq: Four times a day (QID) | INTRAVENOUS | Status: AC
  Administered 2019-12-20 – 2019-12-21 (×3): 1 g via INTRAVENOUS
  Filled 2019-12-20 (×3): qty 50

## 2019-12-20 MED ORDER — PHENYLEPHRINE HCL (PRESSORS) 10 MG/ML IV SOLN
INTRAVENOUS | Status: DC | PRN
  Administered 2019-12-20: 100 ug via INTRAVENOUS
  Administered 2019-12-20: 200 ug via INTRAVENOUS

## 2019-12-20 MED ORDER — SODIUM CHLORIDE (PF) 0.9 % IJ SOLN
INTRAMUSCULAR | Status: AC
  Filled 2019-12-20: qty 10

## 2019-12-20 MED ORDER — TRANEXAMIC ACID-NACL 1000-0.7 MG/100ML-% IV SOLN
INTRAVENOUS | Status: AC
  Administered 2019-12-20: 18:00:00 1000 mg
  Filled 2019-12-20: qty 100

## 2019-12-20 MED ORDER — PROPOFOL 500 MG/50ML IV EMUL
INTRAVENOUS | Status: DC | PRN
  Administered 2019-12-20: 40 ug/kg/min via INTRAVENOUS

## 2019-12-20 MED ORDER — SODIUM CHLORIDE 0.9 % IV SOLN: INTRAVENOUS | Status: DC

## 2019-12-20 MED ORDER — SENNOSIDES-DOCUSATE SODIUM 8.6-50 MG PO TABS: 1.0000 | ORAL_TABLET | Freq: Every evening | ORAL | Status: DC | PRN

## 2019-12-20 MED ORDER — LACTATED RINGERS IV SOLN: INTRAVENOUS | Status: DC | PRN

## 2019-12-20 MED ORDER — CEFAZOLIN SODIUM 1 G IJ SOLR
INTRAMUSCULAR | Status: AC
  Filled 2019-12-20: qty 10

## 2019-12-20 MED ORDER — ACETAMINOPHEN 10 MG/ML IV SOLN
INTRAVENOUS | Status: DC | PRN
  Administered 2019-12-20: 1000 mg via INTRAVENOUS

## 2019-12-20 MED ORDER — FLEET ENEMA 7-19 GM/118ML RE ENEM: 1.0000 | ENEMA | Freq: Once | RECTAL | Status: DC | PRN

## 2019-12-20 MED ORDER — METHOCARBAMOL 500 MG PO TABS
500.0000 mg | ORAL_TABLET | Freq: Four times a day (QID) | ORAL | Status: DC | PRN
  Administered 2019-12-22 – 2019-12-24 (×5): 500 mg via ORAL
  Filled 2019-12-20 (×5): qty 1

## 2019-12-20 MED ORDER — ONDANSETRON HCL 4 MG/2ML IJ SOLN
INTRAMUSCULAR | Status: DC | PRN
  Administered 2019-12-20: 4 mg via INTRAVENOUS

## 2019-12-20 MED ORDER — ACETAMINOPHEN 500 MG PO TABS
1000.0000 mg | ORAL_TABLET | Freq: Three times a day (TID) | ORAL | Status: AC
  Administered 2019-12-20 – 2019-12-21 (×4): 1000 mg via ORAL
  Filled 2019-12-20 (×4): qty 2

## 2019-12-20 MED ORDER — PROPOFOL 500 MG/50ML IV EMUL
INTRAVENOUS | Status: AC
  Filled 2019-12-20: qty 50

## 2019-12-20 MED ORDER — CHLORHEXIDINE GLUCONATE CLOTH 2 % EX PADS
6.0000 | MEDICATED_PAD | Freq: Every day | CUTANEOUS | Status: DC
  Administered 2019-12-22 – 2019-12-24 (×3): 6 via TOPICAL

## 2019-12-20 MED ORDER — SODIUM CHLORIDE 0.9 % IV SOLN
INTRAVENOUS | Status: DC | PRN
  Administered 2019-12-20: 50 ug/min via INTRAVENOUS

## 2019-12-20 MED ORDER — EPINEPHRINE PF 1 MG/ML IJ SOLN
INTRAMUSCULAR | Status: AC
  Filled 2019-12-20: qty 1

## 2019-12-20 MED ORDER — BUPIVACAINE HCL (PF) 0.25 % IJ SOLN
INTRAMUSCULAR | Status: AC
  Filled 2019-12-20: qty 30

## 2019-12-20 MED ORDER — ONDANSETRON HCL 4 MG/2ML IJ SOLN
INTRAMUSCULAR | Status: AC
  Filled 2019-12-20: qty 2

## 2019-12-20 MED ORDER — ONDANSETRON HCL 4 MG/2ML IJ SOLN: 4.0000 mg | Freq: Four times a day (QID) | INTRAMUSCULAR | Status: DC | PRN

## 2019-12-20 MED ORDER — PHENYLEPHRINE HCL (PRESSORS) 10 MG/ML IV SOLN
INTRAVENOUS | Status: AC
  Filled 2019-12-20: qty 1

## 2019-12-20 MED ORDER — DOCUSATE SODIUM 100 MG PO CAPS
100.0000 mg | ORAL_CAPSULE | Freq: Two times a day (BID) | ORAL | Status: DC
  Administered 2019-12-20 – 2019-12-24 (×8): 100 mg via ORAL
  Filled 2019-12-20 (×8): qty 1

## 2019-12-20 MED ORDER — KETAMINE HCL 10 MG/ML IJ SOLN
INTRAMUSCULAR | Status: DC | PRN
  Administered 2019-12-20 (×2): 10 mg via INTRAVENOUS
  Administered 2019-12-20: 5 mg via INTRAVENOUS
  Administered 2019-12-20: 10 mg via INTRAVENOUS

## 2019-12-20 MED ORDER — TRANEXAMIC ACID-NACL 1000-0.7 MG/100ML-% IV SOLN
INTRAVENOUS | Status: DC | PRN
  Administered 2019-12-20: 1000 mg via INTRAVENOUS

## 2019-12-20 MED ORDER — DEXMEDETOMIDINE HCL IN NACL 80 MCG/20ML IV SOLN
INTRAVENOUS | Status: AC
  Filled 2019-12-20: qty 20

## 2019-12-20 MED ORDER — SODIUM CHLORIDE 0.9 % IR SOLN
Status: DC | PRN
  Administered 2019-12-20: 250 mL

## 2019-12-20 MED ORDER — OXYCODONE HCL 5 MG PO TABS
5.0000 mg | ORAL_TABLET | ORAL | Status: DC | PRN
  Administered 2019-12-23: 15:00:00 5 mg via ORAL
  Filled 2019-12-20: qty 1

## 2019-12-20 MED ORDER — KETAMINE HCL 50 MG/ML IJ SOLN
INTRAMUSCULAR | Status: AC
  Filled 2019-12-20: qty 10

## 2019-12-20 MED ORDER — BISACODYL 10 MG RE SUPP
10.0000 mg | Freq: Every day | RECTAL | Status: DC | PRN
  Administered 2019-12-22: 09:00:00 10 mg via RECTAL
  Filled 2019-12-20: qty 1

## 2019-12-20 MED ORDER — HYDRALAZINE HCL 20 MG/ML IJ SOLN
10.0000 mg | Freq: Four times a day (QID) | INTRAMUSCULAR | Status: DC | PRN
  Administered 2019-12-20: 12:00:00 10 mg via INTRAVENOUS
  Filled 2019-12-20 (×3): qty 0.5

## 2019-12-20 MED ORDER — BUPIVACAINE HCL (PF) 0.5 % IJ SOLN
INTRAMUSCULAR | Status: AC
  Filled 2019-12-20: qty 10

## 2019-12-20 MED ORDER — FENTANYL CITRATE (PF) 100 MCG/2ML IJ SOLN: 25.0000 ug | INTRAMUSCULAR | Status: DC | PRN

## 2019-12-20 MED ORDER — CEFAZOLIN SODIUM-DEXTROSE 1-4 GM/50ML-% IV SOLN
INTRAVENOUS | Status: DC | PRN
  Administered 2019-12-20: 1 g via INTRAVENOUS

## 2019-12-20 MED ORDER — METOCLOPRAMIDE HCL 10 MG PO TABS: 5.0000 mg | ORAL_TABLET | Freq: Three times a day (TID) | ORAL | Status: DC | PRN

## 2019-12-20 MED ORDER — ONDANSETRON HCL 4 MG PO TABS: 4.0000 mg | ORAL_TABLET | Freq: Four times a day (QID) | ORAL | Status: DC | PRN

## 2019-12-20 MED ORDER — TRANEXAMIC ACID-NACL 1000-0.7 MG/100ML-% IV SOLN
1000.0000 mg | Freq: Once | INTRAVENOUS | Status: DC
  Filled 2019-12-20: qty 100

## 2019-12-20 SURGICAL SUPPLY — 63 items
"PENCIL ELECTRO HAND CTR " (MISCELLANEOUS) ×1 IMPLANT
BLADE SAW 18WX90L 1.27 THK (BLADE) ×1 IMPLANT
BLADE SURG 15 STRL LF DISP TIS (BLADE) ×1 IMPLANT
BLADE SURG 15 STRL SS (BLADE) ×2
BOWL CEMENT MIX W/ADAPTER (MISCELLANEOUS) ×1 IMPLANT
CANISTER SUCT 1200ML W/VALVE (MISCELLANEOUS) ×2 IMPLANT
CEMENT BONE 1-PACK (Cement) ×2 IMPLANT
CEMENT RESTRICTOR DEPUY SZ 3 (Cement) ×1 IMPLANT
CHLORAPREP W/TINT 26 (MISCELLANEOUS) ×2 IMPLANT
COVER MAYO STAND REUSABLE (DRAPES) ×1 IMPLANT
COVER WAND RF STERILE (DRAPES) ×2 IMPLANT
DRAPE 3/4 80X56 (DRAPES) ×3 IMPLANT
DRAPE INCISE IOBAN 66X45 STRL (DRAPES) ×2 IMPLANT
DRAPE SURG 17X11 SM STRL (DRAPES) ×4 IMPLANT
DRAPE U-SHAPE 47X51 STRL (DRAPES) ×5 IMPLANT
DRSG OPSITE POSTOP 3X4 (GAUZE/BANDAGES/DRESSINGS) ×1 IMPLANT
DRSG OPSITE POSTOP 4X10 (GAUZE/BANDAGES/DRESSINGS) ×1 IMPLANT
ELECT REM PT RETURN 9FT ADLT (ELECTROSURGICAL) ×4
ELECTRODE REM PT RTRN 9FT ADLT (ELECTROSURGICAL) ×1 IMPLANT
GAUZE XEROFORM 1X8 LF (GAUZE/BANDAGES/DRESSINGS) ×2 IMPLANT
GLOVE BIOGEL PI IND STRL 8 (GLOVE) ×1 IMPLANT
GLOVE BIOGEL PI INDICATOR 8 (GLOVE) ×3
GLOVE SURG SYN 7.5  E (GLOVE) ×4
GLOVE SURG SYN 7.5 E (GLOVE) ×2 IMPLANT
GLOVE SURG SYN 7.5 PF PI (GLOVE) ×2 IMPLANT
GOWN STRL REUS W/ TWL LRG LVL3 (GOWN DISPOSABLE) ×1 IMPLANT
GOWN STRL REUS W/ TWL XL LVL3 (GOWN DISPOSABLE) ×1 IMPLANT
GOWN STRL REUS W/TWL LRG LVL3 (GOWN DISPOSABLE) ×4
GOWN STRL REUS W/TWL XL LVL3 (GOWN DISPOSABLE) ×2
HEAD MODULAR ENDO (Orthopedic Implant) ×2 IMPLANT
HEAD UNPLR 47XMDLR STRL HIP (Orthopedic Implant) IMPLANT
KIT TURNOVER CYSTO (KITS) ×2 IMPLANT
MAT ABSORB  FLUID 56X50 GRAY (MISCELLANEOUS) ×2
MAT ABSORB FLUID 56X50 GRAY (MISCELLANEOUS) ×2 IMPLANT
NDL FILTER BLUNT 18X1 1/2 (NEEDLE) ×1 IMPLANT
NDL MAYO 6 CRC TAPER PT (NEEDLE) IMPLANT
NEEDLE FILTER BLUNT 18X 1/2SAF (NEEDLE) ×1
NEEDLE FILTER BLUNT 18X1 1/2 (NEEDLE) ×1 IMPLANT
NEEDLE HYPO 22GX1.5 SAFETY (NEEDLE) ×2 IMPLANT
NEEDLE MAYO 6 CRC TAPER PT (NEEDLE) ×2 IMPLANT
NS IRRIG 500ML POUR BTL (IV SOLUTION) ×2 IMPLANT
PACK HIP COMPR (MISCELLANEOUS) ×2 IMPLANT
PACK HIP PROSTHESIS (MISCELLANEOUS) ×1 IMPLANT
PENCIL ELECTRO HAND CTR (MISCELLANEOUS) ×2 IMPLANT
PILLOW ABDUCTION MEDIUM (MISCELLANEOUS) ×1 IMPLANT
PRESSURIZER FEM CANAL M (MISCELLANEOUS) ×1 IMPLANT
PULSAVAC PLUS IRRIG FAN TIP (DISPOSABLE) ×2
SLEEVE UNITRAX V40 STD (Orthopedic Implant) ×1 IMPLANT
SPACER OSTEO CEMENT (Orthopedic Implant) ×2 IMPLANT
SPACER OSTEO CEMENT 12HIP (Orthopedic Implant) IMPLANT
STAPLER SKIN PROX 35W (STAPLE) ×2 IMPLANT
STEM HIP ACCOLADE SZ5 37X145 (Stem) ×1 IMPLANT
SUT ETHIBOND #5 BRAIDED 30INL (SUTURE) ×1 IMPLANT
SUT VIC AB 0 CT1 36 (SUTURE) ×3 IMPLANT
SUT VIC AB 2-0 CT1 27 (SUTURE) ×2
SUT VIC AB 2-0 CT1 TAPERPNT 27 (SUTURE) ×1 IMPLANT
SUT VIC AB 2-0 CT2 27 (SUTURE) ×2 IMPLANT
SYR 30ML LL (SYRINGE) ×2 IMPLANT
SYR 5ML LL (SYRINGE) ×2 IMPLANT
TAPE CLOTH 3X10 WHT NS LF (GAUZE/BANDAGES/DRESSINGS) ×2 IMPLANT
TIP BRUSH PULSAVAC PLUS 24.33 (MISCELLANEOUS) ×1 IMPLANT
TIP FAN IRRIG PULSAVAC PLUS (DISPOSABLE) IMPLANT
TOWER CARTRIDGE SMART MIX (DISPOSABLE) ×1 IMPLANT

## 2019-12-20 NOTE — H&P (Signed)
H&P reviewed. No significant changes noted.  

## 2019-12-20 NOTE — Anesthesia Procedure Notes (Addendum)
Spinal  Patient location during procedure: OR Start time: 12/20/2019 1:23 PM End time: 12/20/2019 1:33 PM Staffing Performed: resident/CRNA  Anesthesiologist: Piscitello, Precious Haws, MD Resident/CRNA: Lia Foyer, CRNA Preanesthetic Checklist Completed: patient identified, IV checked, site marked, risks and benefits discussed, surgical consent, monitors and equipment checked, pre-op evaluation and timeout performed Spinal Block Patient position: left lateral decubitus Prep: DuraPrep Patient monitoring: heart rate, cardiac monitor, continuous pulse ox and blood pressure Approach: midline Location: L3-4 Injection technique: single-shot Needle Needle type: Pencan  Needle gauge: 25 G Needle length: 9 cm Assessment Sensory level: T4

## 2019-12-20 NOTE — Transfer of Care (Signed)
Immediate Anesthesia Transfer of Care Note  Patient: Kaitlyn Church  Procedure(s) Performed: ARTHROPLASTY BIPOLAR HIP (HEMIARTHROPLASTY) (Right Hip)  Patient Location: PACU  Anesthesia Type:Spinal  Level of Consciousness: sedated  Airway & Oxygen Therapy: Patient connected to face mask oxygen  Post-op Assessment: Post -op Vital signs reviewed and stable  Post vital signs: stable  Last Vitals:  Vitals Value Taken Time  BP 109/61   Temp    Pulse 69 12/20/19 1642  Resp 0 12/20/19 1642  SpO2 100 % 12/20/19 1642  Vitals shown include unvalidated device data.  Last Pain:  Vitals:   12/20/19 1232  TempSrc: Temporal  PainSc:          Complications: No apparent anesthesia complications

## 2019-12-20 NOTE — Progress Notes (Signed)
[facility] NAME: Kaitlyn Church    MR#:  [id number]  DATE OF BIRTH:  08/01/1941  SUBJECTIVE:   Generalized body ache Dry mouth  Right hip pain REVIEW OF SYSTEMS:   Review of Systems  Constitutional: Negative for chills, fever and weight loss.  HENT: Negative for ear discharge, ear pain and nosebleeds.   Eyes: Negative for blurred vision, pain and discharge.  Respiratory: Negative for sputum production, shortness of breath, wheezing and stridor.   Cardiovascular: Negative for chest pain, palpitations, orthopnea and PND.  Gastrointestinal: Negative for abdominal pain, diarrhea, nausea and vomiting.  Genitourinary: Negative for frequency and urgency.  Musculoskeletal: Positive for back pain, falls, joint pain and myalgias.  Neurological: Positive for weakness. Negative for sensory change, speech change and focal weakness.  Psychiatric/Behavioral: Negative for depression and hallucinations. The patient is not nervous/anxious.    Tolerating Diet:npo Tolerating PT: pending  DRUG ALLERGIES:   Allergies  Allergen Reactions  . Lidocaine Other (See Comments)  . Other Other (See Comments)    darvocet. darvocet - HALLUCINATIONS  . Propoxyphene Other (See Comments)    Other Reaction: hallucinations, weird dreams  . Ascorbic Acid Other (See Comments)  . Ascorbic Acid Other (See Comments)  . Azithromycin Other (See Comments)    Other Reaction: sore, swollen tongue  . Basil Oil Other (See Comments)  . Calcium Other (See Comments)  . Celecoxib Other (See Comments)  . Cephalexin     REACTION: felt tired but tolerated  . Dexamethasone Other (See Comments)    Other Reaction: insomnia, chest spasms  . Doxycycline Hives  . Itraconazole Other (See Comments)  . Meperidine Hcl Other (See Comments)  . Mirabegron Other (See Comments)    intolerant  . Morphine Hives  . Morphine Sulfate   . Nitrofurantoin Other (See Comments)    Swollen  lip and tongue Blisters on tongue  . Origanum Oil Other (See Comments)  . Pantoprazole Sodium Other (See Comments)    REACTION: diarrhea REACTION: diarrhea  . Penicillins Other (See Comments)    Has patient had a PCN reaction causing immediate rash, facial/tongue/throat swelling, SOB or lightheadedness with hypotension: Unknown Has patient had a PCN reaction causing severe rash involving mucus membranes or skin necrosis: Unknown Has patient had a PCN reaction that required hospitalization: Unknown Has patient had a PCN reaction occurring within the last 10 years: Unknown If all of the above answers are "NO", then may proceed with Cephalosporin use.   Marland Kitchen Propoxyphene Hcl   . Sulfa Antibiotics Other (See Comments)    Swollen lip and tongue Blisters on tongue  . Sulfamethoxazole-Trimethoprim Other (See Comments)  . Thimerosal Other (See Comments)    Eye burning    VITALS:  Blood pressure (!) 170/77, pulse 84, temperature 98 F (36.7 C), temperature source Oral, resp. rate 16, height (S) 5\' 5"  (1.651 m), weight (S) 43 kg, SpO2 97 %.  PHYSICAL EXAMINATION:   Physical Exam  GENERAL:  79 y.o.-year-old patient lying in the bed with no acute distress. Thin, fraile EYES: Pupils equal, round, reactive to light and accommodation. No scleral icterus.   HEENT: Head atraumatic, normocephalic. Oropharynx and nasopharynx clear.  NECK:  Supple, no jugular venous distention. No thyroid enlargement, no tenderness.  LUNGS: Normal breath sounds bilaterally, no wheezing, rales, rhonchi. No use of accessory muscles of respiration.  CARDIOVASCULAR: S1, S2 normal. No murmurs, rubs, or gallops.  ABDOMEN: Soft, nontender, nondistended. Bowel sounds present. No organomegaly  or mass.  EXTREMITIES: No cyanosis, clubbing or edema b/l.    NEUROLOGIC: Cranial nerves II through XII are intact. No focal Motor or sensory deficits b/l.  Decreased ROM right LE. gen weakness+ with deconditioning PSYCHIATRIC:  patient  is alert and oriented x 3.  SKIN: No obvious rash, lesion, or ulcer.   LABORATORY PANEL:  CBC Recent Labs  Lab 12/19/19 1547  WBC 9.6  HGB 12.2  HCT 38.4  PLT 230    Chemistries  Recent Labs  Lab 12/19/19 1547  NA 138  K 3.9  CL 104  CO2 26  GLUCOSE 104*  BUN 19  CREATININE 0.60  CALCIUM 8.7*   Cardiac Enzymes No results for input(s): TROPONINI in the last 168 hours. RADIOLOGY:  CT Head Wo Contrast  Result Date: 12/19/2019 CLINICAL DATA:  Fall from wheelchair EXAM: CT HEAD WITHOUT CONTRAST CT CERVICAL SPINE WITHOUT CONTRAST TECHNIQUE: Multidetector CT imaging of the head and cervical spine was performed following the standard protocol without intravenous contrast. Multiplanar CT image reconstructions of the cervical spine were also generated. COMPARISON:  11/15/2019 FINDINGS: CT HEAD FINDINGS Brain: No evidence of acute infarction, hemorrhage, hydrocephalus, extra-axial collection or mass lesion/mass effect. Mild age related cerebral volume loss. Vascular: Atherosclerotic calcifications involving the large vessels of the skull base. No unexpected hyperdense vessel. Skull: Normal. Negative for fracture or focal lesion. Sinuses/Orbits: No acute finding. Other: Depressed bilateral nasal bone fractures, unchanged from prior. CT CERVICAL SPINE FINDINGS Alignment: Facet joints are aligned without traumatic listhesis. Dens and lateral masses are aligned. Straightening of the cervical lordosis, which may be positional. Skull base and vertebrae: No acute fracture. No primary bone lesion or focal pathologic process. Soft tissues and spinal canal: No prevertebral fluid or swelling. No visible canal hematoma. Disc levels: Similar multilevel intervertebral disc height loss and degenerative endplate changes. Relatively mild facet arthropathy. No interval progression from prior. Upper chest: Biapical pleuroparenchymal scarring. Lung apices otherwise clear. Other: None. IMPRESSION: 1. No acute  intracranial abnormality. 2. No evidence of acute fracture or traumatic listhesis of the cervical spine. 3. Mild multilevel cervical spondylosis, similar to prior. Electronically Signed   By: Davina Poke D.O.   On: 12/19/2019 12:28   CT Cervical Spine Wo Contrast  Result Date: 12/19/2019 CLINICAL DATA:  Fall from wheelchair EXAM: CT HEAD WITHOUT CONTRAST CT CERVICAL SPINE WITHOUT CONTRAST TECHNIQUE: Multidetector CT imaging of the head and cervical spine was performed following the standard protocol without intravenous contrast. Multiplanar CT image reconstructions of the cervical spine were also generated. COMPARISON:  11/15/2019 FINDINGS: CT HEAD FINDINGS Brain: No evidence of acute infarction, hemorrhage, hydrocephalus, extra-axial collection or mass lesion/mass effect. Mild age related cerebral volume loss. Vascular: Atherosclerotic calcifications involving the large vessels of the skull base. No unexpected hyperdense vessel. Skull: Normal. Negative for fracture or focal lesion. Sinuses/Orbits: No acute finding. Other: Depressed bilateral nasal bone fractures, unchanged from prior. CT CERVICAL SPINE FINDINGS Alignment: Facet joints are aligned without traumatic listhesis. Dens and lateral masses are aligned. Straightening of the cervical lordosis, which may be positional. Skull base and vertebrae: No acute fracture. No primary bone lesion or focal pathologic process. Soft tissues and spinal canal: No prevertebral fluid or swelling. No visible canal hematoma. Disc levels: Similar multilevel intervertebral disc height loss and degenerative endplate changes. Relatively mild facet arthropathy. No interval progression from prior. Upper chest: Biapical pleuroparenchymal scarring. Lung apices otherwise clear. Other: None. IMPRESSION: 1. No acute intracranial abnormality. 2. No evidence of acute fracture or traumatic listhesis  of the cervical spine. 3. Mild multilevel cervical spondylosis, similar to prior.  Electronically Signed   By: Davina Poke D.O.   On: 12/19/2019 12:28   CT Hip Right Wo Contrast  Result Date: 12/19/2019 CLINICAL DATA:  Fall, right hip pain EXAM: CT OF THE RIGHT HIP WITHOUT CONTRAST TECHNIQUE: Multidetector CT imaging of the right hip was performed according to the standard protocol. Multiplanar CT image reconstructions were also generated. COMPARISON:  None. FINDINGS: Bones/Joint/Cartilage Acute mildly impacted subcapital fracture of the proximal right femur. Mildly displaced 1.5 cm cortical fragment along the posteroinferior aspect of the fracture line (series 6, image 40; series 5, image 54). No definite extension to the femoral head articular surface or to the intertrochanteric region. Right hip joint alignment is maintained without dislocation. Acetabulum and visualized right hemipelvis is intact. Pubic symphysis and visualized right SI joint are intact without diastasis. Small right hip joint effusion. Ligaments Suboptimally assessed by CT. Muscles and Tendons No acute musculotendinous findings by CT. Soft tissues Mild induration within the lateral soft tissues. No focal fluid collection or hematoma. IMPRESSION: Acute mildly impacted subcapital fracture of the proximal right femur. Electronically Signed   By: Davina Poke D.O.   On: 12/19/2019 14:32   DG Hip Unilat With Pelvis 2-3 Views Right  Result Date: 12/19/2019 CLINICAL DATA:  [place] out of wheelchair, right-sided hip pain EXAM: DG HIP (WITH OR WITHOUT PELVIS) 2-3V RIGHT COMPARISON:  None. FINDINGS: Frontal view of the pelvis as well as frontal and cross-table lateral views of the right hip are obtained. There is an impacted subcapital right femoral neck fracture with resulting internal rotation of the right hip. No dislocation. Soft tissues are unremarkable. IMPRESSION: 1. Subcapital right femoral neck fracture. Electronically Signed   By: Randa Ngo M.D.   On: 12/19/2019 15:59   ASSESSMENT AND PLAN:   Kaitlyn Church  is a 79 y.o. female with a known history of Parkinsons disease is wheelchair-bound, anxiety/depression, esophageal motility disorder,GERD, fibromyalgia comes to the emergency room from after she had slid off her wheelchair and started having right-sided pain.  1.Right sub capital femoral neck fracture status post mechanical fall -patient has Parkinson's disease. She is wheelchair-bound. She had a mechanical fall  at [place] -IV fluids -PRN pain meds -orthopedic consultation with Dr. Posey Pronto-- plans for surgery today -NPO  -DVT prophylaxis after surgery, continue SCD for now -patient does not have history of CAD. Denies any chest pain shortness of breath. - pt is at a low-intermediate risk for surgery. She understands risks and benefits of it.  2. Parkinson's disease -cont sinemet -patient is wheelchair-bound. She follows with neurology at [facility]  3. Esophageal motility disorder -soft diet -consider ST if needed  4.Fibromylagia -resume home meds  5. DVT prophylaxis SCD for now till surgery is done  Family Communication : Consults : orthopedic Dr. Leim Fabry Code Status : DNR prior to admission. This was discussed with patient in the ER DVT prophylaxis :SCD Procedures: Family communication :left msg for friend Marzella Schlein   Status is: Inpatient Dispo: The patient is from: [place]              Anticipated d/c is to: TBD              Anticipated d/c date is: 1-3 days. Pt to get Hip surgery today. Depending on how she does with PT discharge will be to Rehab vs ALF with PT  Patient currently stable.   TOTAL TIME TAKING CARE OF THIS PATIENT: *30* minutes.  >50% time spent on counselling and coordination of care  Note: This dictation was prepared with Dragon dictation along with smaller phrase technology. Any transcriptional errors that result from this process are unintentional.  Fritzi Mandes M.D    [facility]   CC: Primary  care physician; Adin Hector, MDPatient ID: [name], female   DOB: 01-13-1941, 79 y.o.   MRN: [id number]

## 2019-12-20 NOTE — Anesthesia Preprocedure Evaluation (Signed)
Anesthesia Evaluation  Patient identified by MRN, date of birth, ID band Patient awake    Reviewed: Allergy & Precautions, H&P , NPO status , Patient's Chart, lab work & pertinent test results  History of Anesthesia Complications Negative for: history of anesthetic complications  Airway Mallampati: III  TM Distance: <3 FB Neck ROM: limited    Dental  (+) Chipped, Poor Dentition, Missing   Pulmonary neg shortness of breath, former smoker,           Cardiovascular (-) angina(-) Past MI negative cardio ROS       Neuro/Psych PSYCHIATRIC DISORDERS  Neuromuscular disease negative psych ROS   GI/Hepatic Neg liver ROS, GERD  Medicated and Controlled,  Endo/Other  negative endocrine ROS  Renal/GU      Musculoskeletal  (+) Arthritis , Fibromyalgia -  Abdominal   Peds  Hematology  (+) REFUSES BLOOD PRODUCTS, JEHOVAH'S WITNESS  Anesthesia Other Findings Past Medical History: No date: Abdominal pain, right upper quadrant No date: Allergic rhinitis No date: Anxiety No date: Depression No date: Esophageal motility disorder No date: Fibromyalgia No date: Gait disturbance No date: GERD (gastroesophageal reflux disease) No date: Grief reaction No date: Hemorrhoids, internal, thrombosed No date: Interstitial cystitis No date: Irritable bowel syndrome No date: Osteoporosis No date: Parkinson disease ([place]) No date: Restless leg syndrome No date: Shingles No date: Sjogren's syndrome ([place])     Comment:  ? No date: Trochanteric bursitis     Comment:  bilateral No date: Vaginitis  Past Surgical History: 10/2002: ABDOMINAL HYSTERECTOMY 11/1969: APPENDECTOMY 1980: CYSTOCELE REPAIR 01/2001: ESOPHAGOGASTRODUODENOSCOPY     Comment:  nl 09/2006: ESOPHAGOGASTRODUODENOSCOPY     Comment:  Normal Carlean Purl) 09/1969: [place]: HERNIA REPAIR 11/1976: NASAL SEPTUM SURGERY 03/1972: POLYPECTOMY     Comment:   Bladder 1980: [place]: TONSILLECTOMY  BMI    Body Mass Index: 15.78 kg/m      Reproductive/Obstetrics negative OB ROS                             Anesthesia Physical Anesthesia Plan  ASA: III  Anesthesia Plan: Spinal   Post-op Pain Management:    Induction:   PONV Risk Score and Plan:   Airway Management Planned: Natural Airway and Nasal Cannula  Additional Equipment:   Intra-op Plan:   Post-operative Plan:   Informed Consent: I have reviewed the patients History and Physical, chart, labs and discussed the procedure including the risks, benefits and alternatives for the proposed anesthesia with the patient or authorized representative who has indicated his/her understanding and acceptance.     Dental Advisory Given  Plan Discussed with: Anesthesiologist, CRNA and Surgeon  Anesthesia Plan Comments: (Both patient and daughter consented.  They both report no problems with lidocaine or any other local anesthetics.  They report no bleeding problems and no anticoagulant use.  Plan for spinal with backup GA  They were  consented for risks of anesthesia including but not limited to:  - adverse reactions to medications - damage to eyes, teeth, lips or other oral mucosa - nerve damage due to positioning  - risk of bleeding, infection, nerve damage and headache - risk of failed spinal - damage to teeth, lips or other oral mucosa - sore throat or hoarseness - damage to heart, brain, nerves, lungs or loss of life  They voiced understanding.)        Anesthesia Quick Evaluation

## 2019-12-20 NOTE — Plan of Care (Signed)
  Problem: Education: Goal: Knowledge of General Education information will improve Description: Including pain rating scale, medication(s)/side effects and non-pharmacologic comfort measures Outcome: Progressing   Problem: Health Behavior/Discharge Planning: Goal: Ability to manage health-related needs will improve Outcome: Not Progressing   Problem: Clinical Measurements: Goal: Ability to maintain clinical measurements within normal limits will improve Outcome: Progressing Goal: Will remain free from infection Outcome: Progressing Goal: Diagnostic test results will improve Outcome: Progressing Goal: Respiratory complications will improve Outcome: Progressing Goal: Cardiovascular complication will be avoided Outcome: Progressing   Problem: Clinical Measurements: Goal: Will remain free from infection Outcome: Progressing   Problem: Clinical Measurements: Goal: Diagnostic test results will improve Outcome: Progressing   Problem: Clinical Measurements: Goal: Respiratory complications will improve Outcome: Progressing   Problem: Clinical Measurements: Goal: Cardiovascular complication will be avoided Outcome: Progressing

## 2019-12-20 NOTE — Op Note (Signed)
DATE OF SURGERY: 12/20/2019  PREOPERATIVE DIAGNOSIS: Right femoral neck fracture  POSTOPERATIVE DIAGNOSIS: Right femoral neck fracture  PROCEDURE: Right hip hemiarthroplasty  SURGEON: Cato Mulligan, MD  ASSISTANTS: Benson Norway, PA-S  EBL: 150 cc  COMPONENTS:  Stryker - Accolade C Size 5 Stem Stryker - Unitrax 77mm head with neutral offset neck Stryker - Omnifit 64mm distal cement spacer Depuy - Size 3 Cement Restrictor   INDICATIONS: Kaitlyn Church is a 79 y.o. female who sustained a femoral neck fracture after a fall.  Preoperatively, we had discussed performing a percutaneous pinning given that the fracture was valgus impacted in the lateral radiographs appear to show minimal displacement.  We did discuss that she would may be at high risk given that there was more displacement and some retroversion of the femoral head than standard valgus impacted femoral neck fracture.  We also discussed possibility of hemiarthroplasty and while the patient would have been amenable to this, she preferred to have a less invasive option with lower blood loss given that she was a Jehovah's Witness.  Risks and benefits of both percutaneous hip pinning and hip hemiarthroplasty were explained to the patient and/or family. Risks include but are not limited to bleeding, infection, injury to tissues, nerves, vessels, periprosthetic infection, dislocation, limb length discrepancy, avascular necrosis, malunion/nonunion, and risks of anesthesia. The patient and/or family understands these risks, has completed an informed consent and wishes to proceed.   PROCEDURE:  The patient was identified in the preoperative holding area and the operative extremity was marked.  The patient was then transferred to the operating room suite and mobilized from the hospital gurney to the operating room table. Spinal anesthesia was administered without complication.  The patient was then transferred onto the [facility] table and  radiographs were obtained.  However, at this point, it was noted that there was now a displaced femoral neck fracture.  Decision was made to then convert to a hip hemiarthroplasty.  Informed consent was obtained from the patient's power of attorney.  The patient was then transitioned to a lateral position on a standard bed on a pegboard.  All bony prominences were padded per protocol.  An axillary roll was placed.  Careful attention was paid to the contralateral side peroneal nerve, which was free from pressure with use of appropriate padding and blankets. A time-out was performed to confirm the patient's identity and the correct laterality of surgery. The patient was then prepped and draped in the usual sterile fashion. Appropriate pre-operative antibiotics were administered. Tranexamic acid was administered preoperatively.    An incision that centered on the posterior tip of the greater trochanter with a posterior curve was made. Dissection was carried down through the subcutaneous tissue.  Careful attention was made to maintain hemostasis using electrocautery.  Dissection brought [place] to the level of the deep fascia where the gluteus maximus muscle and proximal portion of the IT band were identified.  The proximal region of the IT band was incised in linear fashion and this incision was extended proximally in a curvilinear fashion to split the gluteus maximus muscle parallel to its fibers to minimize bleeding.  This was accomplished using a combination bovie electrocautery as well as blunt dissection.  The trochanteric bursa was then visualized and dissected from anterior to posterior. A blunt homan retractor was placed beneath the abductors. The piriformis tendon and short external rotators were visualized. Bovie electrocautery was used to cut these with the capsule as one L-shaped flap. This was tagged at  the corner with #5 Ethibond. At this point, the femoral neck fracture was visualized. An oscillating saw  was used to make a new neck cut approximately 87mm about the lesser tuberosity with the use of a neck cut guide. The head was then freed from its remaining soft tissue attachments and measured. The head trial was then inserted into the acetabulum and sized appropriately.    We then turned our attention to preparing the femoral canal. First, a box cut was performed utilizing the box osteotome. A canal finder was inserted by hand and sequential broaching was then performed. The calcar planer was inserted onto the broach and used to smooth the calcar appropriately.  A trial stem, neutral neck, and head were inserted into the acetabulum and placed through range of motion. Intraoperative radiographs were taken to assess hardware position and leg lengths. The hip was again dislocated and the femoral trial components were removed. An appropriately sized cement restrictor was placed. The canal was irrigated with pulse lavage and dried. A cement gun was used to place cement into the femoral canal. Cement was then pressurized. The actual stem was inserted into the femoral canal and then driven onto the calcar. Position was held until the cement hardened. The trial head was then again inserted on the femoral component and found to be appropriate. Trial was removed and the permanent head/neck was Morse tapered onto the femoral stem and then reduced into the acetabulum.    The hip stability and length were reassessed and found to be satisfactory.  The wound was then copiously irrigated with normal saline solution. The tagged sutures of the capsule and piriformis were sewn to the gluteus medius tendon. This adequately closed the hip capsule. The IT band and gluteus maximus fascia were then closed with 0-Vicryl in a running, locked fashion. A mixture of Exparil and Sensoricaine was administered.  The subdermal layer was closed with 2-0 Vicryl in a buried interrupted fashion. Skin was approximated with staples.  The wound was  then dressed with Xeroform and Honeycomb dressing.  An abduction pillow was placed. The patient was mobilized from the lateral position back to supine on the operating room table and then awakened from general anesthesia without complication.  Repeat dose of tranexamic acid will be administered in PACU.   POSTOPERATIVE PLAN: The patient will be WBAT on operative extremity. Lovenox 40mg /day x 4 weeks to start on POD#1. Ancef x 24 hours. PT/OT on POD#1. Posterior hip precautions.

## 2019-12-21 ENCOUNTER — Inpatient Hospital Stay: Payer: PPO

## 2019-12-21 DIAGNOSIS — Z96649 Presence of unspecified artificial hip joint: Secondary | ICD-10-CM

## 2019-12-21 LAB — CBC
HCT: 30.5 % — ABNORMAL LOW (ref 36.0–46.0)
Hemoglobin: 9.8 g/dL — ABNORMAL LOW (ref 12.0–15.0)
MCH: 30.1 pg (ref 26.0–34.0)
MCHC: 32.1 g/dL (ref 30.0–36.0)
MCV: 93.6 fL (ref 80.0–100.0)
Platelets: 177 10*3/uL (ref 150–400)
RBC: 3.26 MIL/uL — ABNORMAL LOW (ref 3.87–5.11)
RDW: 12.9 % (ref 11.5–15.5)
WBC: 6.9 10*3/uL (ref 4.0–10.5)
nRBC: 0 % (ref 0.0–0.2)

## 2019-12-21 LAB — BASIC METABOLIC PANEL
Anion gap: 8 (ref 5–15)
BUN: 12 mg/dL (ref 8–23)
CO2: 25 mmol/L (ref 22–32)
Calcium: 8.1 mg/dL — ABNORMAL LOW (ref 8.9–10.3)
Chloride: 105 mmol/L (ref 98–111)
Creatinine, Ser: 0.55 mg/dL (ref 0.44–1.00)
GFR calc Af Amer: >60 mL/min (ref >60)
GFR calc non Af Amer: >60 mL/min (ref >60)
Glucose, Bld: 108 mg/dL — ABNORMAL HIGH (ref 70–99)
Potassium: 3.5 mmol/L (ref 3.5–5.1)
Sodium: 138 mmol/L (ref 135–145)

## 2019-12-21 MED ORDER — POLYVINYL ALCOHOL 1.4 % OP SOLN
1.0000 [drp] | OPHTHALMIC | Status: DC | PRN
  Filled 2019-12-21: qty 15

## 2019-12-21 MED ORDER — ENOXAPARIN SODIUM 30 MG/0.3ML ~~LOC~~ SOLN
30.0000 mg | SUBCUTANEOUS | Status: DC
  Administered 2019-12-22 – 2019-12-24 (×3): 30 mg via SUBCUTANEOUS
  Filled 2019-12-21 (×3): qty 0.3

## 2019-12-21 MED ORDER — OXYCODONE HCL 5 MG PO TABS: 2.5000 mg | ORAL_TABLET | ORAL | 0 refills | Status: DC | PRN

## 2019-12-21 MED ORDER — TRAMADOL HCL 50 MG PO TABS: 50.0000 mg | ORAL_TABLET | Freq: Four times a day (QID) | ORAL | 0 refills | Status: DC | PRN

## 2019-12-21 MED ORDER — ENOXAPARIN SODIUM 40 MG/0.4ML ~~LOC~~ SOLN: 40.0000 mg | SUBCUTANEOUS | 0 refills | Status: AC

## 2019-12-21 NOTE — Anesthesia Postprocedure Evaluation (Signed)
Anesthesia Post Note  Patient: Kaitlyn Church  Procedure(s) Performed: ARTHROPLASTY BIPOLAR HIP (HEMIARTHROPLASTY) (Right Hip)  Patient location during evaluation: Nursing Unit Anesthesia Type: Spinal Level of consciousness: awake Pain control: c/o pressure in her back, no surgical pain. Vital Signs Assessment: post-procedure vital signs reviewed and stable Respiratory status: spontaneous breathing and respiratory function stable Cardiovascular status: blood pressure returned to baseline Postop Assessment: no headache Anesthetic complications: no Comments: Asked me to call '[employer]' for something for her back pressure.  Deferred to the RN.  Apparently, she lives at [facility].  Unsure if she is confused or just trying to get relief for her back upon leaving the hospital.  Physically, she moves both feet and legs and follows commands.     Last Vitals:  Vitals:   12/20/19 2204 12/21/19 0506  BP: (!) 123/48 (!) 131/47  Pulse: 83 90  Resp: 18 16  Temp: 36.5 C 36.9 C  SpO2: 96% 95%    Last Pain:  Vitals:   12/21/19 0506  TempSrc: Oral  PainSc:                  Buckner [place]

## 2019-12-21 NOTE — Progress Notes (Signed)
  Subjective: 1 Day Post-Op Procedure(s) (LRB): ARTHROPLASTY BIPOLAR HIP (HEMIARTHROPLASTY) (Right) Patient reports pain as moderate.   Patient is well, and has had no acute complaints or problems Plan is to go Rehab after hospital stay. Negative for chest pain and shortness of breath Fever: no Gastrointestinal: Negative for nausea and vomiting  Objective: Vital signs in last 24 hours: Temp:  [97.2 F (36.2 C)-98.7 F (37.1 C)] 98.4 F (36.9 C) (04/23 0506) Pulse Rate:  [69-91] 90 (04/23 0506) Resp:  [12-18] 16 (04/23 0506) BP: (109-170)/(47-77) 131/47 (04/23 0506) SpO2:  [95 %-100 %] 95 % (04/23 0506)  Intake/Output from previous day:  Intake/Output Summary (Last 24 hours) at 12/21/2019 0703 Last data filed at 12/21/2019 0500 Gross per 24 hour  Intake 2696.4 ml  Output 1700 ml  Net 996.4 ml    Intake/Output this shift: No intake/output data recorded.  Labs: Recent Labs    12/19/19 1547 12/20/19 1845 12/21/19 0312  HGB 12.2 11.2* 9.8*   Recent Labs    12/20/19 1845 12/21/19 0312  WBC 8.8 6.9  RBC 3.72* 3.26*  HCT 34.8* 30.5*  PLT 195 177   Recent Labs    12/19/19 1547 12/21/19 0312  NA 138 138  K 3.9 3.5  CL 104 105  CO2 26 25  BUN 19 12  CREATININE 0.60 0.55  GLUCOSE 104* 108*  CALCIUM 8.7* 8.1*   Recent Labs    12/19/19 1547  INR 0.9     EXAM General - Patient is Alert and Oriented.  Communicating well and asking questions. Extremity - Neurovascular intact Sensation intact distally Dorsiflexion/Plantar flexion intact Compartment soft Dressing/Incision - clean, dry, scant drainage Motor Function - intact, moving foot and toes well on exam.   Past Medical History:  Diagnosis Date  . Abdominal pain, right upper quadrant   . Allergic rhinitis   . Anxiety   . Depression   . Esophageal motility disorder   . Fibromyalgia   . Gait disturbance   . GERD (gastroesophageal reflux disease)   . Grief reaction   . Hemorrhoids, internal,  thrombosed   . Interstitial cystitis   . Irritable bowel syndrome   . Osteoporosis   . Parkinson disease ([place])   . Restless leg syndrome   . Shingles   . Sjogren's syndrome ([place])    ?  Marland Kitchen Trochanteric bursitis    bilateral  . Vaginitis     Assessment/Plan: 1 Day Post-Op Procedure(s) (LRB): ARTHROPLASTY BIPOLAR HIP (HEMIARTHROPLASTY) (Right) Active Problems:   Esophageal motility disorder   Closed right hip fracture, initial encounter (HCC)  Estimated body mass index is 15.78 kg/m as calculated from the following:   Height as of this encounter: 5\' 5"  (1.651 m).   Weight as of this encounter: 43 kg. Advance diet Up with therapy D/C IV fluids  Follow-up at [facility] orthopedics in 2 weeks for staple removal and x-rays of the right hip  DVT Prophylaxis - Lovenox, Foot Pumps and TED hose Weight-Bearing as tolerated to right leg  Reche Dixon, PA-C Orthopaedic Surgery 12/21/2019, 7:03 AM

## 2019-12-21 NOTE — Care Management Important Message (Signed)
Important Message  Patient Details  Name: Kaitlyn Church MRN: [id number] Date of Birth: 12-17-1940   Medicare Important Message Given:  N/A - LOS <3 / Initial given by admissions     Juliann Pulse A Epimenio Schetter 12/21/2019, 12:02 PM

## 2019-12-21 NOTE — Progress Notes (Signed)
[facility] [place] NAME: Kaitlyn Church    MR#:  [id number]  DATE OF BIRTH:  09/05/1940  SUBJECTIVE:  Did well for surgery Some hip pain Generalized body ache  REVIEW OF SYSTEMS:   Review of Systems  Constitutional: Negative for chills, fever and weight loss.  HENT: Negative for ear discharge, ear pain and nosebleeds.   Eyes: Negative for blurred vision, pain and discharge.  Respiratory: Negative for sputum production, shortness of breath, wheezing and stridor.   Cardiovascular: Negative for chest pain, palpitations, orthopnea and PND.  Gastrointestinal: Negative for abdominal pain, diarrhea, nausea and vomiting.  Genitourinary: Negative for frequency and urgency.  Musculoskeletal: Positive for back pain, falls, joint pain and myalgias.  Neurological: Positive for weakness. Negative for sensory change, speech change and focal weakness.  Psychiatric/Behavioral: Negative for depression and hallucinations. The patient is not nervous/anxious.    Tolerating Diet:soft diet Tolerating PT: pending  DRUG ALLERGIES:   Allergies  Allergen Reactions  . Lidocaine Other (See Comments)  . Other Other (See Comments)    darvocet. darvocet - HALLUCINATIONS  . Propoxyphene Other (See Comments)    Other Reaction: hallucinations, weird dreams  . Ascorbic Acid Other (See Comments)  . Ascorbic Acid Other (See Comments)  . Azithromycin Other (See Comments)    Other Reaction: sore, swollen tongue  . Basil Oil Other (See Comments)  . Calcium Other (See Comments)  . Celecoxib Other (See Comments)  . Cephalexin     REACTION: felt tired but tolerated  . Dexamethasone Other (See Comments)    Other Reaction: insomnia, chest spasms  . Doxycycline Hives  . Itraconazole Other (See Comments)  . Meperidine Hcl Other (See Comments)  . Mirabegron Other (See Comments)    intolerant  . Morphine Hives  . Morphine Sulfate   . Nitrofurantoin Other (See  Comments)    Swollen lip and tongue Blisters on tongue  . [place]um Oil Other (See Comments)  . Pantoprazole Sodium Other (See Comments)    REACTION: diarrhea REACTION: diarrhea  . Penicillins Other (See Comments)    Has patient had a PCN reaction causing immediate rash, facial/tongue/throat swelling, SOB or lightheadedness with hypotension: Unknown Has patient had a PCN reaction causing severe rash involving mucus membranes or skin necrosis: Unknown Has patient had a PCN reaction that required hospitalization: Unknown Has patient had a PCN reaction occurring within the last 10 years: Unknown If all of the above answers are "NO", then may proceed with Cephalosporin use.   Marland Kitchen Propoxyphene Hcl   . Sulfa Antibiotics Other (See Comments)    Swollen lip and tongue Blisters on tongue  . Sulfamethoxazole-Trimethoprim Other (See Comments)  . Thimerosal Other (See Comments)    Eye burning    VITALS:  Blood pressure (!) 129/50, pulse 96, temperature 99 F (37.2 C), temperature source Oral, resp. rate 16, height (S) 5\' 5"  (1.651 m), weight (S) 43 kg, SpO2 95 %.  PHYSICAL EXAMINATION:   Physical Exam  GENERAL:  79 y.o.-year-old patient lying in the bed with no acute distress. Thin, fraile EYES: Pupils equal, round, reactive to light and accommodation. No scleral icterus.   HEENT: Head atraumatic, normocephalic. Oropharynx and nasopharynx clear.  NECK:  Supple, no jugular venous distention. No thyroid enlargement, no tenderness.  LUNGS: Normal breath sounds bilaterally, no wheezing, rales, rhonchi. No use of accessory muscles of respiration.  CARDIOVASCULAR: S1, S2 normal. No murmurs, rubs, or gallops.  ABDOMEN: Soft, nontender, nondistended. Bowel sounds present.  No organomegaly or mass.  EXTREMITIES: No cyanosis, clubbing or edema b/l.   Right hip surgical dressing_ NEUROLOGIC: Cranial nerves II through XII are intact. No focal Motor or sensory deficits b/l.  Decreased ROM right LE. gen  weakness+ with deconditioning PSYCHIATRIC:  patient is alert and oriented x 3.  SKIN: No obvious rash, lesion, or ulcer.   LABORATORY PANEL:  CBC Recent Labs  Lab 12/21/19 0312  WBC 6.9  HGB 9.8*  HCT 30.5*  PLT 177    Chemistries  Recent Labs  Lab 12/21/19 0312  NA 138  K 3.5  CL 105  CO2 25  GLUCOSE 108*  BUN 12  CREATININE 0.55  CALCIUM 8.1*   Cardiac Enzymes No results for input(s): TROPONINI in the last 168 hours. RADIOLOGY:  DG Pelvis 1-2 Views  Result Date: 12/20/2019 CLINICAL DATA:  Postoperative radiograph, right hip hemiarthroplasty EXAM: PELVIS - 1-2 VIEW COMPARISON:  Intraoperative radiograph the same day, preoperative radiograph 12/19/2019 FINDINGS: Interval resection of the right femoral head neck with placement of the articulating right femoral stem of a right hip hemiarthroplasty. Expected postsurgical soft tissue changes are noted with overlying skin staples, soft tissue and intra-articular gas. Alignment is within expected normals. No acute complication is evident. IMPRESSION: Expected postoperative appearance following right hip hemiarthroplasty. Electronically Signed   By: Lovena Le M.D.   On: 12/20/2019 17:40   CT Head Wo Contrast  Result Date: 12/19/2019 CLINICAL DATA:  Fall from wheelchair EXAM: CT HEAD WITHOUT CONTRAST CT CERVICAL SPINE WITHOUT CONTRAST TECHNIQUE: Multidetector CT imaging of the head and cervical spine was performed following the standard protocol without intravenous contrast. Multiplanar CT image reconstructions of the cervical spine were also generated. COMPARISON:  11/15/2019 FINDINGS: CT HEAD FINDINGS Brain: No evidence of acute infarction, hemorrhage, hydrocephalus, extra-axial collection or mass lesion/mass effect. Mild age related cerebral volume loss. Vascular: Atherosclerotic calcifications involving the large vessels of the skull base. No unexpected hyperdense vessel. Skull: Normal. Negative for fracture or focal lesion.  Sinuses/Orbits: No acute finding. Other: Depressed bilateral nasal bone fractures, unchanged from prior. CT CERVICAL SPINE FINDINGS Alignment: Facet joints are aligned without traumatic listhesis. Dens and lateral masses are aligned. Straightening of the cervical lordosis, which may be positional. Skull base and vertebrae: No acute fracture. No primary bone lesion or focal pathologic process. Soft tissues and spinal canal: No prevertebral fluid or swelling. No visible canal hematoma. Disc levels: Similar multilevel intervertebral disc height loss and degenerative endplate changes. Relatively mild facet arthropathy. No interval progression from prior. Upper chest: Biapical pleuroparenchymal scarring. Lung apices otherwise clear. Other: None. IMPRESSION: 1. No acute intracranial abnormality. 2. No evidence of acute fracture or traumatic listhesis of the cervical spine. 3. Mild multilevel cervical spondylosis, similar to prior. Electronically Signed   By: Davina Poke D.O.   On: 12/19/2019 12:28   CT Cervical Spine Wo Contrast  Result Date: 12/19/2019 CLINICAL DATA:  Fall from wheelchair EXAM: CT HEAD WITHOUT CONTRAST CT CERVICAL SPINE WITHOUT CONTRAST TECHNIQUE: Multidetector CT imaging of the head and cervical spine was performed following the standard protocol without intravenous contrast. Multiplanar CT image reconstructions of the cervical spine were also generated. COMPARISON:  11/15/2019 FINDINGS: CT HEAD FINDINGS Brain: No evidence of acute infarction, hemorrhage, hydrocephalus, extra-axial collection or mass lesion/mass effect. Mild age related cerebral volume loss. Vascular: Atherosclerotic calcifications involving the large vessels of the skull base. No unexpected hyperdense vessel. Skull: Normal. Negative for fracture or focal lesion. Sinuses/Orbits: No acute finding. Other: Depressed bilateral nasal  bone fractures, unchanged from prior. CT CERVICAL SPINE FINDINGS Alignment: Facet joints are aligned  without traumatic listhesis. Dens and lateral masses are aligned. Straightening of the cervical lordosis, which may be positional. Skull base and vertebrae: No acute fracture. No primary bone lesion or focal pathologic process. Soft tissues and spinal canal: No prevertebral fluid or swelling. No visible canal hematoma. Disc levels: Similar multilevel intervertebral disc height loss and degenerative endplate changes. Relatively mild facet arthropathy. No interval progression from prior. Upper chest: Biapical pleuroparenchymal scarring. Lung apices otherwise clear. Other: None. IMPRESSION: 1. No acute intracranial abnormality. 2. No evidence of acute fracture or traumatic listhesis of the cervical spine. 3. Mild multilevel cervical spondylosis, similar to prior. Electronically Signed   By: Davina Poke D.O.   On: 12/19/2019 12:28   DG Pelvis Portable  Result Date: 12/20/2019 CLINICAL DATA:  Intraoperative fluoroscopy, right hip hemiarthroplasty EXAM: PORTABLE PELVIS 1-2 VIEWS COMPARISON:  Radiograph 07/07/2017 FINDINGS: Intraoperative fluoroscopic image includes portions of the lower pelvis and proximal femora. Images depict interval resection of the right femoral head and neck with placement of a metallic femoral stem prior to the installation of the articular component. Expected operative soft tissue changes are noted with swelling and intra-articular gas. IMPRESSION: Intraoperative fluoroscopic image during right hip hemiarthroplasty without acute complication. Electronically Signed   By: Lovena Le M.D.   On: 12/20/2019 17:38   CT Hip Right Wo Contrast  Result Date: 12/19/2019 CLINICAL DATA:  Fall, right hip pain EXAM: CT OF THE RIGHT HIP WITHOUT CONTRAST TECHNIQUE: Multidetector CT imaging of the right hip was performed according to the standard protocol. Multiplanar CT image reconstructions were also generated. COMPARISON:  None. FINDINGS: Bones/Joint/Cartilage Acute mildly impacted subcapital  fracture of the proximal right femur. Mildly displaced 1.5 cm cortical fragment along the posteroinferior aspect of the fracture line (series 6, image 40; series 5, image 54). No definite extension to the femoral head articular surface or to the intertrochanteric region. Right hip joint alignment is maintained without dislocation. Acetabulum and visualized right hemipelvis is intact. Pubic symphysis and visualized right SI joint are intact without diastasis. Small right hip joint effusion. Ligaments Suboptimally assessed by CT. Muscles and Tendons No acute musculotendinous findings by CT. Soft tissues Mild induration within the lateral soft tissues. No focal fluid collection or hematoma. IMPRESSION: Acute mildly impacted subcapital fracture of the proximal right femur. Electronically Signed   By: Davina Poke D.O.   On: 12/19/2019 14:32   DG HIP OPERATIVE UNILAT W OR W/O PELVIS RIGHT  Result Date: 12/20/2019 CLINICAL DATA:  Right hip fracture EXAM: OPERATIVE RIGHT HIP (WITH PELVIS IF PERFORMED) 2 VIEWS TECHNIQUE: Fluoroscopic spot image(s) were submitted for interpretation post-operatively. COMPARISON:  12/19/2019 FINDINGS: Two fluoroscopic images are obtained during the performance of the procedure and are provided for interpretation only. The subcapital right femoral neck fracture seen previously demonstrates significant interval displacement with superior migration of the distal fracture fragment. Femoral head remains anatomically aligned with the acetabulum. FLUOROSCOPY TIME:  2 second IMPRESSION: 1. Intraoperative exam as above. Electronically Signed   By: Randa Ngo M.D.   On: 12/20/2019 16:09   DG Hip Unilat With Pelvis 2-3 Views Right  Result Date: 12/19/2019 CLINICAL DATA:  [place] out of wheelchair, right-sided hip pain EXAM: DG HIP (WITH OR WITHOUT PELVIS) 2-3V RIGHT COMPARISON:  None. FINDINGS: Frontal view of the pelvis as well as frontal and cross-table lateral views of the right hip are  obtained. There is an impacted subcapital right femoral  neck fracture with resulting internal rotation of the right hip. No dislocation. Soft tissues are unremarkable. IMPRESSION: 1. Subcapital right femoral neck fracture. Electronically Signed   By: Randa Ngo M.D.   On: 12/19/2019 15:59   ASSESSMENT AND PLAN:   Kaitlyn Church  is a 79 y.o. female with a known history of Parkinsons disease is wheelchair-bound, anxiety/depression, esophageal motility disorder,GERD, fibromyalgia comes to the emergency room from after she had slid off her wheelchair and started having right-sided pain.  1.Right sub capital femoral neck fracture status post mechanical fall -patient has Parkinson's disease. She is wheelchair-bound. She had a mechanical fall  at [place] -PRN pain meds -orthopedic consultation with Dr. Posey Pronto- patient is status post right hip hemi arthroplasty POD1 -patient does not have history of CAD. Denies any chest pain shortness of breath. -PT to be initiated. -Remove Foley catheter today  after physical therapy  2. Parkinson's disease -cont sinemet -patient is wheelchair-bound however does short steps with help - She follows with neurology at [facility], [place]  3. Esophageal motility disorder -soft diet -consider ST if needed  4.Fibromylagia -resume home meds  5. DVT prophylaxis SCD for now till surgery is done  Family Communication :let msg for [employer] yday (friend) Consults : orthopedic Dr. Leim Fabry Code Status : DNR prior to admission. This was discussed with patient in the ER DVT prophylaxis :SCD Procedures: right hip hemi arthroplasty on 4/22   Status is: Inpatient Dispo: The patient is from: [place]              Anticipated d/c is to: TBD pending PT              Anticipated d/c date is: 1-3 days.  Depending on how she does with PT discharge will be to Rehab vs ALF with PT. TOC consulted              Patient currently stable.   TOTAL TIME TAKING CARE  OF THIS PATIENT: *30* minutes.  >50% time spent on counselling and coordination of care  Note: This dictation was prepared with Dragon dictation along with smaller phrase technology. Any transcriptional errors that result from this process are unintentional.  Fritzi Mandes M.D    [facility]   CC: Primary care physician; Kaitlyn Church, MDPatient ID: Kaitlyn Church, female   DOB: 02-21-1941, 79 y.o.   MRN: [id number]

## 2019-12-21 NOTE — NC FL2 (Signed)
[place] LEVEL OF CARE SCREENING TOOL     IDENTIFICATION  Patient Name: Kaitlyn Church Birthdate: [birth year] Sex: female Admission Date (Current Location): 12/19/2019  [facility] and [place] Number:  Engineering geologist and Address:  [facility], [address] [place], [place] [address]      Provider Number: [id number]  Attending Physician Name and Address:  Fritzi Mandes, MD  Relative Name and Phone Number:       Current Level of Care: Hospital Recommended Level of Care: [place] Prior Approval Number:    Date Approved/Denied:   PASRR Number:    Discharge Plan: SNF    Current Diagnoses: Patient Active Problem List   Diagnosis Date Noted  . S/P hip hemiarthroplasty   . Closed right hip fracture, initial encounter ([place]) 12/19/2019  . Fall   . Arthritis 06/01/2016  . B12 deficiency 06/01/2016  . Osteoporosis, post-menopausal 06/01/2016  . Parkinson disease ([place]) 06/01/2016  . Sjogren's syndrome ([place]) 06/01/2016  . Chronic cystitis 06/01/2016  . Dysuria 04/16/2016  . Recurrent major depressive disorder, in partial remission ([place]) 01/07/2016  . Chronic constipation 09/24/2014  . Dysphagia 09/24/2014  . Raynaud's phenomenon without gangrene 09/24/2014  . Chalazion 04/20/2013  . Bilateral dry eyes 03/06/2012  . Blepharitis of both eyes 03/06/2012  . MGD (meibomian gland dysfunction) 03/06/2012  . DYSPHAGIA PHARYNGEAL PHASE 07/21/2009  . Esophageal motility disorder 02/03/2009  . FIBROMYALGIA 02/03/2009  . TROCHANTERIC BURSITIS, BILATERAL 06/21/2008  . HAND PAIN 06/21/2008  . GAIT DISTURBANCE 06/21/2008  . ABDOMINAL, RIGHT UPPER, PAIN 06/21/2008  . GRIEF REACTION 05/30/2008  . VAGINITIS 05/30/2008  . OSTEOPOROSIS 05/30/2008  . HEMORRHOIDS, INTERNAL, THROMBOSED 03/22/2008  . CHANGE IN BOWELS 03/22/2008  . FATIGUE 03/10/2007  . RESTLESS LEG SYNDROME 02/19/2007  . Irritable bowel syndrome 02/19/2007  .  ANXIETY 02/16/2007  . DEPRESSION 02/16/2007  . ALLERGIC RHINITIS 02/16/2007  . GERD 02/16/2007  . INTERSTITIAL CYSTITIS 02/16/2007  . [place] SYNDROME 02/16/2007  . FIBROSITIS 02/16/2007    Orientation RESPIRATION BLADDER Height & Weight     Self, Time, Situation, Place  Normal External catheter Weight: (S) 43 kg Height:  (S) 5\' 5"  (165.1 cm)  BEHAVIORAL SYMPTOMS/MOOD NEUROLOGICAL BOWEL NUTRITION STATUS      Continent Diet(Regular)  AMBULATORY STATUS COMMUNICATION OF NEEDS Skin   Extensive Assist Verbally Surgical wounds                       Personal Care Assistance Level of Assistance  Bathing, Dressing Bathing Assistance: Limited assistance   Dressing Assistance: Limited assistance     Functional Limitations Info  Speech     Speech Info: Impaired(speaks very softly)    SPECIAL CARE FACTORS FREQUENCY  PT (By licensed PT), OT (By licensed OT)                    Contractures Contractures Info: Not present    Additional Factors Info  Code Status, Allergies Code Status Info: Full             Current Medications (12/21/2019):  This is the current hospital active medication list Current Facility-Administered Medications  Medication Dose Route Frequency Provider Last Rate Last Admin  . 0.9 %  sodium chloride infusion   Intravenous Continuous Leim Fabry, MD 75 mL/hr at 12/21/19 0647 New Bag at 12/21/19 [id number]  . acetaminophen (TYLENOL) tablet 1,000 mg  1,000 mg Oral Q8H Leim Fabry, MD   1,000 mg at  12/21/19 0939  . bisacodyl (DULCOLAX) suppository 10 mg  10 mg Rectal Daily PRN Leim Fabry, MD      . carbidopa-levodopa (SINEMET IR) 25-100 MG per tablet immediate release 1.5 tablet  1.5 tablet Oral 5 X Daily Leim Fabry, MD   1.5 tablet at 12/21/19 [phone]  . Chlorhexidine Gluconate Cloth 2 % PADS 6 each  6 each Topical Daily Fritzi Mandes, MD      . dimenhyDRINATE (DRAMAMINE) tablet 50 mg  50 mg Oral Q4H PRN Leim Fabry, MD   50 mg at 12/20/19 2058  .  docusate sodium (COLACE) capsule 100 mg  100 mg Oral BID Leim Fabry, MD   100 mg at 12/21/19 0939  . [START ON 12/22/2019] enoxaparin (LOVENOX) injection 30 mg  30 mg Subcutaneous Q24H Shanlever, Charles M, RPH      . estrogens (conjugated) (PREMARIN) tablet 0.625 mg  0.625 mg Oral Daily Leim Fabry, MD   0.625 mg at 12/21/19 [id number]  . FLUoxetine (PROZAC) 20 MG/5ML solution 5 mg  5 mg Oral Daily Leim Fabry, MD   5 mg at 12/21/19 0939  . hydrALAZINE (APRESOLINE) injection 10 mg  10 mg Intravenous Q6H PRN Leim Fabry, MD   10 mg at 12/20/19 1150  . HYDROmorphone (DILAUDID) injection 0.2 mg  0.2 mg Intravenous Q2H PRN Leim Fabry, MD      . loratadine (CLARITIN) tablet 5 mg  5 mg Oral Daily PRN Leim Fabry, MD      . magnesium hydroxide (MILK OF MAGNESIA) suspension 30 mL  30 mL Oral QHS PRN Leim Fabry, MD   30 mL at 12/21/19 1257  . magnesium oxide (MAG-OX) tablet 400 mg  400 mg Oral QHS PRN Leim Fabry, MD      . methocarbamol (ROBAXIN) tablet 500 mg  500 mg Oral Q6H PRN Leim Fabry, MD       Or  . methocarbamol (ROBAXIN) 500 mg in dextrose 5 % 50 mL IVPB  500 mg Intravenous Q6H PRN Leim Fabry, MD      . metoCLOPramide (REGLAN) tablet 5-10 mg  5-10 mg Oral Q8H PRN Leim Fabry, MD       Or  . metoCLOPramide (REGLAN) injection 5-10 mg  5-10 mg Intravenous Q8H PRN Leim Fabry, MD      . ondansetron [facility]) tablet 4 mg  4 mg Oral Q6H PRN Leim Fabry, MD       Or  . ondansetron [facility]) injection 4 mg  4 mg Intravenous Q6H PRN Leim Fabry, MD      . oxyCODONE (Oxy IR/ROXICODONE) immediate release tablet 2.5-5 mg  2.5-5 mg Oral Q3H PRN Leim Fabry, MD      . oxyCODONE (Oxy IR/ROXICODONE) immediate release tablet 5-10 mg  5-10 mg Oral Q4H PRN Leim Fabry, MD      . polyvinyl alcohol (LIQUIFILM TEARS) 1.4 % ophthalmic solution 1 drop  1 drop Both Eyes PRN Fritzi Mandes, MD      . senna-docusate (Senokot-S) tablet 1 tablet  1 tablet Oral QHS PRN Leim Fabry, MD      . sodium phosphate  (FLEET) 7-19 GM/118ML enema 1 enema  1 enema Rectal Once PRN Leim Fabry, MD      . traMADol Veatrice Bourbon) tablet 50 mg  50 mg Oral Q6H PRN Leim Fabry, MD   50 mg at 12/21/19 0730  . tranexamic acid (CYKLOKAPRON) IVPB 1,000 mg  1,000 mg Intravenous Once Leim Fabry, MD      . Triple Antibiotic [phone] OINT  1 application  1 application Apply externally Daily PRN Leim Fabry, MD      . Lynnda Shields 118 MG CAPS 118 mg  118 mg Oral QHS Leim Fabry, MD   118 mg at 12/20/19 2049     Discharge Medications: Please see discharge summary for a list of discharge medications.  Relevant Imaging Results:  Relevant Lab Results:   Additional Information SS# [id number]  Shelbie Ammons, RN

## 2019-12-21 NOTE — TOC Initial Note (Signed)
Transition of Care [facility]) - Initial/Assessment Note    Patient Details  Name: Kaitlyn Church MRN: [id number] Date of Birth: 04/21/1941  Transition [facility] Care [facility]) CM/SW Contact:    Shelbie Ammons, RN Phone Number: 12/21/2019, 3:27 PM  Clinical Narrative:   RNCM assessed patient at bedside, she was sitting up in recliner, alert and verbally responsive but talking volume is very low. Patient requested that this CM contact either Neoma Laming or Leda Gauze to discuss. Patient is from [facility] however she needs rehab at discharge.   Placed call to both Neoma Laming and Leda Gauze and left voicemail for return call.   Received return calls from both of her caregivers and discussed case with both. They are requesting that she go to [facility] and they have already contacted Magda Paganini to discuss. Discussed that this CM will initiate the process and contact Magda Paganini and let them know. PASSR started, they are requesting further information. FL-2 completed, bed search sent. Contacted Magda Paganini to let her know that bed had been requested.             Expected Discharge Plan: Skilled Nursing Facility Barriers to Discharge: Continued Medical Work up   Patient Goals and CMS Choice        Expected Discharge Plan and Services Expected Discharge Plan: [place]   Discharge Planning Services: CM Consult Post Acute Care Choice: [place] Living arrangements for the past 2 months: [place]                                      Prior Living Arrangements/Services Living arrangements for the past 2 months: [place] Lives with:: Facility Resident Patient language and need for interpreter reviewed:: Yes Do you feel safe going back to the place where you live?: Yes      Need for Family Participation in Patient Care: No (Comment) Care giver support system in place?: Yes (comment)   Criminal Activity/Legal Involvement Pertinent to Current  Situation/Hospitalization: No - Comment as needed  Activities of Daily Living Home Assistive Devices/Equipment: Other (Comment)(pt from facility) ADL Screening (condition at time of admission) Patient's cognitive ability adequate to safely complete daily activities?: Yes Is the patient deaf or have difficulty hearing?: No Does the patient have difficulty seeing, even when wearing glasses/contacts?: No Does the patient have difficulty concentrating, remembering, or making decisions?: No Patient able to express need for assistance with ADLs?: Yes Does the patient have difficulty dressing or bathing?: Yes Independently performs ADLs?: No Communication: Needs assistance Is this a change from baseline?: Pre-admission baseline Dressing (OT): Needs assistance Is this a change from baseline?: Pre-admission baseline Grooming: Needs assistance Is this a change from baseline?: Pre-admission baseline Feeding: Independent Bathing: Needs assistance Is this a change from baseline?: Pre-admission baseline Toileting: Needs assistance Is this a change from baseline?: Pre-admission baseline In/Out Bed: Needs assistance Is this a change from baseline?: Pre-admission baseline Walks in Home: Needs assistance Is this a change from baseline?: Pre-admission baseline Does the patient have difficulty walking or climbing stairs?: Yes Weakness of Legs: Right Weakness of Arms/Hands: None  Permission Sought/Granted Permission sought to share information with : Guardian    Share Information with NAME: Coralyn Mark, Marzella Schlein           Emotional Assessment Appearance:: Appears stated age Attitude/Demeanor/Rapport: Engaged Affect (typically observed): Appropriate Orientation: : Oriented to Self, Oriented to Place, Oriented to  Time, Oriented to Situation Alcohol / Substance Use: Not Applicable Psych Involvement: No (comment)  Admission diagnosis:  Hip fracture ([place]) [S72.009A] Fall, initial  encounter [W19.XXXA] Closed right hip fracture, initial encounter [facility]) [S72.001A] Patient Active Problem List   Diagnosis Date Noted  . S/P hip hemiarthroplasty   . Closed right hip fracture, initial encounter ([place]) 12/19/2019  . Fall   . Arthritis 06/01/2016  . B12 deficiency 06/01/2016  . Osteoporosis, post-menopausal 06/01/2016  . Parkinson disease ([place]) 06/01/2016  . Sjogren's syndrome ([place]) 06/01/2016  . Chronic cystitis 06/01/2016  . Dysuria 04/16/2016  . Recurrent major depressive disorder, in partial remission ([place]) 01/07/2016  . Chronic constipation 09/24/2014  . Dysphagia 09/24/2014  . Raynaud's phenomenon without gangrene 09/24/2014  . Chalazion 04/20/2013  . Bilateral dry eyes 03/06/2012  . Blepharitis of both eyes 03/06/2012  . MGD (meibomian gland dysfunction) 03/06/2012  . DYSPHAGIA PHARYNGEAL PHASE 07/21/2009  . Esophageal motility disorder 02/03/2009  . FIBROMYALGIA 02/03/2009  . TROCHANTERIC BURSITIS, BILATERAL 06/21/2008  . HAND PAIN 06/21/2008  . GAIT DISTURBANCE 06/21/2008  . ABDOMINAL, RIGHT UPPER, PAIN 06/21/2008  . GRIEF REACTION 05/30/2008  . VAGINITIS 05/30/2008  . OSTEOPOROSIS 05/30/2008  . HEMORRHOIDS, INTERNAL, THROMBOSED 03/22/2008  . CHANGE IN BOWELS 03/22/2008  . FATIGUE 03/10/2007  . RESTLESS LEG SYNDROME 02/19/2007  . Irritable bowel syndrome 02/19/2007  . ANXIETY 02/16/2007  . DEPRESSION 02/16/2007  . ALLERGIC RHINITIS 02/16/2007  . GERD 02/16/2007  . INTERSTITIAL CYSTITIS 02/16/2007  . [place] SYNDROME 02/16/2007  . FIBROSITIS 02/16/2007   PCP:  Adin Hector, MD Pharmacy:   [place], [place] - [address] [employer] [place] [address] Phone: [phone] Fax: [phone]  Ardeth Perfect, [place] [id number] [place] [address] Phone: [phone] Fax: [phone]     Social Determinants of Health (SDOH) Interventions     Readmission Risk Interventions No flowsheet data found.

## 2019-12-21 NOTE — Evaluation (Signed)
Occupational Therapy Evaluation Patient Details Name: Kaitlyn Church MRN: [id number] DOB: 21-Dec-1940 Today's Date: 12/21/2019    History of Present Illness Pt is a 79 y.o. female presenting to hospital 4/21 s/p fall out of w/c onto R hip.  Imaging showing acute mildly impacted subcapital fx of proximal R femur.  S/p R hip hemiarthroplasty 4/22.  PMH includes B nasal bone fx's (3/18) s/p fall, Parkinson's, anxiety, depression, esophageal motility disorder, fibromyalgia, chronic HA, IBS, and RLS.   Clinical Impression   Kaitlyn Church was seen for OT evaluation this date. Prior to hospital admission, pt was MOD I for mobility using manual wheel chair but reports it is getting "not safe." Pt lives in ALF c assistance available PRN. Pt presents to acute OT demonstrating impaired ADL performance and functional mobility 2/2 pain, functional strength/ROM/balance deficits, decreased LB access, and fine motor deficits. Pt currently requires increased time + SETUP self-feeding/drinking at bed level and long sitting in chair. MAX A adjust socks at bed level c MIN VCs to maintain posterior hip precautions. MOD A SPT bed>chair c VCs for sequencing and pain management techniques. Pt would benefit from skilled OT to address noted impairments and functional limitations (see below for any additional details) in order to maximize safety and independence while minimizing falls risk and caregiver burden. Upon hospital discharge, recommend STR to maximize pt safety and return to PLOF.     Follow Up Recommendations  SNF    Equipment Recommendations       Recommendations for Other Services       Precautions / Restrictions Precautions Precautions: Fall;Posterior Hip Restrictions Weight Bearing Restrictions: Yes RLE Weight Bearing: Weight bearing as tolerated      Mobility Bed Mobility Overal bed mobility: Needs Assistance Bed Mobility: Supine to Sit     Supine to sit: Mod assist;HOB elevated Sit to  supine: +2 for physical assistance;HOB elevated   General bed mobility comments: assist for trunk and RLE advancement    Transfers Overall transfer level: Needs assistance   Transfers: Sit to/from Stand;Stand Pivot Transfers Sit to Stand: Mod assist;From elevated surface Stand pivot transfers: Mod assist;From elevated surface       General transfer comment: MOD A + VCs to maintainposterior hip PCNs    Balance Overall balance assessment: Needs assistance Sitting-balance support: Bilateral upper extremity supported;Feet supported Sitting balance-Leahy Scale: Fair Sitting balance - Comments: steady static sitting   Standing balance support: Bilateral upper extremity supported Standing balance-Leahy Scale: Poor                             ADL either performed or assessed with clinical judgement   ADL Overall ADL's : Needs assistance/impaired                                       General ADL Comments: Increased time + SETUP self-feeding/drinking at bed level. MAX A adjust socks at bed level - VCs to maintain posterior hip precautions.      Vision Baseline Vision/History: Wears glasses Wears Glasses: Reading only       Perception     Praxis      Pertinent Vitals/Pain Pain Assessment: Faces Faces Pain Scale: Hurts even more Pain Location: R hip Pain Descriptors / Indicators: Aching;Sore;Tender Pain Intervention(s): Limited activity within patient's tolerance;Repositioned;Ice applied;Utilized relaxation techniques     Hand Dominance  Extremity/Trunk Assessment Upper Extremity Assessment Upper Extremity Assessment: Generalized weakness   Lower Extremity Assessment Lower Extremity Assessment: Generalized weakness RLE Deficits / Details: R knee internally rotated  RLE: Unable to fully assess due to pain       Communication Communication Communication: (pt's voice very soft)   Cognition Arousal/Alertness: Awake/alert Behavior  During Therapy: WFL for tasks assessed/performed Overall Cognitive Status: Within Functional Limits for tasks assessed                                     General Comments       Exercises Exercises: Other exercises Other Exercises Other Exercises: Pt educated re: functional application of posterior hip precautions, d/c recommendations, DME recommendations, relaxation techniques Other Exercises: LBD, feeding, positioning for pain management, sup>sit, sit<>stand, SPT   Shoulder Instructions      Home Living Family/patient expects to be discharged to:: Assisted living                             Home Equipment: Wheelchair - manual;Shower seat;Grab bars - tub/shower;Grab bars - toilet;Wheelchair - power(Pt reports using feet to self-propel manual w/c but states it is "not safe." Pt reports sliding forward despite posterior pelvic tilt seat cushion. Pt states she may have power w/c in storage that she has not used. )   Additional Comments: [facility]      Prior Functioning/Environment Level of Independence: Needs assistance  Gait / Transfers Assistance Needed: Pt reports self-propelling manual w/c using feet, SPT c SUP ADL's / Homemaking Assistance Needed: Aids assist c ADLs ~4 times per day, pt did not state what assist is for            OT Problem List: Decreased strength;Decreased range of motion;Impaired balance (sitting and/or standing);Decreased knowledge of use of DME or AE      OT Treatment/Interventions: Self-care/ADL training;Therapeutic exercise;Energy conservation;DME and/or AE instruction;Therapeutic activities;Patient/family education;Balance training    OT Goals(Current goals can be found in the care plan section) Acute Rehab OT Goals Patient Stated Goal: to improve mobility and have less pain OT Goal Formulation: With patient Time For Goal Achievement: 01/04/20 Potential to Achieve Goals: Good ADL Goals Pt Will Perform Grooming:  with modified independence;sitting(c no cues to maintain hip pcns) Pt Will Perform Lower Body Dressing: with min assist;with adaptive equipment;bed level Pt Will Transfer to Toilet: with supervision;with min guard assist;stand pivot transfer;bedside commode(c LRAD PRN)  OT Frequency: Min 2X/week   Barriers to D/C: Decreased caregiver support          Co-evaluation              AM-PAC OT "6 Clicks" Daily Activity     Outcome Measure Help from another person eating meals?: A Little Help from another person taking care of personal grooming?: A Little Help from another person toileting, which includes using toliet, bedpan, or urinal?: A Lot Help from another person bathing (including washing, rinsing, drying)?: A Lot Help from another person to put on and taking off regular upper body clothing?: A Little Help from another person to put on and taking off regular lower body clothing?: A Lot 6 Click Score: 15   End of Session    Activity Tolerance: Patient tolerated treatment well Patient left: in chair;with call bell/phone within reach;with chair alarm set  OT Visit Diagnosis: Unsteadiness on feet (R26.81);Other abnormalities of  gait and mobility (R26.89)                Time: [id number] OT Time Calculation (min): 45 min Charges:  OT General Charges $OT Visit: 1 Visit OT Evaluation $OT Eval Moderate Complexity: 1 Mod OT Treatments $Self Care/Home Management : 38-52 mins  Dessie Coma, M.S. OTR/L  12/21/19, 2:59 PM

## 2019-12-21 NOTE — Progress Notes (Signed)
PHARMACIST - PHYSICIAN COMMUNICATION  CONCERNING:  Enoxaparin (Lovenox) for DVT Prophylaxis    RECOMMENDATION: Patient was prescribed enoxaprin 40mg  q24 hours for VTE prophylaxis.   Filed Weights   12/19/19 1043  Weight: (S) 43 kg (94 lb 12.8 oz)    Body mass index is 15.78 kg/m.  Estimated Creatinine Clearance: 38.7 mL/min (by C-G formula based on SCr of 0.55 mg/dL).   Patient is candidate for enoxaparin 30mg  every 24 hours based on Weight less than 45kg for women  DESCRIPTION: Pharmacy has adjusted enoxaparin dose per [facility] policy.   Patient is now receiving enoxaparin 30mg  every 24 hours.  Lu Duffel, PharmD Clinical Pharmacist  12/21/2019 9:56 AM

## 2019-12-21 NOTE — Progress Notes (Signed)
Physical Therapy Treatment Patient Details Name: Kaitlyn Church MRN: [id number] DOB: 1940/12/19 Today's Date: 12/21/2019    History of Present Illness Pt is a 79 y.o. female presenting to hospital 4/21 s/p fall out of w/c onto R hip.  Imaging showing acute mildly impacted subcapital fx of proximal R femur.  S/p R hip hemiarthroplasty 4/22.  PMH includes B nasal bone fx's (3/18) s/p fall, Parkinson's, anxiety, depression, esophageal motility disorder, fibromyalgia, chronic HA, IBS, and RLS.    PT Comments    Pt resting in recliner upon PT arrival and had just finished her lunch.  Sit to stand from recliner with mod assist and stand step turn recliner to bed with UE support min to mod assist x1.  Pt repositioned in bed with hip aBduction wedge in place to attempt to improve R LE positioning (to decrease R LE internal rotation position).  Will continue to focus on strengthening and progressive functional mobility per pt tolerance.    Follow Up Recommendations  SNF     Equipment Recommendations  3in1 (PT);Wheelchair (measurements PT);Wheelchair cushion (measurements PT)    Recommendations for Other Services OT consult     Precautions / Restrictions Precautions Precautions: Fall;Posterior Hip Restrictions Weight Bearing Restrictions: Yes RLE Weight Bearing: Weight bearing as tolerated    Mobility  Bed Mobility Overal bed mobility: Needs Assistance Bed Mobility: Sit to Supine     Sit to supine: Mod assist;Max assist   General bed mobility comments: assist for trunk and R>L LE  Transfers Overall transfer level: Needs assistance   Transfers: Sit to/from Stand;Stand Pivot Transfers Sit to Stand: Mod assist(from recliner) Stand pivot transfers: Min assist;Mod assist       General transfer comment: sit to stand from recliner with UE support; stand step turn recliner to bed with min to mod assist x1 (pt holding onto therapists arms for support)  Ambulation/Gait                 Stairs             Wheelchair Mobility    Modified Rankin (Stroke Patients Only)       Balance Overall balance assessment: Needs assistance Sitting-balance support: No upper extremity supported;Feet supported Sitting balance-Leahy Scale: Good Sitting balance - Comments: steady sitting brushing teeth   Standing balance support: Bilateral upper extremity supported Standing balance-Leahy Scale: Poor Standing balance comment: pt requiring B UE support for static standing balance                            Cognition Arousal/Alertness: Awake/alert Behavior During Therapy: WFL for tasks assessed/performed Overall Cognitive Status: Within Functional Limits for tasks assessed                                        Exercises     General Comments   Nursing cleared pt for participation in physical therapy.  Pt agreeable to PT session.      Pertinent Vitals/Pain Pain Assessment: 0-10 Pain Score: 3  Faces Pain Scale: Hurts even more Pain Location: R hip Pain Descriptors / Indicators: Aching;Sore;Tender Pain Intervention(s): Limited activity within patient's tolerance;Monitored during session;Repositioned;Ice applied    Home Living             Uses feet to propel manual w/c.   Prior Function     PT Goals (current goals  can now be found in the care plan section) Acute Rehab PT Goals Patient Stated Goal: to improve mobility and have less pain PT Goal Formulation: With patient Time For Goal Achievement: 01/04/20 Potential to Achieve Goals: Good Progress towards PT goals: Progressing toward goals    Frequency    BID      PT Plan Current plan remains appropriate    Co-evaluation              AM-PAC PT "6 Clicks" Mobility   Outcome Measure  Help needed turning from your back to your side while in a flat bed without using bedrails?: A Little Help needed moving from lying on your back to sitting on the side of a  flat bed without using bedrails?: A Lot Help needed moving to and from a bed to a chair (including a wheelchair)?: A Lot Help needed standing up from a chair using your arms (e.g., wheelchair or bedside chair)?: A Lot Help needed to walk in hospital room?: Total Help needed climbing 3-5 steps with a railing? : Total 6 Click Score: 11    End of Session Equipment Utilized During Treatment: Gait belt Activity Tolerance: Patient tolerated treatment well Patient left: in bed;with call bell/phone within reach;with bed alarm set;Other (comment)(hip aBduction wedge in place; B heels floating via towel rolls; ice pack to R hip) Nurse Communication: Mobility status;Precautions;Weight bearing status PT Visit Diagnosis: Other abnormalities of gait and mobility (R26.89);Muscle weakness (generalized) (M62.81);Repeated falls (R29.6);History of falling (Z91.81);Pain Pain - Right/Left: Right Pain - part of body: Hip     Time: [id number] PT Time Calculation (min) (ACUTE ONLY): 24 min  Charges:  $Therapeutic Activity: 23-37 mins                     Leitha Bleak, PT 12/21/19, 4:26 PM

## 2019-12-21 NOTE — NC FL2 (Signed)
[place] LEVEL OF CARE SCREENING TOOL     IDENTIFICATION  Patient Name: Kaitlyn Church Birthdate: 14-Jan-1941 Sex: female Admission Date (Current Location): 12/19/2019  [facility] and [place] Number:  Engineering geologist and Address:  [facility], [address] [place], [place] [address]      Provider Number: [id number]  Attending Physician Name and Address:  Fritzi Mandes, MD  Relative Name and Phone Number:       Current Level of Care: Hospital Recommended Level of Care: [place] Prior Approval Number:    Date Approved/Denied:   PASRR Number:    Discharge Plan: SNF    Current Diagnoses: Patient Active Problem List   Diagnosis Date Noted  . S/P hip hemiarthroplasty   . Closed right hip fracture, initial encounter ([place]) 12/19/2019  . Fall   . Arthritis 06/01/2016  . B12 deficiency 06/01/2016  . Osteoporosis, post-menopausal 06/01/2016  . Parkinson disease ([place]) 06/01/2016  . Sjogren's syndrome ([place]) 06/01/2016  . Chronic cystitis 06/01/2016  . Dysuria 04/16/2016  . Recurrent major depressive disorder, in partial remission ([place]) 01/07/2016  . Chronic constipation 09/24/2014  . Dysphagia 09/24/2014  . Raynaud's phenomenon without gangrene 09/24/2014  . Chalazion 04/20/2013  . Bilateral dry eyes 03/06/2012  . Blepharitis of both eyes 03/06/2012  . MGD (meibomian gland dysfunction) 03/06/2012  . DYSPHAGIA PHARYNGEAL PHASE 07/21/2009  . Esophageal motility disorder 02/03/2009  . FIBROMYALGIA 02/03/2009  . TROCHANTERIC BURSITIS, BILATERAL 06/21/2008  . HAND PAIN 06/21/2008  . GAIT DISTURBANCE 06/21/2008  . ABDOMINAL, RIGHT UPPER, PAIN 06/21/2008  . GRIEF REACTION 05/30/2008  . VAGINITIS 05/30/2008  . OSTEOPOROSIS 05/30/2008  . HEMORRHOIDS, INTERNAL, THROMBOSED 03/22/2008  . CHANGE IN BOWELS 03/22/2008  . FATIGUE 03/10/2007  . RESTLESS LEG SYNDROME 02/19/2007  . Irritable bowel syndrome 02/19/2007  .  ANXIETY 02/16/2007  . DEPRESSION 02/16/2007  . ALLERGIC RHINITIS 02/16/2007  . GERD 02/16/2007  . INTERSTITIAL CYSTITIS 02/16/2007  . [place] SYNDROME 02/16/2007  . FIBROSITIS 02/16/2007    Orientation RESPIRATION BLADDER Height & Weight     Self, Time, Situation, Place  Normal External catheter Weight: (S) 43 kg Height:  (S) 5\' 5"  (165.1 cm)  BEHAVIORAL SYMPTOMS/MOOD NEUROLOGICAL BOWEL NUTRITION STATUS      Continent Diet(Regular)  AMBULATORY STATUS COMMUNICATION OF NEEDS Skin   Extensive Assist Verbally Surgical wounds                       Personal Care Assistance Level of Assistance  Bathing, Dressing Bathing Assistance: Limited assistance   Dressing Assistance: Limited assistance     Functional Limitations Info  Speech     Speech Info: Impaired(speaks very softly)    SPECIAL CARE FACTORS FREQUENCY  PT (By licensed PT), OT (By licensed OT)                    Contractures Contractures Info: Not present    Additional Factors Info  Code Status, Allergies Code Status Info: Full Allergies Info: Lidocaine, Other, Propoxyphene, Ascorbic Acid, Ascorbic Acid, Azithromycin, Basil Oil, Calcium, Celecoxib, Cephalexin, Dexamethasone, Doxycycline, Itraconazole, Meperidine Hcl, Mirabegron, Morphine, Morphine Sulfate, Nitrofurantoin, Origanum Oil, Pantoprazole Sodium, Penicillins, Propoxyphene Hcl, Sulfa Antibiotics, Sulfamethoxazole-trimethoprim, Thimerosal           Current Medications (12/21/2019):  This is the current hospital active medication list Current Facility-Administered Medications  Medication Dose Route Frequency Provider Last Rate Last Admin  . 0.9 %  sodium chloride infusion   Intravenous  Continuous Leim Fabry, MD 75 mL/hr at 12/21/19 0647 New Bag at 12/21/19 0647  . acetaminophen (TYLENOL) tablet 1,000 mg  1,000 mg Oral Q8H Leim Fabry, MD   1,000 mg at 12/21/19 0939  . bisacodyl (DULCOLAX) suppository 10 mg  10 mg Rectal Daily PRN Leim Fabry, MD      . carbidopa-levodopa (SINEMET IR) 25-100 MG per tablet immediate release 1.5 tablet  1.5 tablet Oral 5 X Daily Leim Fabry, MD   1.5 tablet at 12/21/19 [phone]  . Chlorhexidine Gluconate Cloth 2 % PADS 6 each  6 each Topical Daily Fritzi Mandes, MD      . dimenhyDRINATE (DRAMAMINE) tablet 50 mg  50 mg Oral Q4H PRN Leim Fabry, MD   50 mg at 12/20/19 2058  . docusate sodium (COLACE) capsule 100 mg  100 mg Oral BID Leim Fabry, MD   100 mg at 12/21/19 0939  . [START ON 12/22/2019] enoxaparin (LOVENOX) injection 30 mg  30 mg Subcutaneous Q24H Shanlever, Charles M, RPH      . estrogens (conjugated) (PREMARIN) tablet 0.625 mg  0.625 mg Oral Daily Leim Fabry, MD   0.625 mg at 12/21/19 [id number]  . FLUoxetine (PROZAC) 20 MG/5ML solution 5 mg  5 mg Oral Daily Leim Fabry, MD   5 mg at 12/21/19 0939  . hydrALAZINE (APRESOLINE) injection 10 mg  10 mg Intravenous Q6H PRN Leim Fabry, MD   10 mg at 12/20/19 1150  . HYDROmorphone (DILAUDID) injection 0.2 mg  0.2 mg Intravenous Q2H PRN Leim Fabry, MD      . loratadine (CLARITIN) tablet 5 mg  5 mg Oral Daily PRN Leim Fabry, MD      . magnesium hydroxide (MILK OF MAGNESIA) suspension 30 mL  30 mL Oral QHS PRN Leim Fabry, MD   30 mL at 12/21/19 1257  . magnesium oxide (MAG-OX) tablet 400 mg  400 mg Oral QHS PRN Leim Fabry, MD      . methocarbamol (ROBAXIN) tablet 500 mg  500 mg Oral Q6H PRN Leim Fabry, MD       Or  . methocarbamol (ROBAXIN) 500 mg in dextrose 5 % 50 mL IVPB  500 mg Intravenous Q6H PRN Leim Fabry, MD      . metoCLOPramide (REGLAN) tablet 5-10 mg  5-10 mg Oral Q8H PRN Leim Fabry, MD       Or  . metoCLOPramide (REGLAN) injection 5-10 mg  5-10 mg Intravenous Q8H PRN Leim Fabry, MD      . ondansetron [facility]) tablet 4 mg  4 mg Oral Q6H PRN Leim Fabry, MD       Or  . ondansetron [facility]) injection 4 mg  4 mg Intravenous Q6H PRN Leim Fabry, MD      . oxyCODONE (Oxy IR/ROXICODONE) immediate release tablet 2.5-5 mg  2.5-5  mg Oral Q3H PRN Leim Fabry, MD      . oxyCODONE (Oxy IR/ROXICODONE) immediate release tablet 5-10 mg  5-10 mg Oral Q4H PRN Leim Fabry, MD      . polyvinyl alcohol (LIQUIFILM TEARS) 1.4 % ophthalmic solution 1 drop  1 drop Both Eyes PRN Fritzi Mandes, MD      . senna-docusate (Senokot-S) tablet 1 tablet  1 tablet Oral QHS PRN Leim Fabry, MD      . sodium phosphate (FLEET) 7-19 GM/118ML enema 1 enema  1 enema Rectal Once PRN Leim Fabry, MD      . traMADol Veatrice Bourbon) tablet 50 mg  50 mg Oral Q6H PRN Leim Fabry,  MD   50 mg at 12/21/19 0730  . tranexamic acid (CYKLOKAPRON) IVPB 1,000 mg  1,000 mg Intravenous Once Leim Fabry, MD      . Triple Antibiotic [id number] OINT 1 application  1 application Apply externally Daily PRN Leim Fabry, MD      . Lynnda Shields 118 MG CAPS 118 mg  118 mg Oral QHS Leim Fabry, MD   118 mg at 12/20/19 2049     Discharge Medications: Please see discharge summary for a list of discharge medications.  Relevant Imaging Results:  Relevant Lab Results:   Additional Information SS# [id number]  Shelbie Ammons, RN

## 2019-12-21 NOTE — Evaluation (Signed)
Physical Therapy Evaluation Patient Details Name: Kaitlyn Church MRN: [id number] DOB: 1941-03-08 Today's Date: 12/21/2019   History of Present Illness  Pt is a 79 y.o. female presenting to hospital 4/21 s/p fall out of w/c onto R hip.  Imaging showing acute mildly impacted subcapital fx of proximal R femur.  S/p R hip hemiarthroplasty 4/22.  PMH includes B nasal bone fx's (3/18) s/p fall, Parkinson's, anxiety, depression, esophageal motility disorder, fibromyalgia, chronic HA, IBS, and RLS.  Clinical Impression  Prior to hospital admission, pt was able to perform stand pivot transfers to w/c and propel manual w/c--sometimes with assist and sometimes without assist (and walk short distances with assist); lives at [facility].  Currently pt is 1-2 assist with bed mobility; close SBA with sitting balance.  Pt's R LE noted to be internally rotated when therapist entered pt's room (even with R hip abduction wedge in place) and difficult to position R LE in neutral position (nurse notified and came to assess): deferred further mobility d/t this concern.  Imaging performed after therapy session and MD reports no dislocation and cleared pt to participate in therapy.  Pt would benefit from skilled PT to address noted impairments and functional limitations (see below for any additional details).  Upon hospital discharge, pt would benefit from STR.    Follow Up Recommendations SNF    Equipment Recommendations  3in1 (PT);Wheelchair (measurements PT);Wheelchair cushion (measurements PT)    Recommendations for Other Services OT consult     Precautions / Restrictions Precautions Precautions: Fall;Posterior Hip Restrictions Weight Bearing Restrictions: Yes RLE Weight Bearing: Weight bearing as tolerated      Mobility  Bed Mobility Overal bed mobility: Needs Assistance Bed Mobility: Supine to Sit;Sit to Supine     Supine to sit: Max assist;HOB elevated Sit to supine: +2 for physical  assistance;HOB elevated   General bed mobility comments: assist for trunk and B LE's semi-supine to/from sitting edge of bed  Transfers                 General transfer comment: Deferred d/t concerns regarding R LE internal rotation  Ambulation/Gait                Stairs            Wheelchair Mobility    Modified Rankin (Stroke Patients Only)       Balance Overall balance assessment: Needs assistance Sitting-balance support: Bilateral upper extremity supported;Feet supported Sitting balance-Leahy Scale: Fair Sitting balance - Comments: steady static sitting                                     Pertinent Vitals/Pain Pain Assessment: 0-10 Pain Score: 6  Pain Location: R hip Pain Descriptors / Indicators: Aching;Sore;Tender Pain Intervention(s): Limited activity within patient's tolerance;Monitored during session;Repositioned;Ice applied    Home Living Family/patient expects to be discharged to:: Assisted living               Home Equipment: Wheelchair - manual Additional Comments: Furniture conservator/restorer    Prior Function Level of Independence: Needs assistance   Gait / Transfers Assistance Needed: pt reports that she typically uses w/c most of the day but does attempt to walk short distances (but "it's dangerous"); performs stand pivot transfers           Hand Dominance        Extremity/Trunk Assessment   Upper Extremity Assessment Upper  Extremity Assessment: Generalized weakness    Lower Extremity Assessment Lower Extremity Assessment: RLE deficits/detail;LLE deficits/detail RLE Deficits / Details: R LE internally rotated; R ankle DF/PF at least 2+/5 AROM RLE: Unable to fully assess due to pain       Communication   Communication: (pt's voice very soft)  Cognition Arousal/Alertness: Awake/alert Behavior During Therapy: WFL for tasks assessed/performed Overall Cognitive Status: Within Functional Limits for tasks  assessed                                        General Comments   Nursing cleared pt for participation in physical therapy.  Pt agreeable to PT session.    Exercises     Assessment/Plan    PT Assessment Patient needs continued PT services  PT Problem List Decreased strength;Decreased range of motion;Decreased activity tolerance;Decreased balance;Decreased mobility;Decreased knowledge of use of DME;Pain;Decreased skin integrity;Decreased knowledge of precautions       PT Treatment Interventions DME instruction;Functional mobility training;Therapeutic activities;Therapeutic exercise;Balance training;Patient/family education    PT Goals (Current goals can be found in the Care Plan section)  Acute Rehab PT Goals Patient Stated Goal: to improve mobility and have less pain PT Goal Formulation: With patient Time For Goal Achievement: 01/04/20 Potential to Achieve Goals: Good    Frequency BID   Barriers to discharge Decreased caregiver support      Co-evaluation               AM-PAC PT "6 Clicks" Mobility  Outcome Measure Help needed turning from your back to your side while in a flat bed without using bedrails?: A Little Help needed moving from lying on your back to sitting on the side of a flat bed without using bedrails?: A Lot Help needed moving to and from a bed to a chair (including a wheelchair)?: Total Help needed standing up from a chair using your arms (e.g., wheelchair or bedside chair)?: Total Help needed to walk in hospital room?: Total Help needed climbing 3-5 steps with a railing? : Total 6 Click Score: 9    End of Session   Activity Tolerance: Patient limited by pain Patient left: in bed;with call bell/phone within reach;with bed alarm set;Other (comment)(hip aBduction pillow in place; B heels floating via towel rolls; ice pack to L hip) Nurse Communication: Mobility status;Precautions;Weight bearing status;Other (comment)(Pt's R LE  internally rotated) PT Visit Diagnosis: Other abnormalities of gait and mobility (R26.89);Muscle weakness (generalized) (M62.81);Repeated falls (R29.6);History of falling (Z91.81);Pain Pain - Right/Left: Right Pain - part of body: Hip    Time: [id number] PT Time Calculation (min) (ACUTE ONLY): 30 min   Charges:   PT Evaluation $PT Eval Low Complexity: 1 Low PT Treatments $Therapeutic Activity: 8-22 mins        Leitha Bleak, PT 12/21/19, 1:21 PM

## 2019-12-21 NOTE — Discharge Instructions (Signed)
POSTERIOR TOTAL HIP REPLACEMENT POSTOPERATIVE DIRECTIONS  Hip Rehabilitation, Guidelines Following Surgery  The results of a hip operation are greatly improved after range of motion and muscle strengthening exercises. Follow all safety measures which are given to protect your hip. If any of these exercises cause increased pain or swelling in your joint, decrease the amount until you are comfortable again. Then slowly increase the exercises. Call your caregiver if you have problems or questions.   HOME CARE INSTRUCTIONS  Remove items at home which could result in a fall. This includes throw rugs or furniture in walking pathways.   ICE to the affected hip every three hours for 30 minutes at a time and then as needed for pain and swelling.  Continue to use ice on the hip for pain and swelling from surgery. You may notice swelling that will progress down to the foot and ankle.  This is normal after surgery.  Elevate the leg when you are not up walking on it.    Continue to use the breathing machine which will help keep your temperature down.  It is common for your temperature to cycle up and down following surgery, especially at night when you are not up moving around and exerting yourself.  The breathing machine keeps your lungs expanded and your temperature down.  DIET You may resume your previous home diet once your are discharged from the hospital.  DRESSING / WOUND CARE / SHOWERING You may change your dressing 3-5 days after surgery.  Then change the dressing every day with sterile gauze.  Please use good hand washing techniques before changing the dressing.  Do not use any lotions or creams on the incision until instructed by your surgeon. You need to keep your dressing dry after discharge.   Change the surgical dressing if needed with Physical Therapy and reapply a dry dressing each time.    ACTIVITY Walk with your walker as instructed. Use walker as long as suggested by your  caregivers. Avoid periods of inactivity such as sitting longer than an hour when not asleep. This helps prevent blood clots.  You may resume a sexual relationship in one month or when given the OK by your doctor.  You may return to work once you are cleared by your doctor.  Do not drive a car for 6 weeks or until released by you surgeon.  Do not drive while taking narcotics.  WEIGHT BEARING Weight bearing as tolerated with assist device (walker, cane, etc) as directed, use it as long as suggested by your surgeon or therapist, typically at least 4-6 weeks.  POSTOPERATIVE CONSTIPATION PROTOCOL Constipation - defined medically as fewer than three stools per week and severe constipation as less than one stool per week.  One of the most common issues patients have following surgery is constipation.  Even if you have a regular bowel pattern at home, your normal regimen is likely to be disrupted due to multiple reasons following surgery.  Combination of anesthesia, postoperative narcotics, change in appetite and fluid intake all can affect your bowels.  In order to avoid complications following surgery, here are some recommendations in order to help you during your recovery period.  Colace (docusate) - Pick up an over-the-counter form of Colace or another stool softener and take twice a day as long as you are requiring postoperative pain medications.  Take with a full glass of water daily.  If you experience loose stools or diarrhea, hold the colace until you stool forms back up.  If your symptoms do not get better within 1 week or if they get worse, check with your doctor.  Dulcolax (bisacodyl) - Pick up over-the-counter and take as directed by the product packaging as needed to assist with the movement of your bowels.  Take with a full glass of water.  Use this product as needed if not relieved by Colace only.   MiraLax (polyethylene glycol) - Pick up over-the-counter to have on hand.  MiraLax is a  solution that will increase the amount of water in your bowels to assist with bowel movements.  Take as directed and can mix with a glass of water, juice, soda, coffee, or tea.  Take if you go more than two days without a movement. Do not use MiraLax more than once per day. Call your doctor if you are still constipated or irregular after using this medication for 7 days in a row.  If you continue to have problems with postoperative constipation, please contact the office for further assistance and recommendations.  If you experience "the worst abdominal pain ever" or develop nausea or vomiting, please contact the office immediatly for further recommendations for treatment.  ITCHING  If you experience itching with your medications, try taking only a single pain pill, or even half a pain pill at a time.  You can also use Benadryl over the counter for itching or also to help with sleep.   TED HOSE STOCKINGS Wear the elastic stockings on both legs for three weeks following surgery during the day but you may remove then at night for sleeping.  MEDICATIONS See your medication summary on the "After Visit Summary" that the nursing staff will review with you prior to discharge.  You may have some home medications which will be placed on hold until you complete the course of blood thinner medication.  It is important for you to complete the blood thinner medication as prescribed by your surgeon.  Continue your approved medications as instructed at time of discharge.  PRECAUTIONS If you experience chest pain or shortness of breath - call 911 immediately for transfer to the hospital emergency department.  If you develop a fever greater that 101 F, purulent drainage from wound, increased redness or drainage from wound, foul odor from the wound/dressing, or calf pain - CONTACT YOUR SURGEON.                                                   FOLLOW-UP APPOINTMENTS Follow-up in 2 weeks at the [facility] clinic for  staple removal and right hip x-rays. Make sure you keep all of your appointments after your operation with your surgeon and caregivers. You should call the office at the above phone number and make an appointment for approximately two weeks after the date of your surgery or on the date instructed by your surgeon outlined in the "After Visit Summary".  RANGE OF MOTION AND STRENGTHENING EXERCISES  These exercises are designed to help you keep full movement of your hip joint. Follow your caregiver's or physical therapist's instructions. Perform all exercises about fifteen times, three times per day or as directed. Exercise both hips, even if you have had only one joint replacement. These exercises can be done on a training (exercise) mat, on the floor, on a table or on a bed. Use whatever works the best and is  most comfortable for you. Use music or television while you are exercising so that the exercises are a pleasant break in your day. This will make your life better with the exercises acting as a break in routine you can look forward to.  Lying on your back, slowly slide your foot toward your buttocks, raising your knee up off the floor. Then slowly slide your foot back down until your leg is straight again.  Lying on your back spread your legs as far apart as you can without causing discomfort.  Lying on your side, raise your upper leg and foot straight up from the floor as far as is comfortable. Slowly lower the leg and repeat.  Lying on your back, tighten up the muscle in the front of your thigh (quadriceps muscles). You can do this by keeping your leg straight and trying to raise your heel off the floor. This helps strengthen the largest muscle supporting your knee.  Lying on your back, tighten up the muscles of your buttocks both with the legs straight and with the knee bent at a comfortable angle while keeping your heel on the floor.      IF YOU ARE TRANSFERRED TO A SKILLED REHAB FACILITY If  the patient is transferred to a skilled rehab facility following release from the hospital, a list of the current medications will be sent to the facility for the patient to continue.  When discharged from the skilled rehab facility, please have the facility set up the patient's [place] prior to being released. Also, the skilled facility will be responsible for providing the patient with their medications at time of release from the facility to include their pain medication, the muscle relaxants, and their blood thinner medication. If the patient is still at the rehab facility at time of the two week follow up appointment, the skilled rehab facility will also need to assist the patient in arranging follow up appointment in our office and any transportation needs.  MAKE SURE YOU:  Understand these instructions.  Get help right away if you are not doing well or get worse.    Pick up stool softner and laxative for home use following surgery while on pain medications. Do not submerge incision under water. Please use good hand washing techniques while changing dressing each day. May shower starting three days after surgery. Please use a clean towel to pat the incision dry following showers. Continue to use ice for pain and swelling after surgery. Do not use any lotions or creams on the incision until instructed by your surgeon.

## 2019-12-22 LAB — CBC
HCT: 28.1 % — ABNORMAL LOW (ref 36.0–46.0)
Hemoglobin: 9.3 g/dL — ABNORMAL LOW (ref 12.0–15.0)
MCH: 30.4 pg (ref 26.0–34.0)
MCHC: 33.1 g/dL (ref 30.0–36.0)
MCV: 91.8 fL (ref 80.0–100.0)
Platelets: 179 10*3/uL (ref 150–400)
RBC: 3.06 MIL/uL — ABNORMAL LOW (ref 3.87–5.11)
RDW: 13.3 % (ref 11.5–15.5)
WBC: 8 10*3/uL (ref 4.0–10.5)
nRBC: 0 % (ref 0.0–0.2)

## 2019-12-22 MED ORDER — FLEET ENEMA 7-19 GM/118ML RE ENEM
1.0000 | ENEMA | Freq: Once | RECTAL | Status: AC
  Administered 2019-12-22: 1 via RECTAL

## 2019-12-22 MED ORDER — CARBIDOPA-LEVODOPA ER 50-200 MG PO TBCR
2.0000 | EXTENDED_RELEASE_TABLET | Freq: Every day | ORAL | Status: DC
  Administered 2019-12-22 – 2019-12-23 (×2): 2 via ORAL
  Filled 2019-12-22 (×3): qty 2

## 2019-12-22 NOTE — Progress Notes (Signed)
PT Cancellation Note  Patient Details Name: Kaitlyn Church MRN: [id number] DOB: 07-18-1941   Cancelled Treatment:     PT attempt, Pt c/o severe stomach pain requesting therapist return later. Will return at later time this date.    Willette Pa 12/22/2019, 10:01 AM

## 2019-12-22 NOTE — Progress Notes (Addendum)
Order for in and out cath. Pt tolerated well. 250 mL out. Patients urine is greenish.

## 2019-12-22 NOTE — Progress Notes (Signed)
Foley removed at 2203. At 0440 patient started complaining of stomach pain. Said her stomach was tender to touch and that she needed to pee but that she couldn't. Bladder scanned pt and she had 362 in her.

## 2019-12-22 NOTE — Progress Notes (Signed)
Physical Therapy Treatment Patient Details Name: Kaitlyn Church MRN: [id number] DOB: [birth year] Today's Date: 12/22/2019    History of Present Illness Pt is a 79 y.o. female presenting to hospital 4/21 s/p fall out of w/c onto R hip.  Imaging showing acute mildly impacted subcapital fx of proximal R femur.  S/p R hip hemiarthroplasty 4/22.  PMH includes B nasal bone fx's (3/18) s/p fall, Parkinson's, anxiety, depression, esophageal motility disorder, fibromyalgia, chronic HA, IBS, and RLS.    PT Comments    Pt presented supine in bed with nursing and visitor present in room, BLE abduction brace in place. Pt willing to cooperate for therapy prior to receiving enema. Able to transfer from supine to EOB with modA, required assistance with trunk posture and mobilizing RLE to maintain hip precautions, as pt demonstrated decreased volitional movement of RLE rotated internally throughout session. Pt tolerated sitting EOB with supervision, no LOB or UE assist required, feet supported on floor. MaxA+1 to transfer sit to stand to RW, VCs for hand placement on bed and RW handles, therapist required to hold RW in place due to pt posterior lean. ModA with gait belt to maintain standing position, pt noted her RLE felt shorter, evident leg length discrepancy possibly 2/2 scoliosis or hx of surgery; pt required mod VC to maintain upright posture and foot placement for balance, tactile cues for hip extension elicited increased pain. Pt performed 3 STS transfers during session at EOB, followed by 2 lateral steps to the right during last bout of standing for improved bed positioning, requiring modA for foot positioning and maintaining balance. Pt was returned to supine position in bed with call bell and personal belongings within reach and nursing & visitor in the room. Pt progressing toward her goals. Recommend SNF rehab follow for treatment to improve functional mobility for transfer safety and retention of ADL  independence.     Follow Up Recommendations  SNF     Equipment Recommendations  3in1 (PT);Wheelchair (measurements PT);Wheelchair cushion (measurements PT)    Recommendations for Other Services       Precautions / Restrictions Precautions Precautions: Fall;Posterior Hip Restrictions Weight Bearing Restrictions: Yes RLE Weight Bearing: Weight bearing as tolerated    Mobility  Bed Mobility Overal bed mobility: Needs Assistance Bed Mobility: Sit to Supine     Supine to sit: Mod assist;HOB elevated Sit to supine: Max assist   General bed mobility comments: Assist for trunk and maintain hip precautions  Transfers Overall transfer level: Needs assistance Equipment used: Rolling walker (2 wheeled) Transfers: Sit to/from Stand Sit to Stand: Mod assist;+2 safety/equipment Stand pivot transfers: Mod assist;+2 safety/equipment       General transfer comment: STS with RW, gait belt, therapist holding down RW due to posterior pushing, vcs for hand placement on RW handles and pushing from bed.  Ambulation/Gait Ambulation/Gait assistance: Mod assist Gait Distance (Feet): 2 Feet Assistive device: Rolling walker (2 wheeled)       General Gait Details: 2 lateral steps to right for bed positioning   Stairs             Wheelchair Mobility    Modified Rankin (Stroke Patients Only)       Balance Overall balance assessment: Needs assistance Sitting-balance support: No upper extremity supported;Feet supported Sitting balance-Leahy Scale: Good Sitting balance - Comments: tolerates sitting position well with supervision   Standing balance support: Bilateral upper extremity supported Standing balance-Leahy Scale: Poor Standing balance comment: pt requiring B UE support for static standing balance  Cognition Arousal/Alertness: Awake/alert Behavior During Therapy: WFL for tasks assessed/performed Overall Cognitive Status: Within  Functional Limits for tasks assessed                                 General Comments: communicated via whispering 2/2 parkinson's       Exercises      General Comments        Pertinent Vitals/Pain Pain Assessment: Faces Pain Score: 3  Faces Pain Scale: Hurts little more Pain Location: R hip Pain Descriptors / Indicators: Aching;Sore;Tender Pain Intervention(s): Limited activity within patient's tolerance;Monitored during session    Home Living                      Prior Function            PT Goals (current goals can now be found in the care plan section) Acute Rehab PT Goals Patient Stated Goal: to improve mobility and have less pain Progress towards PT goals: Progressing toward goals    Frequency    BID      PT Plan Current plan remains appropriate    Co-evaluation              AM-PAC PT "6 Clicks" Mobility   Outcome Measure  Help needed turning from your back to your side while in a flat bed without using bedrails?: A Little Help needed moving from lying on your back to sitting on the side of a flat bed without using bedrails?: A Lot Help needed moving to and from a bed to a chair (including a wheelchair)?: A Lot Help needed standing up from a chair using your arms (e.g., wheelchair or bedside chair)?: A Lot Help needed to walk in hospital room?: Total Help needed climbing 3-5 steps with a railing? : Total 6 Click Score: 11    End of Session Equipment Utilized During Treatment: Gait belt Activity Tolerance: Patient tolerated treatment well Patient left: in bed;with call bell/phone within reach;with bed alarm set;with nursing/sitter in room;with family/visitor present Nurse Communication: Mobility status;Precautions;Weight bearing status PT Visit Diagnosis: Other abnormalities of gait and mobility (R26.89);Muscle weakness (generalized) (M62.81);Repeated falls (R29.6);History of falling (Z91.81);Pain Pain - Right/Left:  Right Pain - part of body: Hip     Time: [id number] PT Time Calculation (min) (ACUTE ONLY): 22 min  Charges:  $Therapeutic Activity: 8-22 mins                     Julaine Fusi PTA 12/22/19, 3:42 PM

## 2019-12-22 NOTE — Progress Notes (Signed)
  Subjective: 2 Days Post-Op Procedure(s) (LRB): ARTHROPLASTY BIPOLAR HIP (HEMIARTHROPLASTY) (Right) Patient reports pain as improved from yesterday.  Patient is well, and has had no acute complaints or problems  Does report some abdominal pain, In/Out Cath done yesterday., 250 mL out. Plan is to go Rehab after hospital stay. Negative for chest pain and shortness of breath Fever: no Gastrointestinal: Negative for nausea and vomiting  Objective: Vital signs in last 24 hours: Temp:  [98.7 F (37.1 C)-99.1 F (37.3 C)] 99.1 F (37.3 C) (04/23 2304) Pulse Rate:  [93-96] 95 (04/23 2304) Resp:  [16-20] 16 (04/23 2304) BP: (129-140)/(50-57) 131/52 (04/23 2304) SpO2:  [95 %-99 %] 95 % (04/23 2304)  Intake/Output from previous day:  Intake/Output Summary (Last 24 hours) at 12/22/2019 0723 Last data filed at 12/22/2019 0525 Gross per 24 hour  Intake 1012.17 ml  Output 925 ml  Net 87.17 ml    Intake/Output this shift: No intake/output data recorded.  Labs: Recent Labs    12/19/19 1547 12/20/19 1845 12/21/19 0312  HGB 12.2 11.2* 9.8*   Recent Labs    12/20/19 1845 12/21/19 0312  WBC 8.8 6.9  RBC 3.72* 3.26*  HCT 34.8* 30.5*  PLT 195 177   Recent Labs    12/19/19 1547 12/21/19 0312  NA 138 138  K 3.9 3.5  CL 104 105  CO2 26 25  BUN 19 12  CREATININE 0.60 0.55  GLUCOSE 104* 108*  CALCIUM 8.7* 8.1*   Recent Labs    12/19/19 1547  INR 0.9   EXAM General - Patient is Alert and Oriented.  Communicating well and asking questions. Extremity - Neurovascular intact Sensation intact distally Dorsiflexion/Plantar flexion intact No cellulitis present Compartment soft Dressing/Incision - clean, dry, scant drainage Motor Function - intact, moving foot and toes well on exam.  Abdomen with mild distention, intact bowel sounds.  Past Medical History:  Diagnosis Date  . Abdominal pain, right upper quadrant   . Allergic rhinitis   . Anxiety   . Depression   .  Esophageal motility disorder   . Fibromyalgia   . Gait disturbance   . GERD (gastroesophageal reflux disease)   . Grief reaction   . Hemorrhoids, internal, thrombosed   . Interstitial cystitis   . Irritable bowel syndrome   . Osteoporosis   . Parkinson disease ([place])   . Restless leg syndrome   . Shingles   . Sjogren's syndrome ([place])    ?  Marland Kitchen Trochanteric bursitis    bilateral  . Vaginitis     Assessment/Plan: 2 Days Post-Op Procedure(s) (LRB): ARTHROPLASTY BIPOLAR HIP (HEMIARTHROPLASTY) (Right) Active Problems:   Esophageal motility disorder   Closed right hip fracture, initial encounter (HCC)   S/P hip hemiarthroplasty  Estimated body mass index is 15.78 kg/m as calculated from the following:   Height as of this encounter: 5\' 5"  (1.651 m).   Weight as of this encounter: 43 kg. Advance diet Up with therapy D/C IV fluids   CBC and BMP from this AM not back yet. Work on [employer], move on to enema today if needed. Continue with therapy.  Will need SNF upon discharge.  Follow-up at [facility] clinic orthopedics in 2 weeks for staple removal and x-rays of the right hip  DVT Prophylaxis - Lovenox, Foot Pumps and TED hose Weight-Bearing as tolerated to right leg  J. Cameron Proud, PA-C Orthopaedic Surgery 12/22/2019, 7:23 AM

## 2019-12-22 NOTE — Progress Notes (Signed)
[place] [facility] [place] NAME: Kaitlyn Church    MR#:  [id number]  DATE OF BIRTH:  1940/11/21  SUBJECTIVE:  Did well for surgery Some hip pain Overall doing ok  REVIEW OF SYSTEMS:   Review of Systems  Constitutional: Negative for chills, fever and weight loss.  HENT: Negative for ear discharge, ear pain and nosebleeds.   Eyes: Negative for blurred vision, pain and discharge.  Respiratory: Negative for sputum production, shortness of breath, wheezing and stridor.   Cardiovascular: Negative for chest pain, palpitations, orthopnea and PND.  Gastrointestinal: Negative for abdominal pain, diarrhea, nausea and vomiting.  Genitourinary: Negative for frequency and urgency.  Musculoskeletal: Positive for joint pain and myalgias.  Neurological: Positive for weakness. Negative for sensory change, speech change and focal weakness.  Psychiatric/Behavioral: Negative for depression and hallucinations. The patient is not nervous/anxious.    Tolerating Diet:soft diet Tolerating PT: SNF  DRUG ALLERGIES:   Allergies  Allergen Reactions  . Lidocaine Other (See Comments)  . Other Other (See Comments)    darvocet. darvocet - HALLUCINATIONS  . Propoxyphene Other (See Comments)    Other Reaction: hallucinations, weird dreams  . Ascorbic Acid Other (See Comments)  . Ascorbic Acid Other (See Comments)  . Azithromycin Other (See Comments)    Other Reaction: sore, swollen tongue  . Basil Oil Other (See Comments)  . Calcium Other (See Comments)  . Celecoxib Other (See Comments)  . Cephalexin     REACTION: felt tired but tolerated  . Dexamethasone Other (See Comments)    Other Reaction: insomnia, chest spasms  . Doxycycline Hives  . Itraconazole Other (See Comments)  . Meperidine Hcl Other (See Comments)  . Mirabegron Other (See Comments)    intolerant  . Morphine Hives  . Morphine Sulfate   . Nitrofurantoin Other (See Comments)    Swollen lip and  tongue Blisters on tongue  . [place] Oil Other (See Comments)  . Pantoprazole Sodium Other (See Comments)    REACTION: diarrhea REACTION: diarrhea  . Penicillins Other (See Comments)    Has patient had a PCN reaction causing immediate rash, facial/tongue/throat swelling, SOB or lightheadedness with hypotension: Unknown Has patient had a PCN reaction causing severe rash involving mucus membranes or skin necrosis: Unknown Has patient had a PCN reaction that required hospitalization: Unknown Has patient had a PCN reaction occurring within the last 10 years: Unknown If all of the above answers are "NO", then may proceed with Cephalosporin use.   Marland Kitchen Propoxyphene Hcl   . Sulfa Antibiotics Other (See Comments)    Swollen lip and tongue Blisters on tongue  . Sulfamethoxazole-Trimethoprim Other (See Comments)  . Thimerosal Other (See Comments)    Eye burning    VITALS:  Blood pressure 138/78, pulse 85, temperature 98.5 F (36.9 C), temperature source Oral, resp. rate 16, height (S) 5\' 5"  (1.651 m), weight (S) 43 kg, SpO2 98 %.  PHYSICAL EXAMINATION:   Physical Exam  GENERAL:  79 y.o.-year-old patient lying in the bed with no acute distress. Thin, fraile EYES: Pupils equal, round, reactive to light and accommodation. No scleral icterus.   HEENT: Head atraumatic, normocephalic. Oropharynx and nasopharynx clear.  NECK:  Supple, no jugular venous distention. No thyroid enlargement, no tenderness.  LUNGS: Normal breath sounds bilaterally, no wheezing, rales, rhonchi. No use of accessory muscles of respiration.  CARDIOVASCULAR: S1, S2 normal. No murmurs, rubs, or gallops.  ABDOMEN: Soft, nontender, nondistended. Bowel sounds present. No organomegaly or mass.  EXTREMITIES: No cyanosis, clubbing or edema b/l.   Right hip surgical dressing_ NEUROLOGIC: Cranial nerves II through XII are intact. No focal Motor or sensory deficits b/l.  Decreased ROM right LE. gen weakness+ with  deconditioning PSYCHIATRIC:  patient is alert and oriented x 3.  SKIN: No obvious rash, lesion, or ulcer.   LABORATORY PANEL:  CBC Recent Labs  Lab 12/22/19 0722  WBC 8.0  HGB 9.3*  HCT 28.1*  PLT 179    Chemistries  Recent Labs  Lab 12/21/19 0312  NA 138  K 3.5  CL 105  CO2 25  GLUCOSE 108*  BUN 12  CREATININE 0.55  CALCIUM 8.1*   Cardiac Enzymes No results for input(s): TROPONINI in the last 168 hours. RADIOLOGY:  DG Pelvis 1-2 Views  Result Date: 12/20/2019 CLINICAL DATA:  Postoperative radiograph, right hip hemiarthroplasty EXAM: PELVIS - 1-2 VIEW COMPARISON:  Intraoperative radiograph the same day, preoperative radiograph 12/19/2019 FINDINGS: Interval resection of the right femoral head neck with placement of the articulating right femoral stem of a right hip hemiarthroplasty. Expected postsurgical soft tissue changes are noted with overlying skin staples, soft tissue and intra-articular gas. Alignment is within expected normals. No acute complication is evident. IMPRESSION: Expected postoperative appearance following right hip hemiarthroplasty. Electronically Signed   By: Lovena Le M.D.   On: 12/20/2019 17:40   DG Pelvis Portable  Result Date: 12/20/2019 CLINICAL DATA:  Intraoperative fluoroscopy, right hip hemiarthroplasty EXAM: PORTABLE PELVIS 1-2 VIEWS COMPARISON:  Radiograph 07/07/2017 FINDINGS: Intraoperative fluoroscopic image includes portions of the lower pelvis and proximal femora. Images depict interval resection of the right femoral head and neck with placement of a metallic femoral stem prior to the installation of the articular component. Expected operative soft tissue changes are noted with swelling and intra-articular gas. IMPRESSION: Intraoperative fluoroscopic image during right hip hemiarthroplasty without acute complication. Electronically Signed   By: Lovena Le M.D.   On: 12/20/2019 17:38   DG HIP PORT UNILAT WITH PELVIS 1V RIGHT  Result Date:  12/21/2019 CLINICAL DATA:  Possible dislocation. EXAM: DG HIP (WITH OR WITHOUT PELVIS) 1V PORT RIGHT COMPARISON:  Prior study 12/20/2019. FINDINGS: Right hip replacement. Hardware intact. Anatomic alignment. No evidence of dislocation on one view. Postsurgical changes noted over the soft tissues about the right hip. IMPRESSION: Right hip replacement. Hardware intact. Anatomic alignment. No evidence of dislocation on one view. Electronically Signed   By: Marcello Moores  Register   On: 12/21/2019 14:32   DG HIP OPERATIVE UNILAT W OR W/O PELVIS RIGHT  Result Date: 12/20/2019 CLINICAL DATA:  Right hip fracture EXAM: OPERATIVE RIGHT HIP (WITH PELVIS IF PERFORMED) 2 VIEWS TECHNIQUE: Fluoroscopic spot image(s) were submitted for interpretation post-operatively. COMPARISON:  12/19/2019 FINDINGS: Two fluoroscopic images are obtained during the performance of the procedure and are provided for interpretation only. The subcapital right femoral neck fracture seen previously demonstrates significant interval displacement with superior migration of the distal fracture fragment. Femoral head remains anatomically aligned with the acetabulum. FLUOROSCOPY TIME:  2 second IMPRESSION: 1. Intraoperative exam as above. Electronically Signed   By: Randa Ngo M.D.   On: 12/20/2019 16:09   ASSESSMENT AND PLAN:   Alisah Ferner  is a 79 y.o. female with a known history of Parkinsons disease is wheelchair-bound, anxiety/depression, esophageal motility disorder,GERD, fibromyalgia comes to the emergency room from after she had slid off her wheelchair and started having right-sided pain.  1.Right sub capital femoral neck fracture status post mechanical fall -patient has Parkinson's disease. She is  wheelchair-bound. She had a mechanical fall  at [place] -PRN pain meds -orthopedic consultation with Dr. Posey Pronto- patient is status post right hip hemi arthroplasty POD #2 -patient does not have history of CAD. Denies any chest pain  shortness of breath. -PT recommends rehab  2. Parkinson's disease -cont sinemet -patient is wheelchair-bound however does short steps with help - She follows with neurology at [facility]  3. Esophageal motility disorder -soft diet -consider ST if needed  4.Fibromylagia -resume home meds  5. DVT prophylaxis SCD for now till surgery is done  Family Communication : none Consults : orthopedic Dr. Leim Fabry Code Status : DNR prior to admission. This was discussed with patient in the ER DVT prophylaxis :SCD Procedures: right hip hemi arthroplasty on 4/22   Status is: Inpatient Dispo: The patient is from: [place]              Anticipated d/c is to: [facility]              Anticipated d/c date is: 4/26  awaiting insurance authorization               Patient currently stable.   TOTAL TIME TAKING CARE OF THIS PATIENT: *25* minutes.  >50% time spent on counselling and coordination of care  Note: This dictation was prepared with Dragon dictation along with smaller phrase technology. Any transcriptional errors that result from this process are unintentional.  Fritzi Mandes M.D    [facility]   CC: Primary care physician; Adin Hector, MDPatient ID: Kaitlyn Church, female   DOB: [birth year], 79 y.o.   MRN: [id number]

## 2019-12-23 LAB — CBC
HCT: 29 % — ABNORMAL LOW (ref 36.0–46.0)
Hemoglobin: 9.6 g/dL — ABNORMAL LOW (ref 12.0–15.0)
MCH: 30.2 pg (ref 26.0–34.0)
MCHC: 33.1 g/dL (ref 30.0–36.0)
MCV: 91.2 fL (ref 80.0–100.0)
Platelets: 219 10*3/uL (ref 150–400)
RBC: 3.18 MIL/uL — ABNORMAL LOW (ref 3.87–5.11)
RDW: 13.1 % (ref 11.5–15.5)
WBC: 7.9 10*3/uL (ref 4.0–10.5)
nRBC: 0 % (ref 0.0–0.2)

## 2019-12-23 NOTE — Progress Notes (Signed)
Physical Therapy Treatment Patient Details Name: Kaitlyn Church MRN: [id number] DOB: 1941-01-17 Today's Date: 12/23/2019    History of Present Illness Pt is a 79 y.o. female presenting to hospital 4/21 s/p fall out of w/c onto R hip.  Imaging showing acute mildly impacted subcapital fx of proximal R femur.  S/p R hip hemiarthroplasty 4/22.  PMH includes B nasal bone fx's (3/18) s/p fall, Parkinson's, anxiety, depression, esophageal motility disorder, fibromyalgia, chronic HA, IBS, and RLS.    PT Comments    Ready for session.  Participated in exercises as described below.  To EOB with mod a x 1.  Once sitting, generally steady.  Stand pivot to bedside commode with mod a x 1.  Pt able to void and BM given increased time.  Tech in to assist with care.  She is able to stand from commode with mod a x 1 and pivot 180 degrees to recliner.  While she uses my forearms for support, she is able to tun and move her feet mostly bearing her own weight.  Given increased time she does quite well.  Remained in recliner for breakfast.     Follow Up Recommendations  SNF     Equipment Recommendations  3in1 (PT);Wheelchair (measurements PT);Wheelchair cushion (measurements PT)    Recommendations for Other Services       Precautions / Restrictions Precautions Precautions: Fall;Posterior Hip Restrictions Weight Bearing Restrictions: Yes RLE Weight Bearing: Weight bearing as tolerated    Mobility  Bed Mobility Overal bed mobility: Needs Assistance Bed Mobility: Supine to Sit     Supine to sit: Mod assist;HOB elevated     General bed mobility comments: Assist for trunk and maintain hip precautions  Transfers Overall transfer level: Needs assistance Equipment used: 1 person hand held assist Transfers: Sit to/from [employer] Transfers Sit to Stand: Mod assist Stand pivot transfers: Mod assist       General transfer comment: stnd pivot transfer to commode and recliner.  increased  time given but she is able to move feet with support on my arms and does quite well with transfer.  Ambulation/Gait                 Stairs             Wheelchair Mobility    Modified Rankin (Stroke Patients Only)       Balance Overall balance assessment: Needs assistance Sitting-balance support: No upper extremity supported;Feet supported Sitting balance-Leahy Scale: Good Sitting balance - Comments: tolerates sitting position well with supervision   Standing balance support: Bilateral upper extremity supported Standing balance-Leahy Scale: Poor Standing balance comment: pt requiring B UE support for static standing balance                            Cognition Arousal/Alertness: Awake/alert Behavior During Therapy: WFL for tasks assessed/performed Overall Cognitive Status: Within Functional Limits for tasks assessed                                 General Comments: communicated via whispering 2/2 parkinson's       Exercises Other Exercises Other Exercises: RLE AAROM for ankle pumps, heel slides, ab/add and SLR x 10    General Comments        Pertinent Vitals/Pain Pain Assessment: Faces Faces Pain Scale: Hurts little more Pain Location: R hip Pain Descriptors / Indicators: Aching;Sore;Tender  Pain Intervention(s): Limited activity within patient's tolerance;Monitored during session;Repositioned    Home Living                      Prior Function            PT Goals (current goals can now be found in the care plan section) Progress towards PT goals: Progressing toward goals    Frequency    BID      PT Plan Current plan remains appropriate    Co-evaluation              AM-PAC PT "6 Clicks" Mobility   Outcome Measure  Help needed turning from your back to your side while in a flat bed without using bedrails?: A Little Help needed moving from lying on your back to sitting on the side of a flat bed  without using bedrails?: A Lot Help needed moving to and from a bed to a chair (including a wheelchair)?: A Lot Help needed standing up from a chair using your arms (e.g., wheelchair or bedside chair)?: A Lot Help needed to walk in hospital room?: Total Help needed climbing 3-5 steps with a railing? : Total 6 Click Score: 11    End of Session Equipment Utilized During Treatment: Gait belt Activity Tolerance: Patient tolerated treatment well Patient left: in chair;with call bell/phone within reach;with chair alarm set Nurse Communication: Mobility status;Precautions;Weight bearing status Pain - Right/Left: Right Pain - part of body: Hip     Time: 0824-0902 PT Time Calculation (min) (ACUTE ONLY): 38 min  Charges:  $Therapeutic Exercise: 8-22 mins $Therapeutic Activity: 23-37 mins                    Chesley Noon, PTA 12/23/19, 9:48 AM

## 2019-12-23 NOTE — Progress Notes (Signed)
[place] [facility] [place] NAME: Kaitlyn Church    MR#:  [id number]  DATE OF BIRTH:  08/12/1941  SUBJECTIVE:  Did well for surgery Some hip pain Overall doing ok  REVIEW OF SYSTEMS:   Review of Systems  Constitutional: Negative for chills, fever and weight loss.  HENT: Negative for ear discharge, ear pain and nosebleeds.   Eyes: Negative for blurred vision, pain and discharge.  Respiratory: Negative for sputum production, shortness of breath, wheezing and stridor.   Cardiovascular: Negative for chest pain, palpitations, orthopnea and PND.  Gastrointestinal: Negative for abdominal pain, diarrhea, nausea and vomiting.  Genitourinary: Negative for frequency and urgency.  Musculoskeletal: Positive for joint pain and myalgias.  Neurological: Positive for weakness. Negative for sensory change, speech change and focal weakness.  Psychiatric/Behavioral: Negative for depression and hallucinations. The patient is not nervous/anxious.    Tolerating Diet:soft diet Tolerating PT: SNF  DRUG ALLERGIES:   Allergies  Allergen Reactions  . Lidocaine Other (See Comments)  . Other Other (See Comments)    darvocet. darvocet - HALLUCINATIONS  . Propoxyphene Other (See Comments)    Other Reaction: hallucinations, weird dreams  . Ascorbic Acid Other (See Comments)  . Ascorbic Acid Other (See Comments)  . Azithromycin Other (See Comments)    Other Reaction: sore, swollen tongue  . Basil Oil Other (See Comments)  . Calcium Other (See Comments)  . Celecoxib Other (See Comments)  . Cephalexin     REACTION: felt tired but tolerated  . Dexamethasone Other (See Comments)    Other Reaction: insomnia, chest spasms  . Doxycycline Hives  . Itraconazole Other (See Comments)  . Meperidine Hcl Other (See Comments)  . Mirabegron Other (See Comments)    intolerant  . Morphine Hives  . Morphine Sulfate   . Nitrofurantoin Other (See Comments)    Swollen lip and  tongue Blisters on tongue  . Origanum Oil Other (See Comments)  . Pantoprazole Sodium Other (See Comments)    REACTION: diarrhea REACTION: diarrhea  . Penicillins Other (See Comments)    Has patient had a PCN reaction causing immediate rash, facial/tongue/throat swelling, SOB or lightheadedness with hypotension: Unknown Has patient had a PCN reaction causing severe rash involving mucus membranes or skin necrosis: Unknown Has patient had a PCN reaction that required hospitalization: Unknown Has patient had a PCN reaction occurring within the last 10 years: Unknown If all of the above answers are "NO", then may proceed with Cephalosporin use.   Marland Kitchen Propoxyphene Hcl   . Sulfa Antibiotics Other (See Comments)    Swollen lip and tongue Blisters on tongue  . Sulfamethoxazole-Trimethoprim Other (See Comments)  . Thimerosal Other (See Comments)    Eye burning    VITALS:  Blood pressure 132/68, pulse 86, temperature 98 F (36.7 C), temperature source Oral, resp. rate 16, height (S) 5\' 5"  (1.651 m), weight (S) 43 kg, SpO2 93 %.  PHYSICAL EXAMINATION:   Physical Exam  GENERAL:  79 y.o.-year-old patient lying in the bed with no acute distress. Thin, fraile EYES: Pupils equal, round, reactive to light and accommodation. No scleral icterus.   HEENT: Head atraumatic, normocephalic. Oropharynx and nasopharynx clear.  NECK:  Supple, no jugular venous distention. No thyroid enlargement, no tenderness.  LUNGS: Normal breath sounds bilaterally, no wheezing, rales, rhonchi. No use of accessory muscles of respiration.  CARDIOVASCULAR: S1, S2 normal. No murmurs, rubs, or gallops.  ABDOMEN: Soft, nontender, nondistended. Bowel sounds present. No organomegaly or mass.  EXTREMITIES: No cyanosis, clubbing or edema b/l.   Right hip surgical dressing_ NEUROLOGIC: Cranial nerves II through XII are intact. No focal Motor or sensory deficits b/l.  Decreased ROM right LE. gen weakness+ with  deconditioning PSYCHIATRIC:  patient is alert and oriented x 3.  SKIN: No obvious rash, lesion, or ulcer.   LABORATORY PANEL:  CBC Recent Labs  Lab 12/23/19 0400  WBC 7.9  HGB 9.6*  HCT 29.0*  PLT 219    Chemistries  Recent Labs  Lab 12/21/19 0312  NA 138  K 3.5  CL 105  CO2 25  GLUCOSE 108*  BUN 12  CREATININE 0.55  CALCIUM 8.1*   Cardiac Enzymes No results for input(s): TROPONINI in the last 168 hours. RADIOLOGY:  No results found. ASSESSMENT AND PLAN:   Kaitlyn Church  is a 79 y.o. female with a known history of Parkinsons disease is wheelchair-bound, anxiety/depression, esophageal motility disorder,GERD, fibromyalgia comes to the emergency room from after she had slid off her wheelchair and started having right-sided pain.  1.Right sub capital femoral neck fracture status post mechanical fall -patient has Parkinson's disease. She is wheelchair-bound. She had a mechanical fall  at [place] -PRN pain meds -orthopedic consultation with Dr. Posey Pronto- patient is status post right hip hemi arthroplasty POD # 3 -patient does not have history of CAD. Denies any chest pain shortness of breath. -PT recommends rehab  2. Parkinson's disease -cont sinemet -patient is wheelchair-bound however does short steps with help - She follows with neurology at [facility]  3. Esophageal motility disorder -soft diet -consider ST if needed  4.Fibromylagia -resume home meds  5. DVT prophylaxis SCD for now till surgery is done  Family Communication : none Consults : orthopedic Dr. Leim Fabry Code Status : DNR prior to admission. This was discussed with patient in the ER DVT prophylaxis :SCD Procedures: right hip hemi arthroplasty on 4/22   Status is: Inpatient Dispo: The patient is from: [place]              Anticipated d/c is to: [facility]              Anticipated d/c date is: 4/26  awaiting insurance authorization               Patient currently  stable.   TOTAL TIME TAKING CARE OF THIS PATIENT: *25* minutes.  >50% time spent on counselling and coordination of care  Note: This dictation was prepared with Dragon dictation along with smaller phrase technology. Any transcriptional errors that result from this process are unintentional.  Fritzi Mandes M.D    [facility]   CC: Primary care physician; Adin Hector, MDPatient ID: [name], female   DOB: 27-Sep-1940, 79 y.o.   MRN: [id number]

## 2019-12-23 NOTE — Progress Notes (Signed)
  Subjective: 3 Days Post-Op Procedure(s) (LRB): ARTHROPLASTY BIPOLAR HIP (HEMIARTHROPLASTY) (Right) Patient reports minimal pain this morning. Patient is well, and has had no acute complaints or problems  Plan is to go Rehab after hospital stay. Negative for chest pain and shortness of breath Fever: no Gastrointestinal: Negative for nausea and vomiting Patient states that she did have a small bowel movement.  Objective: Vital signs in last 24 hours: Temp:  [98.3 F (36.8 C)-98.5 F (36.9 C)] 98.3 F (36.8 C) (04/24 2312) Pulse Rate:  [85-92] 92 (04/24 2312) Resp:  [17] 17 (04/24 2312) BP: (136-138)/(58-78) 136/58 (04/24 2312) SpO2:  [98 %-99 %] 99 % (04/24 2312)  Intake/Output from previous day:  Intake/Output Summary (Last 24 hours) at 12/23/2019 0734 Last data filed at 12/23/2019 0500 Gross per 24 hour  Intake --  Output 400 ml  Net -400 ml    Intake/Output this shift: No intake/output data recorded.  Labs: Recent Labs    12/20/19 1845 12/21/19 0312 12/22/19 0722 12/23/19 0400  HGB 11.2* 9.8* 9.3* 9.6*   Recent Labs    12/22/19 0722 12/23/19 0400  WBC 8.0 7.9  RBC 3.06* 3.18*  HCT 28.1* 29.0*  PLT 179 219   Recent Labs    12/21/19 0312  NA 138  K 3.5  CL 105  CO2 25  BUN 12  CREATININE 0.55  GLUCOSE 108*  CALCIUM 8.1*   No results for input(s): LABPT, INR in the last 72 hours. EXAM General - Patient is Alert and Oriented.  Communicating well and asking questions. Extremity - Neurovascular intact Sensation intact distally Dorsiflexion/Plantar flexion intact No cellulitis present Compartment soft Dressing/Incision - clean, dry, scant drainage Motor Function - intact, moving foot and toes well on exam.  Abdomen with improved distention, intact bowel sounds.  Past Medical History:  Diagnosis Date  . Abdominal pain, right upper quadrant   . Allergic rhinitis   . Anxiety   . Depression   . Esophageal motility disorder   . Fibromyalgia   .  Gait disturbance   . GERD (gastroesophageal reflux disease)   . Grief reaction   . Hemorrhoids, internal, thrombosed   . Interstitial cystitis   . Irritable bowel syndrome   . Osteoporosis   . Parkinson disease ([place])   . Restless leg syndrome   . Shingles   . Sjogren's syndrome ([place])    ?  Marland Kitchen Trochanteric bursitis    bilateral  . Vaginitis    Assessment/Plan: 3 Days Post-Op Procedure(s) (LRB): ARTHROPLASTY BIPOLAR HIP (HEMIARTHROPLASTY) (Right) Active Problems:   Esophageal motility disorder   Closed right hip fracture, initial encounter (HCC)   S/P hip hemiarthroplasty  Estimated body mass index is 15.78 kg/m as calculated from the following:   Height as of this encounter: 5\' 5"  (1.651 m).   Weight as of this encounter: 43 kg. Advance diet Up with therapy D/C IV fluids   Labs reviewed this AM. Hg 9.5 this AM. Reports she had a small bowel movement. Continue with therapy.  Will need SNF upon discharge.  Follow-up at [facility] clinic orthopedics in 2 weeks for staple removal and x-rays of the right hip Continue Lovenox 40mg  daily for 14 days following discharge.  DVT Prophylaxis - Lovenox, Foot Pumps and TED hose Weight-Bearing as tolerated to right leg  J. Cameron Proud, PA-C Orthopaedic Surgery 12/23/2019, 7:34 AM

## 2019-12-24 DIAGNOSIS — F419 Anxiety disorder, unspecified: Secondary | ICD-10-CM | POA: Diagnosis not present

## 2019-12-24 DIAGNOSIS — R457 State of emotional shock and stress, unspecified: Secondary | ICD-10-CM | POA: Diagnosis not present

## 2019-12-24 DIAGNOSIS — F3341 Major depressive disorder, recurrent, in partial remission: Secondary | ICD-10-CM | POA: Diagnosis not present

## 2019-12-24 DIAGNOSIS — R2689 Other abnormalities of gait and mobility: Secondary | ICD-10-CM | POA: Diagnosis not present

## 2019-12-24 DIAGNOSIS — G2581 Restless legs syndrome: Secondary | ICD-10-CM | POA: Diagnosis not present

## 2019-12-24 DIAGNOSIS — W19XXXA Unspecified fall, initial encounter: Secondary | ICD-10-CM | POA: Diagnosis not present

## 2019-12-24 DIAGNOSIS — S72001A Fracture of unspecified part of neck of right femur, initial encounter for closed fracture: Secondary | ICD-10-CM | POA: Diagnosis not present

## 2019-12-24 DIAGNOSIS — M797 Fibromyalgia: Secondary | ICD-10-CM | POA: Diagnosis not present

## 2019-12-24 DIAGNOSIS — M25551 Pain in right hip: Secondary | ICD-10-CM | POA: Diagnosis not present

## 2019-12-24 DIAGNOSIS — K219 Gastro-esophageal reflux disease without esophagitis: Secondary | ICD-10-CM | POA: Diagnosis not present

## 2019-12-24 DIAGNOSIS — G20 Parkinson's disease: Secondary | ICD-10-CM | POA: Diagnosis not present

## 2019-12-24 DIAGNOSIS — Z7401 Bed confinement status: Secondary | ICD-10-CM | POA: Diagnosis not present

## 2019-12-24 DIAGNOSIS — R488 Other symbolic dysfunctions: Secondary | ICD-10-CM | POA: Diagnosis not present

## 2019-12-24 DIAGNOSIS — M3500 Sicca syndrome, unspecified: Secondary | ICD-10-CM | POA: Diagnosis not present

## 2019-12-24 DIAGNOSIS — I959 Hypotension, unspecified: Secondary | ICD-10-CM | POA: Diagnosis not present

## 2019-12-24 DIAGNOSIS — K224 Dyskinesia of esophagus: Secondary | ICD-10-CM | POA: Diagnosis not present

## 2019-12-24 DIAGNOSIS — M810 Age-related osteoporosis without current pathological fracture: Secondary | ICD-10-CM | POA: Diagnosis not present

## 2019-12-24 DIAGNOSIS — F329 Major depressive disorder, single episode, unspecified: Secondary | ICD-10-CM | POA: Diagnosis not present

## 2019-12-24 DIAGNOSIS — M6281 Muscle weakness (generalized): Secondary | ICD-10-CM | POA: Diagnosis not present

## 2019-12-24 DIAGNOSIS — E441 Mild protein-calorie malnutrition: Secondary | ICD-10-CM | POA: Diagnosis not present

## 2019-12-24 DIAGNOSIS — S72011D Unspecified intracapsular fracture of right femur, subsequent encounter for closed fracture with routine healing: Secondary | ICD-10-CM | POA: Diagnosis not present

## 2019-12-24 DIAGNOSIS — M2550 Pain in unspecified joint: Secondary | ICD-10-CM | POA: Diagnosis not present

## 2019-12-24 DIAGNOSIS — R1313 Dysphagia, pharyngeal phase: Secondary | ICD-10-CM | POA: Diagnosis not present

## 2019-12-24 DIAGNOSIS — K589 Irritable bowel syndrome without diarrhea: Secondary | ICD-10-CM | POA: Diagnosis not present

## 2019-12-24 DIAGNOSIS — R531 Weakness: Secondary | ICD-10-CM | POA: Diagnosis not present

## 2019-12-24 DIAGNOSIS — W19XXXD Unspecified fall, subsequent encounter: Secondary | ICD-10-CM | POA: Diagnosis not present

## 2019-12-24 DIAGNOSIS — Z96641 Presence of right artificial hip joint: Secondary | ICD-10-CM | POA: Diagnosis not present

## 2019-12-24 LAB — SURGICAL PATHOLOGY

## 2019-12-24 LAB — RESPIRATORY PANEL BY RT PCR (FLU A&B, COVID)
Influenza A by PCR: NEGATIVE
Influenza B by PCR: NEGATIVE
SARS Coronavirus 2 by RT PCR: NEGATIVE

## 2019-12-24 NOTE — Progress Notes (Signed)
Pt d/c to [facility], she was just picked up my EMS.  All belongings packed and taken at time of d/c.  NAD noted VS WNL.  Dressing to R hip dry and intact with old drainage present.

## 2019-12-24 NOTE — Progress Notes (Signed)
Subjective: 4 Days Post-Op Procedure(s) (LRB): ARTHROPLASTY BIPOLAR HIP (HEMIARTHROPLASTY) (Right) Patient reports minimal pain this morning. Patient is well, and has had no acute complaints or problems  Plan is to go Rehab after hospital stay. Negative for chest pain and shortness of breath Fever: no Gastrointestinal: Negative for nausea and vomiting Patient states that she did have a small bowel movement.  Objective: Vital signs in last 24 hours: Temp:  [98 F (36.7 C)-99.1 F (37.3 C)] 99.1 F (37.3 C) (04/25 2357) Pulse Rate:  [86-96] 96 (04/25 2357) Resp:  [16-18] 18 (04/25 2357) BP: (121-132)/(52-68) 121/52 (04/25 2357) SpO2:  [93 %-96 %] 96 % (04/25 2357)  Intake/Output from previous day:  Intake/Output Summary (Last 24 hours) at 12/24/2019 0743 Last data filed at 12/24/2019 0447 Gross per 24 hour  Intake --  Output 500 ml  Net -500 ml    Intake/Output this shift: No intake/output data recorded.  Labs: Recent Labs    12/22/19 0722 12/23/19 0400  HGB 9.3* 9.6*   Recent Labs    12/22/19 0722 12/23/19 0400  WBC 8.0 7.9  RBC 3.06* 3.18*  HCT 28.1* 29.0*  PLT 179 219   No results for input(s): NA, K, CL, CO2, BUN, CREATININE, GLUCOSE, CALCIUM in the last 72 hours. No results for input(s): LABPT, INR in the last 72 hours. EXAM General - Patient is Alert and Oriented.  Communicating well and asking questions. Extremity - Neurovascular intact Sensation intact distally Dorsiflexion/Plantar flexion intact No cellulitis present Compartment soft Dressing/Incision - clean, dry, scant drainage Motor Function - intact, moving foot and toes well on exam.  Abdomen is soft with intact bowel sounds.  Past Medical History:  Diagnosis Date  . Abdominal pain, right upper quadrant   . Allergic rhinitis   . Anxiety   . Depression   . Esophageal motility disorder   . Fibromyalgia   . Gait disturbance   . GERD (gastroesophageal reflux disease)   . Grief reaction    . Hemorrhoids, internal, thrombosed   . Interstitial cystitis   . Irritable bowel syndrome   . Osteoporosis   . Parkinson disease ([place])   . Restless leg syndrome   . Shingles   . Sjogren's syndrome ([place])    ?  Marland Kitchen Trochanteric bursitis    bilateral  . Vaginitis    Assessment/Plan: 4 Days Post-Op Procedure(s) (LRB): ARTHROPLASTY BIPOLAR HIP (HEMIARTHROPLASTY) (Right) Active Problems:   Esophageal motility disorder   Closed right hip fracture, initial encounter (HCC)   S/P hip hemiarthroplasty  Estimated body mass index is 15.78 kg/m as calculated from the following:   Height as of this encounter: 5\' 5"  (1.651 m).   Weight as of this encounter: 43 kg. Advance diet Up with therapy   Continue with PT. Patient has had a small bowel movement. Discharge to SNF when medically appropriate and following   Follow-up at [facility] clinic orthopedics in 2 weeks for staple removal and x-rays of the right hip Continue Lovenox 40mg  daily for 14 days following discharge.  DVT Prophylaxis - Lovenox, Foot Pumps and TED hose Weight-Bearing as tolerated to right leg  J. Cameron Proud, PA-C Orthopaedic Surgery 12/24/2019, 7:43 AM

## 2019-12-24 NOTE — TOC Progression Note (Signed)
Transition of Care [facility]) - Progression Note    Patient Details  Name: Kaitlyn Church MRN: [id number] Date of Birth: 12/19/1940  Transition of Care [facility]) CM/SW [place], RN Phone Number: 12/24/2019, 10:31 AM  Clinical Narrative:     Checked on the PASSR and the number is [id number], Provided the information to [place] at [facility], the patient will go to Room 501  Expected Discharge Plan: [place] Barriers to Discharge: Continued Medical Work up  Expected Discharge Plan and Services Expected Discharge Plan: [place]   Discharge Planning Services: CM Consult Post Acute Care Choice: [place] Living arrangements for the past 2 months: [place] Expected Discharge Date: 12/24/19                                     Social Determinants of Health (SDOH) Interventions    Readmission Risk Interventions No flowsheet data found.

## 2019-12-24 NOTE — Plan of Care (Signed)
  Problem: Education: Goal: Knowledge of General Education information will improve Description Including pain rating scale, medication(s)/side effects and non-pharmacologic comfort measures Outcome: Progressing   Problem: Clinical Measurements: Goal: Will remain free from infection Outcome: Progressing Goal: Respiratory complications will improve Outcome: Progressing Goal: Cardiovascular complication will be avoided Outcome: Progressing   

## 2019-12-24 NOTE — Care Management Important Message (Signed)
Important Message  Patient Details  Name: Kaitlyn Church MRN: [id number] Date of Birth: [birth year]   Medicare Important Message Given:  Yes     Juliann Pulse A Silva Aamodt 12/24/2019, 10:50 AM

## 2019-12-24 NOTE — Progress Notes (Signed)
Physical Therapy Treatment Patient Details Name: Kaitlyn Church MRN: [id number] DOB: [birth year] Today's Date: 12/24/2019    History of Present Illness Pt is a 79 y.o. female presenting to hospital 4/21 s/p fall out of w/c onto R hip.  Imaging showing acute mildly impacted subcapital fx of proximal R femur.  S/p R hip hemiarthroplasty 4/22.  PMH includes B nasal bone fx's (3/18) s/p fall, Parkinson's, anxiety, depression, esophageal motility disorder, fibromyalgia, chronic HA, IBS, and RLS.    PT Comments    Pt ready for session.  Asking to use bathroom.  Participated in exercises as described below.  To EOB with mod a x 1.  She is able to sit unsupported with supervision.  Pt stated she prefers stand pivot transfer at this point vs RW.  Stnd pivot to commode at bedside with min/mod a x 1 and increased time but able to step with support of Probation officer.  To commode and tech in to attend to self care while she stood and transferred back to bed.  Overall transfers improved today and pain not limiting mobility.  Anticipate transfer to SNF this pm so she chose to return to bed vs remain up in chair.   Follow Up Recommendations  SNF     Equipment Recommendations  3in1 (PT);Wheelchair (measurements PT);Wheelchair cushion (measurements PT)    Recommendations for Other Services       Precautions / Restrictions Precautions Precautions: Fall;Posterior Hip Restrictions Weight Bearing Restrictions: Yes RLE Weight Bearing: Weight bearing as tolerated    Mobility  Bed Mobility Overal bed mobility: Needs Assistance Bed Mobility: Supine to Sit;Sit to Supine     Supine to sit: Mod assist;HOB elevated Sit to supine: Mod assist      Transfers Overall transfer level: Needs assistance Equipment used: 1 person hand held assist Transfers: Sit to/from [employer] Transfers Sit to Stand: Mod assist;Min assist Stand pivot transfers: Mod assist;Min assist       General transfer comment:  prefers stand pivot vs RW  Ambulation/Gait                 Stairs             Wheelchair Mobility    Modified Rankin (Stroke Patients Only)       Balance Overall balance assessment: Needs assistance Sitting-balance support: No upper extremity supported;Feet supported Sitting balance-Leahy Scale: Good     Standing balance support: Bilateral upper extremity supported Standing balance-Leahy Scale: Poor                              Cognition Arousal/Alertness: Awake/alert Behavior During Therapy: WFL for tasks assessed/performed Overall Cognitive Status: Within Functional Limits for tasks assessed                                 General Comments: communicated via whispering 2/2 parkinson's       Exercises Other Exercises Other Exercises: RLE AAROM for ankle pumps, heel slides, ab/add and SLR 2 x 10    General Comments        Pertinent Vitals/Pain Pain Assessment: Faces Faces Pain Scale: Hurts a little bit Pain Location: R hip Pain Descriptors / Indicators: Aching;Sore;Tender Pain Intervention(s): Limited activity within patient's tolerance;Monitored during session;Premedicated before session    Home Living  Prior Function            PT Goals (current goals can now be found in the care plan section) Progress towards PT goals: Progressing toward goals    Frequency    BID      PT Plan Current plan remains appropriate    Co-evaluation              AM-PAC PT "6 Clicks" Mobility   Outcome Measure  Help needed turning from your back to your side while in a flat bed without using bedrails?: A Little Help needed moving from lying on your back to sitting on the side of a flat bed without using bedrails?: A Lot Help needed moving to and from a bed to a chair (including a wheelchair)?: A Lot Help needed standing up from a chair using your arms (e.g., wheelchair or bedside chair)?: A  Lot Help needed to walk in hospital room?: Total Help needed climbing 3-5 steps with a railing? : Total 6 Click Score: 11    End of Session Equipment Utilized During Treatment: Gait belt Activity Tolerance: Patient tolerated treatment well Patient left: in bed;with call bell/phone within reach;with bed alarm set Nurse Communication: Mobility status;Precautions;Weight bearing status Pain - Right/Left: Right Pain - part of body: Hip     Time: [id number] PT Time Calculation (min) (ACUTE ONLY): 38 min  Charges:  $Therapeutic Exercise: 8-22 mins $Therapeutic Activity: 23-37 mins                    Chesley Noon, PTA 12/24/19, 12:30 PM

## 2019-12-24 NOTE — Discharge Summary (Signed)
[place] [facility] [place] NAME: Kaitlyn Church    MR#:  [id number]  DATE OF BIRTH:  1940/09/22  DATE OF ADMISSION:  12/19/2019 ADMITTING PHYSICIAN: Kaitlyn Mandes, MD  DATE OF DISCHARGE: 12/24/2019  PRIMARY CARE PHYSICIAN: Kaitlyn Hector, MD    ADMISSION DIAGNOSIS:  Hip fracture ([place]) [S72.009A] Fall, initial encounter [W19.XXXA] Closed right hip fracture, initial encounter ([place]) [S72.001A]  DISCHARGE DIAGNOSIS:  Right femoral neck fracture status post right hip hemi arthroplasty  SECONDARY DIAGNOSIS:   Past Medical History:  Diagnosis Date  . Abdominal pain, right upper quadrant   . Allergic rhinitis   . Anxiety   . Depression   . Esophageal motility disorder   . Fibromyalgia   . Gait disturbance   . GERD (gastroesophageal reflux disease)   . Grief reaction   . Hemorrhoids, internal, thrombosed   . Interstitial cystitis   . Irritable bowel syndrome   . Osteoporosis   . Parkinson disease ([place])   . Restless leg syndrome   . Shingles   . Sjogren's syndrome ([place])    ?  Marland Kitchen Trochanteric bursitis    bilateral  . Vaginitis     HOSPITAL COURSE:   [name], anxiety/depression, esophageal motility disorder,GERD,fibromyalgia comes to the emergency room from after she had slid off her wheelchair and started having right-sided pain.  1.Right sub capital femoral neck fracture status post mechanical fall -patient has Parkinson's disease. She is wheelchair-bound. She had a mechanical fall  at [facility] -PRN pain meds -orthopedic consultation with Dr. Posey Church- patient is status post right hip hemi arthroplasty POD # 4 -patient does not have history of CAD. Denies any chest pain shortness of breath. -PT recommends rehab  2.Parkinson's disease -cont sinemet -patient is wheelchair-bound however does short steps with help - She follows with  neurology at [facility]  3. Esophageal motility disorder -soft diet  4.Fibromylagia -resume home meds  5.DVT prophylaxis lovenox per ortho  Family Communication: none Consults:orthopedic Dr. Leim Church Code Status:DNR prior to admission. This was discussed with patient in the ER DVT prophylaxis:SCD Procedures: right hip hemi arthroplasty on 4/22   Status is: Inpatient Dispo: The patient is from: [place]  Anticipated d/c is to: [facility]  Anticipated d/c date is: 4/26  Per [facility] insurance auth obtained  Patient currently stable.  D/c today. Pt agreeable CONSULTS OBTAINED:  Treatment Team:  Kaitlyn Fabry, MD  DRUG ALLERGIES:   Allergies  Allergen Reactions  . Lidocaine Other (See Comments)  . Other Other (See Comments)    darvocet. darvocet - HALLUCINATIONS  . Propoxyphene Other (See Comments)    Other Reaction: hallucinations, weird dreams  . Ascorbic Acid Other (See Comments)  . Ascorbic Acid Other (See Comments)  . Azithromycin Other (See Comments)    Other Reaction: sore, swollen tongue  . Basil Oil Other (See Comments)  . Calcium Other (See Comments)  . Celecoxib Other (See Comments)  . Cephalexin     REACTION: felt tired but tolerated  . Dexamethasone Other (See Comments)    Other Reaction: insomnia, chest spasms  . Doxycycline Hives  . Itraconazole Other (See Comments)  . Meperidine Hcl Other (See Comments)  . Mirabegron Other (See Comments)    intolerant  . Morphine Hives  . Morphine Sulfate   . Nitrofurantoin Other (See Comments)    Swollen lip and tongue Blisters on tongue  . Origanum Oil Other (See Comments)  .  Pantoprazole Sodium Other (See Comments)    REACTION: diarrhea REACTION: diarrhea  . Penicillins Other (See Comments)    Has patient had a PCN reaction causing immediate rash, facial/tongue/throat swelling, SOB or lightheadedness with hypotension: Unknown Has patient had a PCN reaction  causing severe rash involving mucus membranes or skin necrosis: Unknown Has patient had a PCN reaction that required hospitalization: Unknown Has patient had a PCN reaction occurring within the last 10 years: Unknown If all of the above answers are "NO", then may proceed with Cephalosporin use.   Marland Kitchen Propoxyphene Hcl   . Sulfa Antibiotics Other (See Comments)    Swollen lip and tongue Blisters on tongue  . Sulfamethoxazole-Trimethoprim Other (See Comments)  . Thimerosal Other (See Comments)    Eye burning    DISCHARGE MEDICATIONS:   Allergies as of 12/24/2019      Reactions   Lidocaine Other (See Comments)   Other Other (See Comments)   darvocet. darvocet - HALLUCINATIONS   Propoxyphene Other (See Comments)   Other Reaction: hallucinations, weird dreams   Ascorbic Acid Other (See Comments)   Ascorbic Acid Other (See Comments)   Azithromycin Other (See Comments)   Other Reaction: sore, swollen tongue   Basil Oil Other (See Comments)   Calcium Other (See Comments)   Celecoxib Other (See Comments)   Cephalexin    REACTION: felt tired but tolerated   Dexamethasone Other (See Comments)   Other Reaction: insomnia, chest spasms   Doxycycline Hives   Itraconazole Other (See Comments)   Meperidine Hcl Other (See Comments)   Mirabegron Other (See Comments)   intolerant   Morphine Hives   Morphine Sulfate    Nitrofurantoin Other (See Comments)   Swollen lip and tongue Blisters on tongue   Origanum Oil Other (See Comments)   Pantoprazole Sodium Other (See Comments)   REACTION: diarrhea REACTION: diarrhea   Penicillins Other (See Comments)   Has patient had a PCN reaction causing immediate rash, facial/tongue/throat swelling, SOB or lightheadedness with hypotension: Unknown Has patient had a PCN reaction causing severe rash involving mucus membranes or skin necrosis: Unknown Has patient had a PCN reaction that required hospitalization: Unknown Has patient had a PCN reaction  occurring within the last 10 years: Unknown If all of the above answers are "NO", then may proceed with Cephalosporin use.   Propoxyphene Hcl    Sulfa Antibiotics Other (See Comments)   Swollen lip and tongue Blisters on tongue   Sulfamethoxazole-trimethoprim Other (See Comments)   Thimerosal Other (See Comments)   Eye burning      Medication List    TAKE these medications   acetaminophen 500 MG tablet Commonly known as: TYLENOL Take 500 mg by mouth every 4 (four) hours as needed.   alum & mag hydroxide-simeth 200-200-20 MG/5ML suspension Commonly known as: MAALOX/MYLANTA Take 30 mLs by mouth 4 (four) times daily as needed for indigestion or heartburn.   antiseptic oral rinse Liqd 15 mLs by Mouth Rinse route as needed for dry mouth.   carbidopa-levodopa 25-100 MG tablet Commonly known as: SINEMET IR Take 1.5 tablets by mouth 5 (five) times daily. (0600, 1030, 1445, 1830, 2230)   dimenhyDRINATE 50 MG tablet Commonly known as: DRAMAMINE Take 50 mg by mouth every 4 (four) hours as needed (muscle spasms).   enoxaparin 40 MG/0.4ML injection Commonly known as: LOVENOX Inject 0.4 mLs (40 mg total) into the skin daily for 14 doses.   estrogens (conjugated) 0.625 MG tablet Commonly known as: PREMARIN Take 0.625 mg  by mouth daily.   FLUoxetine 10 MG tablet Commonly known as: PROZAC Take 5 mg by mouth daily.   ibuprofen 200 MG tablet Commonly known as: ADVIL Take 200 mg by mouth 3 (three) times daily.   loperamide 2 MG capsule Commonly known as: IMODIUM Take 2 mg by mouth as needed for diarrhea or loose stools. (max 8 doses daily)   loratadine 10 MG tablet Commonly known as: CLARITIN Take 5 mg by mouth daily as needed for allergies.   magnesium hydroxide 400 MG/5ML suspension Commonly known as: MILK OF MAGNESIA Take 30 mLs by mouth at bedtime as needed for mild constipation.   magnesium oxide 400 MG tablet Commonly known as: MAG-OX Take 400 mg by mouth at bedtime  as needed (muscle cramps).   oxyCODONE 5 MG immediate release tablet Commonly known as: Oxy IR/ROXICODONE Take 0.5-1 tablets (2.5-5 mg total) by mouth every 4 (four) hours as needed for moderate pain or severe pain (pain score 4-6).   Preparation H 0.25-88.44 % suppository Generic drug: shark liver oil-cocoa butter Place 1 suppository rectally as needed for hemorrhoids.   Robafen 100 MG/5ML syrup Generic drug: guaifenesin Take 200 mg by mouth 4 (four) times daily as needed for cough.   traMADol 50 MG tablet Commonly known as: ULTRAM Take 1 tablet (50 mg total) by mouth every 6 (six) hours as needed for moderate pain.   Triple Antibiotic [phone] Oint Apply 1 application topically daily as needed (wound care).   Uribel 118 MG Caps Take 118 mg by mouth in the morning and at bedtime.   Xiidra 5 % Soln Generic drug: Lifitegrast Place 1 drop into both eyes 2 (two) times daily.       If you experience worsening of your admission symptoms, develop shortness of breath, life threatening emergency, suicidal or homicidal thoughts you must seek medical attention immediately by calling 911 or calling your MD immediately  if symptoms less severe.  You Must read complete instructions/literature along with all the possible adverse reactions/side effects for all the Medicines you take and that have been prescribed to you. Take any new Medicines after you have completely understood and accept all the possible adverse reactions/side effects.   Please note  You were cared for by a hospitalist during your hospital stay. If you have any questions about your discharge medications or the care you received while you were in the hospital after you are discharged, you can call the unit and asked to speak with the hospitalist on call if the hospitalist that took care of you is not available. Once you are discharged, your primary care physician will handle any further medical issues. Please note that NO  REFILLS for any discharge medications will be authorized once you are discharged, as it is imperative that you return to your primary care physician (or establish a relationship with a primary care physician if you do not have one) for your aftercare needs so that they can reassess your need for medications and monitor your lab values. Today   SUBJECTIVE  Overall improving slowly   VITAL SIGNS:  Blood pressure 120/70, pulse 88, temperature 98.9 F (37.2 C), temperature source Oral, resp. rate 18, height (S) 5\' 5"  (1.651 m), weight (S) 43 kg, SpO2 97 %.  I/O:    Intake/Output Summary (Last 24 hours) at 12/24/2019 0936 Last data filed at 12/24/2019 0447 Gross per 24 hour  Intake --  Output 500 ml  Net -500 ml    PHYSICAL EXAMINATION:  GENERAL:  79 y.o.-year-old patient lying in the bed with no acute distress.  EYES: Pupils equal, round, reactive to light and accommodation. No scleral icterus.  HEENT: Head atraumatic, normocephalic. Oropharynx and nasopharynx clear.  NECK:  Supple, no jugular venous distention. No thyroid enlargement, no tenderness.  LUNGS: Normal breath sounds bilaterally, no wheezing, rales,rhonchi or crepitation. No use of accessory muscles of respiration.  CARDIOVASCULAR: S1, S2 normal. No murmurs, rubs, or gallops.  ABDOMEN: Soft, non-tender, non-distended. Bowel sounds present. No organomegaly or mass.  EXTREMITIES: No pedal edema, cyanosis, or clubbing.  NEUROLOGIC: gen weakness. Grossly non focal PSYCHIATRIC: The patient is alert and oriented x 3.  SKIN: No obvious rash, lesion, or ulcer.   DATA REVIEW:   CBC  Recent Labs  Lab 12/23/19 0400  WBC 7.9  HGB 9.6*  HCT 29.0*  PLT 219    Chemistries  Recent Labs  Lab 12/21/19 0312  NA 138  K 3.5  CL 105  CO2 25  GLUCOSE 108*  BUN 12  CREATININE 0.55  CALCIUM 8.1*    Microbiology Results   Recent Results (from the past 240 hour(s))  Respiratory Panel by RT PCR (Flu A&B, Covid) -  Nasopharyngeal Swab     Status: None   Collection Time: 12/19/19  3:47 PM   Specimen: Nasopharyngeal Swab  Result Value Ref Range Status   SARS Coronavirus 2 by RT PCR NEGATIVE NEGATIVE Final    Comment: (NOTE) SARS-CoV-2 target nucleic acids are NOT DETECTED. The SARS-CoV-2 RNA is generally detectable in upper respiratoy specimens during the acute phase of infection. The lowest concentration of SARS-CoV-2 viral copies this assay can detect is 131 copies/mL. A negative result does not preclude SARS-Cov-2 infection and should not be used as the sole basis for treatment or other patient management decisions. A negative result may occur with  improper specimen collection/handling, submission of specimen other than nasopharyngeal swab, presence of viral mutation(s) within the areas targeted by this assay, and inadequate number of viral copies (<131 copies/mL). A negative result must be combined with clinical observations, patient history, and epidemiological information. The expected result is Negative. Fact Sheet for Patients:  PinkCheek.be Fact Sheet for Healthcare Providers:  GravelBags.it This test is not yet ap proved or cleared by the [place] FDA and  has been authorized for detection and/or diagnosis of SARS-CoV-2 by FDA under an Emergency Use Authorization (EUA). This EUA will remain  in effect (meaning this test can be used) for the duration of the COVID-19 declaration under Section 564(b)(1) of the Act, 21 U.S.C. section 360bbb-3(b)(1), unless the authorization is terminated or revoked sooner.    Influenza A by PCR NEGATIVE NEGATIVE Final   Influenza B by PCR NEGATIVE NEGATIVE Final    Comment: (NOTE) The Xpert Xpress SARS-CoV-2/FLU/RSV assay is intended as an aid in  the diagnosis of influenza from Nasopharyngeal swab specimens and  should not be used as a sole basis for treatment. Nasal washings and  aspirates  are unacceptable for Xpert Xpress SARS-CoV-2/FLU/RSV  testing. Fact Sheet for Patients: PinkCheek.be Fact Sheet for Healthcare Providers: GravelBags.it This test is not yet approved or cleared by the [place] FDA and  has been authorized for detection and/or diagnosis of SARS-CoV-2 by  FDA under an Emergency Use Authorization (EUA). This EUA will remain  in effect (meaning this test can be used) for the duration of the  Covid-19 declaration under Section 564(b)(1) of the Act, 21  U.S.C. section 360bbb-3(b)(1), unless the authorization is  terminated or revoked. Performed at [facility], [address] [place], [place] [address]   MRSA PCR Screening     Status: None   Collection Time: 12/20/19  1:03 AM   Specimen: Nasopharyngeal  Result Value Ref Range Status   MRSA by PCR NEGATIVE NEGATIVE Final    Comment:        The GeneXpert MRSA Assay (FDA approved for NASAL specimens only), is one component of a comprehensive MRSA colonization surveillance program. It is not intended to diagnose MRSA infection nor to guide or monitor treatment for MRSA infections. Performed at [facility], [address], [place], [place] [address]     RADIOLOGY:  No results found.   CODE STATUS:     Code Status Orders  (From admission, onward)         Start     Ordered   12/19/19 1644  Full code  Continuous     12/19/19 1643        Code Status History    This patient has a current code status but no historical code status.   Advance Care Planning Activity       TOTAL TIME TAKING CARE OF THIS PATIENT: *40* minutes.    Kaitlyn Church M.D  [facility]    CC: Primary care physician; Kaitlyn Hector, MD

## 2019-12-24 NOTE — TOC Progression Note (Signed)
Transition of Care [facility]) - Progression Note    Patient Details  Name: Kaitlyn Church MRN: [id number] Date of Birth: 08-Nov-1940  [facility]) CM/SW Contact  Shelbie Ammons, RN Phone Number: 12/24/2019, 7:37 AM  Clinical Narrative:   RNCM received message from [facility] with authorization for both SNF and Ambulance transport. Ambulance transport is good for 90 days with and auth# of [id number] and SNF has a start date of 4/26 and is good for 7 days, auth number is [id number]. Clinical information faxed for PASSR,     Expected Discharge Plan: [place] Barriers to Discharge: Continued Medical Work up  Expected Discharge Plan and Services Expected Discharge Plan: [place]   Discharge Planning Services: CM Consult Post Acute Care Choice: [place] Living arrangements for the past 2 months: [place]                                       Social Determinants of Health (SDOH) Interventions    Readmission Risk Interventions No flowsheet data found.

## 2019-12-24 NOTE — Progress Notes (Signed)
Report was called to [facility] and given to [place], [employer]  EMS to be called by CM.  Daughter in room with pt assisting with packing belongings.

## 2019-12-24 NOTE — TOC Transition Note (Signed)
Transition of Care [facility]) - CM/SW Discharge Note   Patient Details  Name: Kaitlyn Church MRN: [id number] Date of Birth: 1941/08/21  [facility] CM/SW Contact:  Su Hilt, RN Phone Number: 12/24/2019, 1:48 PM   Clinical Narrative:    The patient is to be discharged to [facility] room 501 today via EMS transport, The bedside nurse has called report an d RNCM called EMS, 2 are ahead of the patient. The daughter Neoma Laming is aware   Final next level of care: [place] Barriers to Discharge: Barriers Resolved   Patient Goals and CMS Choice        Discharge Placement              Patient chooses bed at: [facility] Patient to be transferred to facility by: ems Name of family member notified: Neoma Laming Patient and family notified of of transfer: 12/24/19  Discharge Plan and Services   Discharge Planning Services: CM Consult Post Acute Care Choice: [place]                               Social Determinants of Health (SDOH) Interventions     Readmission Risk Interventions No flowsheet data found.

## 2019-12-25 DIAGNOSIS — G20 Parkinson's disease: Secondary | ICD-10-CM | POA: Diagnosis not present

## 2019-12-25 DIAGNOSIS — E441 Mild protein-calorie malnutrition: Secondary | ICD-10-CM | POA: Diagnosis not present

## 2019-12-25 DIAGNOSIS — F3341 Major depressive disorder, recurrent, in partial remission: Secondary | ICD-10-CM | POA: Diagnosis not present

## 2019-12-25 DIAGNOSIS — M810 Age-related osteoporosis without current pathological fracture: Secondary | ICD-10-CM | POA: Diagnosis not present

## 2020-01-07 DIAGNOSIS — M25551 Pain in right hip: Secondary | ICD-10-CM | POA: Diagnosis not present

## 2020-01-24 DIAGNOSIS — M6281 Muscle weakness (generalized): Secondary | ICD-10-CM | POA: Diagnosis not present

## 2020-01-24 DIAGNOSIS — R488 Other symbolic dysfunctions: Secondary | ICD-10-CM | POA: Diagnosis not present

## 2020-01-24 DIAGNOSIS — R2689 Other abnormalities of gait and mobility: Secondary | ICD-10-CM | POA: Diagnosis not present

## 2020-01-24 DIAGNOSIS — R1313 Dysphagia, pharyngeal phase: Secondary | ICD-10-CM | POA: Diagnosis not present

## 2020-01-29 DIAGNOSIS — R1313 Dysphagia, pharyngeal phase: Secondary | ICD-10-CM | POA: Diagnosis not present

## 2020-01-29 DIAGNOSIS — R488 Other symbolic dysfunctions: Secondary | ICD-10-CM | POA: Diagnosis not present

## 2020-02-06 DIAGNOSIS — M810 Age-related osteoporosis without current pathological fracture: Secondary | ICD-10-CM | POA: Diagnosis not present

## 2020-02-07 ENCOUNTER — Other Ambulatory Visit: Payer: Self-pay

## 2020-02-07 ENCOUNTER — Emergency Department
Admission: EM | Admit: 2020-02-07 | Discharge: 2020-02-07 | Disposition: A | Discharge status: Home / Self Care | Payer: PPO | Attending: Emergency Medicine | Admitting: Emergency Medicine

## 2020-02-07 ENCOUNTER — Emergency Department: Payer: PPO

## 2020-02-07 ENCOUNTER — Encounter: Payer: Self-pay | Admitting: Emergency Medicine

## 2020-02-07 DIAGNOSIS — R519 Headache, unspecified: Secondary | ICD-10-CM | POA: Diagnosis not present

## 2020-02-07 DIAGNOSIS — Z87891 Personal history of nicotine dependence: Secondary | ICD-10-CM | POA: Diagnosis not present

## 2020-02-07 DIAGNOSIS — R69 Illness, unspecified: Secondary | ICD-10-CM | POA: Diagnosis not present

## 2020-02-07 DIAGNOSIS — Y9384 Activity, sleeping: Secondary | ICD-10-CM | POA: Diagnosis not present

## 2020-02-07 DIAGNOSIS — W228XXA Striking against or struck by other objects, initial encounter: Secondary | ICD-10-CM | POA: Insufficient documentation

## 2020-02-07 DIAGNOSIS — Z79899 Other long term (current) drug therapy: Secondary | ICD-10-CM | POA: Insufficient documentation

## 2020-02-07 DIAGNOSIS — R5381 Other malaise: Secondary | ICD-10-CM | POA: Diagnosis not present

## 2020-02-07 DIAGNOSIS — G20 Parkinson's disease: Secondary | ICD-10-CM | POA: Diagnosis not present

## 2020-02-07 DIAGNOSIS — S0101XA Laceration without foreign body of scalp, initial encounter: Secondary | ICD-10-CM | POA: Diagnosis not present

## 2020-02-07 DIAGNOSIS — Y998 Other external cause status: Secondary | ICD-10-CM | POA: Diagnosis not present

## 2020-02-07 DIAGNOSIS — W19XXXA Unspecified fall, initial encounter: Secondary | ICD-10-CM | POA: Diagnosis not present

## 2020-02-07 DIAGNOSIS — S0990XA Unspecified injury of head, initial encounter: Secondary | ICD-10-CM | POA: Diagnosis present

## 2020-02-07 DIAGNOSIS — M2550 Pain in unspecified joint: Secondary | ICD-10-CM | POA: Diagnosis not present

## 2020-02-07 DIAGNOSIS — Z96641 Presence of right artificial hip joint: Secondary | ICD-10-CM | POA: Diagnosis not present

## 2020-02-07 DIAGNOSIS — Z7401 Bed confinement status: Secondary | ICD-10-CM | POA: Diagnosis not present

## 2020-02-07 DIAGNOSIS — S199XXA Unspecified injury of neck, initial encounter: Secondary | ICD-10-CM | POA: Diagnosis not present

## 2020-02-07 DIAGNOSIS — Y92011 Dining room of single-family (private) house as the place of occurrence of the external cause: Secondary | ICD-10-CM | POA: Diagnosis not present

## 2020-02-07 MED ORDER — LIDOCAINE-EPINEPHRINE 2 %-1:100000 IJ SOLN
20.0000 mL | Freq: Once | INTRAMUSCULAR | Status: AC
  Administered 2020-02-07: 20 mL via INTRADERMAL

## 2020-02-07 MED ORDER — TETANUS-DIPHTH-ACELL PERTUSSIS 5-2.5-18.5 LF-MCG/0.5 IM SUSP
0.5000 mL | Freq: Once | INTRAMUSCULAR | Status: AC
  Administered 2020-02-07: 0.5 mL via INTRAMUSCULAR
  Filled 2020-02-07: qty 0.5

## 2020-02-07 NOTE — ED Notes (Addendum)
Pt BIB [facility] from [facility] with c/o fall after slipping out of chair. Pt is A*OX4. Laceration noted to top of head. Bandage in place.  No blood thinners.

## 2020-02-07 NOTE — ED Provider Notes (Signed)
[facility] Emergency Department Provider Note  ____________________________________________   First MD Initiated Contact with Patient 02/07/20 1202     (approximate)  I have reviewed the triage vital signs and the nursing notes.   HISTORY  Chief Complaint Fall and Head Laceration    HPI Kaitlyn Church is a 79 y.o. female here with fall.  The patient states that she was finished eating her breakfast this morning.  She felt fine while eating.  She states she fell asleep at the table, slipped forward, and struck her head.  Since then, she has had a sharp, stabbing pain along the laceration on the top of her head.  Denies any neck pain.  No upper extremity numbness or weakness.  She has not blood thinners.  Denies any other acute complaints.  She was well prior to the fall.        Past Medical History:  Diagnosis Date   Abdominal pain, right upper quadrant    Allergic rhinitis    Anxiety    Depression    Esophageal motility disorder    Fibromyalgia    Gait disturbance    GERD (gastroesophageal reflux disease)    Grief reaction    Hemorrhoids, internal, thrombosed    Interstitial cystitis    Irritable bowel syndrome    Osteoporosis    Parkinson disease ([place])    Restless leg syndrome    Shingles    Sjogren's syndrome ([place])    ?   Trochanteric bursitis    bilateral   Vaginitis     Patient Active Problem List   Diagnosis Date Noted   S/P hip hemiarthroplasty    Closed right hip fracture, initial encounter ([place]) 12/19/2019   Fall    Arthritis 06/01/2016   B12 deficiency 06/01/2016   Osteoporosis, post-menopausal 06/01/2016   Parkinson disease ([place]) 06/01/2016   Sjogren's syndrome ([place]) 06/01/2016   Chronic cystitis 06/01/2016   Dysuria 04/16/2016   Recurrent major depressive disorder, in partial remission ([place]) 01/07/2016   Chronic constipation 09/24/2014   Dysphagia 09/24/2014   Raynaud's phenomenon  without gangrene 09/24/2014   Chalazion 04/20/2013   Bilateral dry eyes 03/06/2012   Blepharitis of both eyes 03/06/2012   MGD (meibomian gland dysfunction) 03/06/2012   DYSPHAGIA PHARYNGEAL PHASE 07/21/2009   Esophageal motility disorder 02/03/2009   FIBROMYALGIA 02/03/2009   TROCHANTERIC BURSITIS, BILATERAL 06/21/2008   HAND PAIN 06/21/2008   GAIT DISTURBANCE 06/21/2008   ABDOMINAL, RIGHT UPPER, PAIN 06/21/2008   GRIEF REACTION 05/30/2008   VAGINITIS 05/30/2008   OSTEOPOROSIS 05/30/2008   HEMORRHOIDS, INTERNAL, THROMBOSED 03/22/2008   CHANGE IN BOWELS 03/22/2008   FATIGUE 03/10/2007   RESTLESS LEG SYNDROME 02/19/2007   Irritable bowel syndrome 02/19/2007   ANXIETY 02/16/2007   DEPRESSION 02/16/2007   ALLERGIC RHINITIS 02/16/2007   GERD 02/16/2007   INTERSTITIAL CYSTITIS 02/16/2007   SJOGREN'S SYNDROME 02/16/2007   FIBROSITIS 02/16/2007    Past Surgical History:  Procedure Laterality Date   ABDOMINAL HYSTERECTOMY  10/2002   APPENDECTOMY  11/1969   CYSTOCELE REPAIR  1980   ESOPHAGOGASTRODUODENOSCOPY  01/2001   nl   ESOPHAGOGASTRODUODENOSCOPY  09/2006   Normal ([place])   EXPLORATORY LAPAROTOMY  09/1969   HERNIA REPAIR  1980   HIP ARTHROPLASTY Right 12/20/2019   Procedure: ARTHROPLASTY BIPOLAR HIP (HEMIARTHROPLASTY);  Surgeon: Leim Fabry, MD;  Location: [facility] ORS;  Service: Orthopedics;  Laterality: Right;   NASAL SEPTUM SURGERY  11/1976   POLYPECTOMY  03/1972   Bladder   RECTOCELE REPAIR  [place]    Prior to Admission medications   Medication Sig Start Date End Date Taking? Authorizing Provider  acetaminophen (TYLENOL) 500 MG tablet Take 500 mg by mouth every 4 (four) hours as needed.    [provider]  alum & mag hydroxide-simeth (MAALOX/MYLANTA) 200-200-20 MG/5ML suspension Take 30 mLs by mouth 4 (four) times daily as needed for indigestion or heartburn.     [provider]  antiseptic  oral rinse (BIOTENE) LIQD 15 mLs by Mouth Rinse route as needed for dry mouth.    [provider]  carbidopa-levodopa (SINEMET IR) 25-100 MG tablet Take 1.5 tablets by mouth 5 (five) times daily. (0600, 1030, 1445, 1830, 2230)    [provider]  dimenhyDRINATE (DRAMAMINE) 50 MG tablet Take 50 mg by mouth every 4 (four) hours as needed (muscle spasms).     [provider]  enoxaparin (LOVENOX) 40 MG/0.4ML injection Inject 0.4 mLs (40 mg total) into the skin daily for 14 doses. 12/21/19 01/04/20  Reche Dixon, PA-C  estrogens, conjugated, (PREMARIN) 0.625 MG tablet Take 0.625 mg by mouth daily.     [provider]  FLUoxetine (PROZAC) 10 MG tablet Take 5 mg by mouth daily.      [provider]  guaifenesin (ROBAFEN) 100 MG/5ML syrup Take 200 mg by mouth 4 (four) times daily as needed for cough.    [provider]  ibuprofen (ADVIL,MOTRIN) 200 MG tablet Take 200 mg by mouth 3 (three) times daily.     [provider]  Lifitegrast Shirley Friar) 5 % SOLN Place 1 drop into both eyes 2 (two) times daily.     [provider]  loperamide (IMODIUM) 2 MG capsule Take 2 mg by mouth as needed for diarrhea or loose stools. (max 8 doses daily)    [provider]  loratadine (CLARITIN) 10 MG tablet Take 5 mg by mouth daily as needed for allergies.     [provider]  magnesium hydroxide (MILK OF MAGNESIA) 400 MG/5ML suspension Take 30 mLs by mouth at bedtime as needed for mild constipation.    [provider]  magnesium oxide (MAG-OX) 400 MG tablet Take 400 mg by mouth at bedtime as needed (muscle cramps).     [provider]  Meth-Hyo-M Bl-Na Phos-Ph Sal (URIBEL) 118 MG CAPS Take 118 mg by mouth in the morning and at bedtime.     [provider]  Neomycin-Bacitracin-Polymyxin (TRIPLE ANTIBIOTIC) [phone] OINT Apply 1 application topically daily as needed (wound care).     [provider]    oxyCODONE (OXY IR/ROXICODONE) 5 MG immediate release tablet Take 0.5-1 tablets (2.5-5 mg total) by mouth every 4 (four) hours as needed for moderate pain or severe pain (pain score 4-6). 12/21/19   Reche Dixon, PA-C  shark liver oil-cocoa butter (PREPARATION H) 0.25-88.44 % suppository Place 1 suppository rectally as needed for hemorrhoids.    [provider]  traMADol (ULTRAM) 50 MG tablet Take 1 tablet (50 mg total) by mouth every 6 (six) hours as needed for moderate pain. 12/21/19   Reche Dixon, PA-C    Allergies Lidocaine, Other, Propoxyphene, Ascorbic acid, Ascorbic acid, Azithromycin, Basil oil, Calcium, Celecoxib, Cephalexin, Dexamethasone, Doxycycline, Itraconazole, Meperidine hcl, Mirabegron, Morphine, Morphine sulfate, Nitrofurantoin, Origanum oil, Pantoprazole sodium, Penicillins, Propoxyphene hcl, Sulfa antibiotics, Sulfamethoxazole-trimethoprim, and Thimerosal  Family History  Problem Relation Age of Onset   Breast cancer Mother    Alcohol abuse Father    Pneumonia Father  Bipolar disorder Son    Coronary artery disease Neg Hx    Diabetes Neg Hx    Hypertension Neg Hx    Colon cancer Neg Hx    Prostate cancer Neg Hx    Kidney cancer Neg Hx     Social History Social History   Tobacco Use   Smoking status: Former Smoker   Smokeless tobacco: Never Used  Substance Use Topics   Alcohol use: No   Drug use: No    Review of Systems  Review of Systems  Constitutional: Negative for chills and fever.  HENT: Negative for sore throat.   Respiratory: Negative for shortness of breath.   Cardiovascular: Negative for chest pain.  Gastrointestinal: Negative for abdominal pain.  Genitourinary: Negative for flank pain.  Musculoskeletal: Negative for neck pain.  Skin: Positive for wound. Negative for rash.  Allergic/Immunologic: Negative for immunocompromised state.  Neurological: Negative for weakness and numbness.  Hematological: Does not bruise/bleed  easily.     ____________________________________________  PHYSICAL EXAM:      VITAL SIGNS: ED Triage Vitals  Enc Vitals Group     BP 02/07/20 1019 (!) 134/53     Pulse Rate 02/07/20 1019 68     Resp 02/07/20 1019 20     Temp 02/07/20 1019 98.2 F (36.8 C)     Temp Source 02/07/20 1019 Oral     SpO2 02/07/20 1019 99 %     Weight 02/07/20 1020 94 lb 12.8 oz (43 kg)     Height 02/07/20 1020 5\' 4"  (1.626 m)     Head Circumference --      Peak Flow --      Pain Score 02/07/20 1019 4     Pain Loc --      Pain Edu? --      Excl. in [place]? --      Physical Exam Vitals and nursing note reviewed.  Constitutional:      General: She is not in acute distress.    Appearance: She is well-developed.  HENT:     Head: Normocephalic and atraumatic.     Comments: Approximately 4.5 cm linear laceration to the top of the scalp.  Moderate surrounding edema and tenderness.  No crepitance.  No deformity.  No exposed galea or bone.  No periorbital or postauricular ecchymoses.  No hemotympanum.  No other facial trauma. Eyes:     Conjunctiva/sclera: Conjunctivae normal.  Neck:     Comments: No midline or paraspinal cervical neck pain. Cardiovascular:     Rate and Rhythm: Normal rate and regular rhythm.     Heart sounds: Normal heart sounds.  Pulmonary:     Effort: Pulmonary effort is normal. No respiratory distress.     Breath sounds: No wheezing.  Abdominal:     General: There is no distension.  Musculoskeletal:     Cervical back: Neck supple.  Skin:    General: Skin is warm.     Capillary Refill: Capillary refill takes less than 2 seconds.     Findings: No rash.  Neurological:     Mental Status: She is alert and oriented to person, place, and time.     Motor: No abnormal muscle tone.       ____________________________________________   LABS (all labs ordered are listed, but only abnormal results are displayed)  Labs Reviewed - No data to  display  ____________________________________________  EKG: None ________________________________________  RADIOLOGY All imaging, including plain films, CT scans, and ultrasounds, independently reviewed by  me, and interpretations confirmed via formal radiology reads.  ED MD interpretation:   CT Head: NAICA CT C Spine: Negative for acute abnormality  Official radiology report(s): CT Head Wo Contrast  Result Date: 02/07/2020 CLINICAL DATA:  Fall from chair.  Head laceration. EXAM: CT HEAD WITHOUT CONTRAST TECHNIQUE: Contiguous axial images were obtained from the base of the skull through the vertex without intravenous contrast. COMPARISON:  None. FINDINGS: Brain: There is atrophy and chronic small vessel disease changes. No acute intracranial abnormality. Specifically, no hemorrhage, hydrocephalus, mass lesion, acute infarction, or significant intracranial injury. Vascular: No hyperdense vessel or unexpected calcification. Skull: No acute calvarial abnormality. Sinuses/Orbits: Visualized paranasal sinuses and mastoids clear. Orbital soft tissues unremarkable. Other: None IMPRESSION: Atrophy, chronic microvascular disease. No acute intracranial abnormality. Electronically Signed   By: Rolm Baptise M.D.   On: 02/07/2020 11:14   CT Cervical Spine Wo Contrast  Result Date: 02/07/2020 CLINICAL DATA:  Fall EXAM: CT CERVICAL SPINE WITHOUT CONTRAST TECHNIQUE: Multidetector CT imaging of the cervical spine was performed without intravenous contrast. Multiplanar CT image reconstructions were also generated. COMPARISON:  12/19/2019 FINDINGS: Alignment: No subluxation. Skull base and vertebrae: No acute fracture. No primary bone lesion or focal pathologic process. Soft tissues and spinal canal: No prevertebral fluid or swelling. No visible canal hematoma. Disc levels:  Diffuse degenerative disc disease and facet disease. Upper chest: No acute findings Other: None IMPRESSION: No acute bony abnormality.   Diffuse spondylosis. Electronically Signed   By: Rolm Baptise M.D.   On: 02/07/2020 11:52    ____________________________________________  PROCEDURES   Procedure(s) performed (including Critical Care):  Marland KitchenMarland KitchenLaceration Repair  Date/Time: 02/07/2020 1:47 PM Performed by: Duffy Bruce, MD Authorized by: Duffy Bruce, MD   Consent:    Consent obtained:  Verbal   Consent given by:  Patient   Risks discussed:  Infection, need for additional repair, pain, tendon damage, retained foreign body, vascular damage, poor cosmetic result, poor wound healing and nerve damage   Alternatives discussed:  Referral and delayed treatment Anesthesia (see MAR for exact dosages):    Anesthesia method:  Local infiltration   Local anesthetic:  Lidocaine 1% WITH epi Laceration details:    Location:  Scalp   Scalp location:  Mid-scalp   Length (cm):  4.5 Repair type:    Repair type:  Simple Pre-procedure details:    Preparation:  Patient was prepped and draped in usual sterile fashion and imaging obtained to evaluate for foreign bodies Exploration:    Hemostasis achieved with:  Direct pressure   Wound exploration: wound explored through full range of motion and entire depth of wound probed and visualized   Treatment:    Area cleansed with:  Betadine   Amount of cleaning:  Extensive   Irrigation solution:  Sterile water   Irrigation method:  Pressure wash Skin repair:    Repair method:  Staples   Number of staples:  7 Approximation:    Approximation:  Close Post-procedure details:    Dressing:  Antibiotic ointment   Patient tolerance of procedure:  Tolerated well, no immediate complications    ____________________________________________  INITIAL IMPRESSION / MDM / ASSESSMENT AND PLAN / ED COURSE  As part of my medical decision making, I reviewed the following data within the [place] notes reviewed and incorporated, Old chart reviewed, Notes from prior ED  visits, and [place] Controlled Substance Database       *TAMIRA RYLAND was evaluated in Emergency Department on 02/07/2020 for the  symptoms described in the history of present illness. She was evaluated in the context of the global COVID-19 pandemic, which necessitated consideration that the patient might be at risk for infection with the SARS-CoV-2 virus that causes COVID-19. Institutional protocols and algorithms that pertain to the evaluation of patients at risk for COVID-19 are in a state of rapid change based on information released by regulatory bodies including the CDC and federal and state organizations. These policies and algorithms were followed during the patient's care in the ED.  Some ED evaluations and interventions may be delayed as a result of limited staffing during the pandemic.*     Medical Decision Making: 79 year old female here with mechanical fall and laceration to the top of her scalp.  She is otherwise hemodynamically stable and at her mental baseline.  Laceration repaired.  Tetanus updated.  Patient has no upper extremity weakness, numbness, tingling, or signs of paresthesias or evidence of central cord syndrome.  She was well prior to and since the fall.  Discharged back to her facility.  Attempted to call family.  ____________________________________________  FINAL CLINICAL IMPRESSION(S) / ED DIAGNOSES  Final diagnoses:  Laceration of scalp without foreign body, initial encounter  Fall, initial encounter     MEDICATIONS GIVEN DURING THIS VISIT:  Medications  Tdap (BOOSTRIX) injection 0.5 mL (has no administration in time range)  lidocaine-EPINEPHrine (XYLOCAINE W/EPI) 2 %-1:100000 (with pres) injection 20 mL (20 mLs Intradermal Given by Other 02/07/20 1300)     ED Discharge Orders    None       Note:  This document was prepared using Dragon voice recognition software and may include unintentional dictation errors.   Duffy Bruce, MD 02/07/20 1348

## 2020-02-07 NOTE — ED Triage Notes (Signed)
Pt presents to ED via [facility] from [facility] with c/o head laceration after sliding out of chair and hitting her head. Pt with laceration to top of her head dressing applied PTA, bleeding controlled at this time. Pt denies LOC. Pt alert and answering questions in triage.   Pt oriented to person, place, and situation, disoriented to year.

## 2020-02-07 NOTE — ED Notes (Signed)
Patient given snack.  

## 2020-02-07 NOTE — ED Notes (Signed)
Called [facility] for transport to [place]

## 2020-02-07 NOTE — Discharge Instructions (Addendum)
Staple removal in 10 days  It is OK to shower  Keep the area clean and dry   I recommend keeping it covered in antibiotic ointment as well

## 2020-02-14 DIAGNOSIS — F3341 Major depressive disorder, recurrent, in partial remission: Secondary | ICD-10-CM | POA: Diagnosis not present

## 2020-02-14 DIAGNOSIS — Z789 Other specified health status: Secondary | ICD-10-CM | POA: Diagnosis not present

## 2020-02-14 DIAGNOSIS — Z0000 Encounter for general adult medical examination without abnormal findings: Secondary | ICD-10-CM | POA: Diagnosis not present

## 2020-02-14 DIAGNOSIS — M3509 Sicca syndrome with other organ involvement: Secondary | ICD-10-CM | POA: Diagnosis not present

## 2020-02-14 DIAGNOSIS — E441 Mild protein-calorie malnutrition: Secondary | ICD-10-CM | POA: Diagnosis not present

## 2020-02-14 DIAGNOSIS — G20 Parkinson's disease: Secondary | ICD-10-CM | POA: Diagnosis not present

## 2020-02-15 DIAGNOSIS — F331 Major depressive disorder, recurrent, moderate: Secondary | ICD-10-CM | POA: Diagnosis not present

## 2020-02-15 DIAGNOSIS — F419 Anxiety disorder, unspecified: Secondary | ICD-10-CM | POA: Diagnosis not present

## 2020-02-20 DIAGNOSIS — Z96641 Presence of right artificial hip joint: Secondary | ICD-10-CM | POA: Diagnosis not present

## 2020-02-28 DIAGNOSIS — F331 Major depressive disorder, recurrent, moderate: Secondary | ICD-10-CM | POA: Diagnosis not present

## 2020-02-29 DIAGNOSIS — F419 Anxiety disorder, unspecified: Secondary | ICD-10-CM | POA: Diagnosis not present

## 2020-02-29 DIAGNOSIS — F331 Major depressive disorder, recurrent, moderate: Secondary | ICD-10-CM | POA: Diagnosis not present

## 2020-03-17 DIAGNOSIS — R488 Other symbolic dysfunctions: Secondary | ICD-10-CM | POA: Diagnosis not present

## 2020-03-17 DIAGNOSIS — R1313 Dysphagia, pharyngeal phase: Secondary | ICD-10-CM | POA: Diagnosis not present

## 2020-03-20 DIAGNOSIS — F331 Major depressive disorder, recurrent, moderate: Secondary | ICD-10-CM | POA: Diagnosis not present

## 2020-03-26 DIAGNOSIS — G20 Parkinson's disease: Secondary | ICD-10-CM | POA: Diagnosis not present

## 2020-03-31 DIAGNOSIS — R488 Other symbolic dysfunctions: Secondary | ICD-10-CM | POA: Diagnosis not present

## 2020-03-31 DIAGNOSIS — R1313 Dysphagia, pharyngeal phase: Secondary | ICD-10-CM | POA: Diagnosis not present

## 2020-04-02 DIAGNOSIS — Z96641 Presence of right artificial hip joint: Secondary | ICD-10-CM | POA: Diagnosis not present

## 2020-04-03 DIAGNOSIS — F331 Major depressive disorder, recurrent, moderate: Secondary | ICD-10-CM | POA: Diagnosis not present

## 2020-04-11 DIAGNOSIS — F331 Major depressive disorder, recurrent, moderate: Secondary | ICD-10-CM | POA: Diagnosis not present

## 2020-04-11 DIAGNOSIS — F419 Anxiety disorder, unspecified: Secondary | ICD-10-CM | POA: Diagnosis not present

## 2020-04-17 DIAGNOSIS — F331 Major depressive disorder, recurrent, moderate: Secondary | ICD-10-CM | POA: Diagnosis not present

## 2020-04-30 DIAGNOSIS — R488 Other symbolic dysfunctions: Secondary | ICD-10-CM | POA: Diagnosis not present

## 2020-04-30 DIAGNOSIS — R1313 Dysphagia, pharyngeal phase: Secondary | ICD-10-CM | POA: Diagnosis not present

## 2020-04-30 DIAGNOSIS — M6281 Muscle weakness (generalized): Secondary | ICD-10-CM | POA: Diagnosis not present

## 2020-04-30 DIAGNOSIS — R262 Difficulty in walking, not elsewhere classified: Secondary | ICD-10-CM | POA: Diagnosis not present

## 2020-05-01 DIAGNOSIS — G20 Parkinson's disease: Secondary | ICD-10-CM | POA: Diagnosis not present

## 2020-05-01 DIAGNOSIS — E441 Mild protein-calorie malnutrition: Secondary | ICD-10-CM | POA: Diagnosis not present

## 2020-05-01 DIAGNOSIS — F331 Major depressive disorder, recurrent, moderate: Secondary | ICD-10-CM | POA: Diagnosis not present

## 2020-05-01 DIAGNOSIS — F3341 Major depressive disorder, recurrent, in partial remission: Secondary | ICD-10-CM | POA: Diagnosis not present

## 2020-05-01 DIAGNOSIS — Z789 Other specified health status: Secondary | ICD-10-CM | POA: Diagnosis not present

## 2020-05-23 DIAGNOSIS — F419 Anxiety disorder, unspecified: Secondary | ICD-10-CM | POA: Diagnosis not present

## 2020-05-23 DIAGNOSIS — F331 Major depressive disorder, recurrent, moderate: Secondary | ICD-10-CM | POA: Diagnosis not present

## 2020-05-30 DIAGNOSIS — M6281 Muscle weakness (generalized): Secondary | ICD-10-CM | POA: Diagnosis not present

## 2020-05-30 DIAGNOSIS — R262 Difficulty in walking, not elsewhere classified: Secondary | ICD-10-CM | POA: Diagnosis not present

## 2020-06-05 DIAGNOSIS — F331 Major depressive disorder, recurrent, moderate: Secondary | ICD-10-CM | POA: Diagnosis not present

## 2020-06-19 DIAGNOSIS — F331 Major depressive disorder, recurrent, moderate: Secondary | ICD-10-CM | POA: Diagnosis not present

## 2020-06-20 DIAGNOSIS — F419 Anxiety disorder, unspecified: Secondary | ICD-10-CM | POA: Diagnosis not present

## 2020-06-20 DIAGNOSIS — F331 Major depressive disorder, recurrent, moderate: Secondary | ICD-10-CM | POA: Diagnosis not present

## 2020-07-07 DIAGNOSIS — F331 Major depressive disorder, recurrent, moderate: Secondary | ICD-10-CM | POA: Diagnosis not present

## 2020-07-16 DIAGNOSIS — H04123 Dry eye syndrome of bilateral lacrimal glands: Secondary | ICD-10-CM | POA: Diagnosis not present

## 2020-07-18 DIAGNOSIS — F419 Anxiety disorder, unspecified: Secondary | ICD-10-CM | POA: Diagnosis not present

## 2020-07-18 DIAGNOSIS — F331 Major depressive disorder, recurrent, moderate: Secondary | ICD-10-CM | POA: Diagnosis not present

## 2020-07-31 ENCOUNTER — Ambulatory Visit (INDEPENDENT_AMBULATORY_CARE_PROVIDER_SITE_OTHER): Payer: PPO | Admitting: Podiatry

## 2020-07-31 ENCOUNTER — Other Ambulatory Visit: Payer: Self-pay

## 2020-07-31 ENCOUNTER — Encounter: Payer: Self-pay | Admitting: Podiatry

## 2020-07-31 DIAGNOSIS — M79674 Pain in right toe(s): Secondary | ICD-10-CM | POA: Diagnosis not present

## 2020-07-31 DIAGNOSIS — M79675 Pain in left toe(s): Secondary | ICD-10-CM | POA: Diagnosis not present

## 2020-07-31 DIAGNOSIS — M2170 Unequal limb length (acquired), unspecified site: Secondary | ICD-10-CM

## 2020-07-31 DIAGNOSIS — B351 Tinea unguium: Secondary | ICD-10-CM

## 2020-07-31 DIAGNOSIS — F331 Major depressive disorder, recurrent, moderate: Secondary | ICD-10-CM | POA: Diagnosis not present

## 2020-07-31 NOTE — Progress Notes (Signed)
This patient presents to the office for nail care both feet.  She presents to the office with a caregiver.  She says her nails were done 2 weeks ago at her facility.  She says the big toenails are still thick and painful.  She also says she has a hip fracture which has led to limb length discrepancy.  She is interested insoles to help with her limb length.  General Appearance  Alert, conversant and in no acute stress.  Vascular  Dorsalis pedis and posterior tibial  pulses are palpable  Right foot.  Dorsalis pedis and posterior tibial pulses are absent left foot. Absent pedal hair..  Capillary return is within normal limits  bilaterally. Temperature is within normal limits  bilaterally.  Neurologic  Senn-Weinstein monofilament wire test within normal limits  bilaterally. Muscle power within normal limits bilaterally.  Nails Thick disfigured discolored nails with subungual debris  Hallux nails  B/L. Marland Kitchen No evidence of bacterial infection or drainage bilaterally.  Orthopedic  No limitations of motion  feet .  No crepitus or effusions noted.  No bony pathology or digital deformities noted.  Limb length due to hip fracture.  Skin  normotropic skin with no porokeratosis noted bilaterally.  No signs of infections or ulcers noted.    Onychomycosis  Limb length.  ROB.  Debride nails with nail nipper and dremel tool.  Patient was told to make an appointment for evaluation limb length with E.J   RTC prn   Gardiner Barefoot DPM

## 2020-08-07 DIAGNOSIS — F331 Major depressive disorder, recurrent, moderate: Secondary | ICD-10-CM | POA: Diagnosis not present

## 2020-08-14 DIAGNOSIS — F331 Major depressive disorder, recurrent, moderate: Secondary | ICD-10-CM | POA: Diagnosis not present

## 2020-08-15 DIAGNOSIS — R7303 Prediabetes: Secondary | ICD-10-CM | POA: Diagnosis not present

## 2020-08-15 DIAGNOSIS — I10 Essential (primary) hypertension: Secondary | ICD-10-CM | POA: Diagnosis not present

## 2020-08-15 DIAGNOSIS — F331 Major depressive disorder, recurrent, moderate: Secondary | ICD-10-CM | POA: Diagnosis not present

## 2020-08-15 DIAGNOSIS — R1313 Dysphagia, pharyngeal phase: Secondary | ICD-10-CM | POA: Diagnosis not present

## 2020-08-15 DIAGNOSIS — F419 Anxiety disorder, unspecified: Secondary | ICD-10-CM | POA: Diagnosis not present

## 2020-08-19 DIAGNOSIS — E441 Mild protein-calorie malnutrition: Secondary | ICD-10-CM | POA: Diagnosis not present

## 2020-08-19 DIAGNOSIS — Z789 Other specified health status: Secondary | ICD-10-CM | POA: Diagnosis not present

## 2020-08-19 DIAGNOSIS — F3341 Major depressive disorder, recurrent, in partial remission: Secondary | ICD-10-CM | POA: Diagnosis not present

## 2020-08-19 DIAGNOSIS — G20 Parkinson's disease: Secondary | ICD-10-CM | POA: Diagnosis not present

## 2020-09-01 DIAGNOSIS — R1313 Dysphagia, pharyngeal phase: Secondary | ICD-10-CM | POA: Diagnosis not present

## 2020-09-18 DIAGNOSIS — F331 Major depressive disorder, recurrent, moderate: Secondary | ICD-10-CM | POA: Diagnosis not present

## 2020-09-19 DIAGNOSIS — F331 Major depressive disorder, recurrent, moderate: Secondary | ICD-10-CM | POA: Diagnosis not present

## 2020-09-19 DIAGNOSIS — F419 Anxiety disorder, unspecified: Secondary | ICD-10-CM | POA: Diagnosis not present

## 2020-09-25 DIAGNOSIS — F331 Major depressive disorder, recurrent, moderate: Secondary | ICD-10-CM | POA: Diagnosis not present

## 2020-09-29 DIAGNOSIS — G20 Parkinson's disease: Secondary | ICD-10-CM | POA: Diagnosis not present

## 2020-10-09 DIAGNOSIS — F331 Major depressive disorder, recurrent, moderate: Secondary | ICD-10-CM | POA: Diagnosis not present

## 2020-10-15 DIAGNOSIS — E441 Mild protein-calorie malnutrition: Secondary | ICD-10-CM | POA: Diagnosis not present

## 2020-10-15 DIAGNOSIS — M3500 Sicca syndrome, unspecified: Secondary | ICD-10-CM | POA: Diagnosis not present

## 2020-10-15 DIAGNOSIS — G20 Parkinson's disease: Secondary | ICD-10-CM | POA: Diagnosis not present

## 2020-10-15 DIAGNOSIS — Z0000 Encounter for general adult medical examination without abnormal findings: Secondary | ICD-10-CM | POA: Diagnosis not present

## 2020-10-15 DIAGNOSIS — Z789 Other specified health status: Secondary | ICD-10-CM | POA: Diagnosis not present

## 2020-10-15 DIAGNOSIS — F3341 Major depressive disorder, recurrent, in partial remission: Secondary | ICD-10-CM | POA: Diagnosis not present

## 2020-10-17 DIAGNOSIS — F419 Anxiety disorder, unspecified: Secondary | ICD-10-CM | POA: Diagnosis not present

## 2020-10-17 DIAGNOSIS — F331 Major depressive disorder, recurrent, moderate: Secondary | ICD-10-CM | POA: Diagnosis not present

## 2020-10-23 DIAGNOSIS — F331 Major depressive disorder, recurrent, moderate: Secondary | ICD-10-CM | POA: Diagnosis not present

## 2020-11-13 DIAGNOSIS — F331 Major depressive disorder, recurrent, moderate: Secondary | ICD-10-CM | POA: Diagnosis not present

## 2020-11-14 DIAGNOSIS — F419 Anxiety disorder, unspecified: Secondary | ICD-10-CM | POA: Diagnosis not present

## 2020-11-14 DIAGNOSIS — F331 Major depressive disorder, recurrent, moderate: Secondary | ICD-10-CM | POA: Diagnosis not present

## 2020-11-27 DIAGNOSIS — F331 Major depressive disorder, recurrent, moderate: Secondary | ICD-10-CM | POA: Diagnosis not present

## 2020-12-11 DIAGNOSIS — F331 Major depressive disorder, recurrent, moderate: Secondary | ICD-10-CM | POA: Diagnosis not present

## 2020-12-19 DIAGNOSIS — F419 Anxiety disorder, unspecified: Secondary | ICD-10-CM | POA: Diagnosis not present

## 2020-12-19 DIAGNOSIS — F331 Major depressive disorder, recurrent, moderate: Secondary | ICD-10-CM | POA: Diagnosis not present

## 2021-01-01 DIAGNOSIS — F331 Major depressive disorder, recurrent, moderate: Secondary | ICD-10-CM | POA: Diagnosis not present

## 2021-01-05 DIAGNOSIS — G20 Parkinson's disease: Secondary | ICD-10-CM | POA: Diagnosis not present

## 2021-01-05 DIAGNOSIS — E441 Mild protein-calorie malnutrition: Secondary | ICD-10-CM | POA: Diagnosis not present

## 2021-01-05 DIAGNOSIS — Z789 Other specified health status: Secondary | ICD-10-CM | POA: Diagnosis not present

## 2021-01-05 DIAGNOSIS — M3500 Sicca syndrome, unspecified: Secondary | ICD-10-CM | POA: Diagnosis not present

## 2021-01-17 DIAGNOSIS — R109 Unspecified abdominal pain: Secondary | ICD-10-CM | POA: Diagnosis not present

## 2021-01-23 DIAGNOSIS — F331 Major depressive disorder, recurrent, moderate: Secondary | ICD-10-CM | POA: Diagnosis not present

## 2021-01-23 DIAGNOSIS — F419 Anxiety disorder, unspecified: Secondary | ICD-10-CM | POA: Diagnosis not present

## 2021-01-29 DIAGNOSIS — F331 Major depressive disorder, recurrent, moderate: Secondary | ICD-10-CM | POA: Diagnosis not present

## 2021-02-20 DIAGNOSIS — F419 Anxiety disorder, unspecified: Secondary | ICD-10-CM | POA: Diagnosis not present

## 2021-02-20 DIAGNOSIS — F331 Major depressive disorder, recurrent, moderate: Secondary | ICD-10-CM | POA: Diagnosis not present

## 2021-03-02 DIAGNOSIS — R2681 Unsteadiness on feet: Secondary | ICD-10-CM | POA: Diagnosis not present

## 2021-03-02 DIAGNOSIS — R2689 Other abnormalities of gait and mobility: Secondary | ICD-10-CM | POA: Diagnosis not present

## 2021-03-02 DIAGNOSIS — M6281 Muscle weakness (generalized): Secondary | ICD-10-CM | POA: Diagnosis not present

## 2021-03-21 DIAGNOSIS — F331 Major depressive disorder, recurrent, moderate: Secondary | ICD-10-CM | POA: Diagnosis not present

## 2021-03-23 DIAGNOSIS — F331 Major depressive disorder, recurrent, moderate: Secondary | ICD-10-CM | POA: Diagnosis not present

## 2021-03-23 DIAGNOSIS — F419 Anxiety disorder, unspecified: Secondary | ICD-10-CM | POA: Diagnosis not present

## 2021-04-09 DIAGNOSIS — F331 Major depressive disorder, recurrent, moderate: Secondary | ICD-10-CM | POA: Diagnosis not present

## 2021-04-10 DIAGNOSIS — F419 Anxiety disorder, unspecified: Secondary | ICD-10-CM | POA: Diagnosis not present

## 2021-04-10 DIAGNOSIS — F331 Major depressive disorder, recurrent, moderate: Secondary | ICD-10-CM | POA: Diagnosis not present

## 2021-04-16 ENCOUNTER — Ambulatory Visit (INDEPENDENT_AMBULATORY_CARE_PROVIDER_SITE_OTHER): Payer: PPO | Admitting: Podiatry

## 2021-04-16 ENCOUNTER — Other Ambulatory Visit: Payer: Self-pay

## 2021-04-16 DIAGNOSIS — B351 Tinea unguium: Secondary | ICD-10-CM | POA: Diagnosis not present

## 2021-04-16 DIAGNOSIS — M3500 Sicca syndrome, unspecified: Secondary | ICD-10-CM | POA: Diagnosis not present

## 2021-04-16 DIAGNOSIS — M79674 Pain in right toe(s): Secondary | ICD-10-CM

## 2021-04-16 DIAGNOSIS — F3341 Major depressive disorder, recurrent, in partial remission: Secondary | ICD-10-CM | POA: Diagnosis not present

## 2021-04-16 DIAGNOSIS — E441 Mild protein-calorie malnutrition: Secondary | ICD-10-CM | POA: Diagnosis not present

## 2021-04-16 DIAGNOSIS — M79675 Pain in left toe(s): Secondary | ICD-10-CM | POA: Diagnosis not present

## 2021-04-16 DIAGNOSIS — Z789 Other specified health status: Secondary | ICD-10-CM | POA: Diagnosis not present

## 2021-04-16 DIAGNOSIS — G20 Parkinson's disease: Secondary | ICD-10-CM | POA: Diagnosis not present

## 2021-04-16 DIAGNOSIS — Q667 Congenital pes cavus, unspecified foot: Secondary | ICD-10-CM

## 2021-04-16 NOTE — Progress Notes (Signed)
  Subjective:  Patient ID: Kaitlyn Church, female    DOB: Sep 20, 1940,  MRN: [id number]  Chief Complaint  Patient presents with   Nail Problem    Nail trim  Eval for shoes    80 y.o. female returns for the above complaint.  Patient presents with thickened elongated dystrophic toenails x10.  Patient would like for me to debride them.  She is not able to do her self.  She has secondary complaint of pes cavus foot structure secondary to underlying Parkinson's disease.  She would like to discuss getting new set of orthotics.  The current ones have worn and they were made about 4 years ago.  She denies any other acute complaints.  Pain with ambulation.  Objective:  There were no vitals filed for this visit. Podiatric Exam: Vascular: dorsalis pedis and posterior tibial pulses are palpable bilateral. Capillary return is immediate. Temperature gradient is WNL. Skin turgor WNL  Sensorium: Normal Semmes Weinstein monofilament test. Normal tactile sensation bilaterally. Nail Exam: Pt has thick disfigured discolored nails with subungual debris noted bilateral entire nail hallux through fifth toenails.  Pain on palpation to the nails. Ulcer Exam: There is no evidence of ulcer or pre-ulcerative changes or infection. Orthopedic Exam: Muscle tone and strength are WNL. No limitations in general ROM. No crepitus or effusions noted.  Baseline tremoring noted to both lower extremity.  Pes cavus foot structure noted with cable adductovarus foot type.  Reducible deformity. Skin: No Porokeratosis. No infection or ulcers    Assessment & Plan:   1. Pes cavus   2. Pain due to onychomycosis of toenails of both feet     Patient was evaluated and treated and all questions answered.  Pes cavus secondary to Parkinson's -I explained the patient the etiology of pes cavus deformity and various treatment options were discussed.  I discussed with the patient that she would benefit from custom-made orthotics to help  control the hindfoot motion support the arch of the foot take the stress away.  Should be scheduled see EJ for orthotics  Onychomycosis with pain  -Nails palliatively debrided as below. -Educated on self-care  Procedure: Nail Debridement Rationale: pain  Type of Debridement: manual, sharp debridement. Instrumentation: Nail nipper, rotary burr. Number of Nails: 10  Procedures and Treatment: Consent by patient was obtained for treatment procedures. The patient understood the discussion of treatment and procedures well. All questions were answered thoroughly reviewed. Debridement of mycotic and hypertrophic toenails, 1 through 5 bilateral and clearing of subungual debris. No ulceration, no infection noted.  Return Visit-Office Procedure: Patient instructed to return to the office for a follow up visit 3 months for continued evaluation and treatment.  Boneta Lucks, DPM    No follow-ups on file.

## 2021-04-22 DIAGNOSIS — R109 Unspecified abdominal pain: Secondary | ICD-10-CM | POA: Diagnosis not present

## 2021-04-22 DIAGNOSIS — Z79899 Other long term (current) drug therapy: Secondary | ICD-10-CM | POA: Diagnosis not present

## 2021-04-30 DIAGNOSIS — F331 Major depressive disorder, recurrent, moderate: Secondary | ICD-10-CM | POA: Diagnosis not present

## 2021-05-14 DIAGNOSIS — F331 Major depressive disorder, recurrent, moderate: Secondary | ICD-10-CM | POA: Diagnosis not present

## 2021-05-15 DIAGNOSIS — F331 Major depressive disorder, recurrent, moderate: Secondary | ICD-10-CM | POA: Diagnosis not present

## 2021-05-15 DIAGNOSIS — F419 Anxiety disorder, unspecified: Secondary | ICD-10-CM | POA: Diagnosis not present

## 2021-06-02 DIAGNOSIS — F419 Anxiety disorder, unspecified: Secondary | ICD-10-CM | POA: Diagnosis not present

## 2021-06-02 DIAGNOSIS — F331 Major depressive disorder, recurrent, moderate: Secondary | ICD-10-CM | POA: Diagnosis not present

## 2021-06-04 DIAGNOSIS — F331 Major depressive disorder, recurrent, moderate: Secondary | ICD-10-CM | POA: Diagnosis not present

## 2021-06-10 ENCOUNTER — Other Ambulatory Visit (INDEPENDENT_AMBULATORY_CARE_PROVIDER_SITE_OTHER): Payer: PPO

## 2021-06-10 ENCOUNTER — Other Ambulatory Visit: Payer: Self-pay

## 2021-06-10 ENCOUNTER — Encounter (INDEPENDENT_AMBULATORY_CARE_PROVIDER_SITE_OTHER): Payer: Self-pay

## 2021-06-10 DIAGNOSIS — Q667 Congenital pes cavus, unspecified foot: Secondary | ICD-10-CM

## 2021-06-16 DIAGNOSIS — R2689 Other abnormalities of gait and mobility: Secondary | ICD-10-CM | POA: Diagnosis not present

## 2021-06-16 DIAGNOSIS — M6281 Muscle weakness (generalized): Secondary | ICD-10-CM | POA: Diagnosis not present

## 2021-06-16 DIAGNOSIS — R2681 Unsteadiness on feet: Secondary | ICD-10-CM | POA: Diagnosis not present

## 2021-06-17 DIAGNOSIS — B9620 Unspecified Escherichia coli [E. coli] as the cause of diseases classified elsewhere: Secondary | ICD-10-CM | POA: Diagnosis not present

## 2021-06-17 DIAGNOSIS — R300 Dysuria: Secondary | ICD-10-CM | POA: Diagnosis not present

## 2021-07-03 DIAGNOSIS — F331 Major depressive disorder, recurrent, moderate: Secondary | ICD-10-CM | POA: Diagnosis not present

## 2021-07-03 DIAGNOSIS — F419 Anxiety disorder, unspecified: Secondary | ICD-10-CM | POA: Diagnosis not present

## 2021-07-09 DIAGNOSIS — F331 Major depressive disorder, recurrent, moderate: Secondary | ICD-10-CM | POA: Diagnosis not present

## 2021-07-27 DIAGNOSIS — F3341 Major depressive disorder, recurrent, in partial remission: Secondary | ICD-10-CM | POA: Diagnosis not present

## 2021-07-27 DIAGNOSIS — Z789 Other specified health status: Secondary | ICD-10-CM | POA: Diagnosis not present

## 2021-07-27 DIAGNOSIS — G20 Parkinson's disease: Secondary | ICD-10-CM | POA: Diagnosis not present

## 2021-07-27 DIAGNOSIS — N3010 Interstitial cystitis (chronic) without hematuria: Secondary | ICD-10-CM | POA: Diagnosis not present

## 2021-07-27 DIAGNOSIS — M3500 Sicca syndrome, unspecified: Secondary | ICD-10-CM | POA: Diagnosis not present

## 2021-07-27 DIAGNOSIS — E441 Mild protein-calorie malnutrition: Secondary | ICD-10-CM | POA: Diagnosis not present

## 2021-07-30 DIAGNOSIS — N390 Urinary tract infection, site not specified: Secondary | ICD-10-CM | POA: Diagnosis not present

## 2021-08-04 DIAGNOSIS — F331 Major depressive disorder, recurrent, moderate: Secondary | ICD-10-CM | POA: Diagnosis not present

## 2021-08-04 DIAGNOSIS — F419 Anxiety disorder, unspecified: Secondary | ICD-10-CM | POA: Diagnosis not present

## 2021-08-12 ENCOUNTER — Telehealth: Payer: Self-pay | Admitting: Podiatry

## 2021-08-12 NOTE — Telephone Encounter (Signed)
Late entry from this morning @ 1026am. Received a call from Leda Gauze asking if pts self pay shoes where in that were ordered on 10.12 because they have not heard anything. Upon checking in [place] they were there. I told pts caregiver  I could get her scheduled with Aaron Edelman this Friday or next Monday to pick up and she said no. She was picking them up today and taking them to the patient. That she discussed that with EJ at the appt and was told it was ok. Leda Gauze seemed to get upset about me trying to set up the appt.

## 2021-08-28 ENCOUNTER — Ambulatory Visit: Payer: PPO

## 2021-08-28 ENCOUNTER — Other Ambulatory Visit: Payer: Self-pay

## 2021-08-28 DIAGNOSIS — Q667 Congenital pes cavus, unspecified foot: Secondary | ICD-10-CM

## 2021-08-28 DIAGNOSIS — M2170 Unequal limb length (acquired), unspecified site: Secondary | ICD-10-CM

## 2021-08-28 DIAGNOSIS — M79675 Pain in left toe(s): Secondary | ICD-10-CM

## 2021-08-28 NOTE — Progress Notes (Signed)
SITUATION Reason for Consult: Follow-up with self pay shoes Patient / Caregiver Report: Patient reports it is difficult for her aids to don and doff shoes  OBJECTIVE DATA History / Diagnosis:    ICD-10-CM   1. Pes cavus  Q66.70     2. Acquired unequal limb length  M21.70     3. Pain due to onychomycosis of toenails of both feet  B35.1    M79.675    M79.674       Change in Pathology: None  ACTIONS PERFORMED Patient's equipment was checked for structural stability and fit. Took patient's incorrecty sized shoes and offered to replace with [id number] lycra stretch shoe with heel strap. Patient is satisfied with this plan and approves the shoe. Remeasured and determined patient is 7.87M not 8.5 as ordered. Device(s) intact and fit is excellent. All questions answered and concerns addressed.  PLAN Return in four weeks for shoe fitting. Plan of care discussed with and agreed upon by patient / caregiver.

## 2021-08-31 DIAGNOSIS — R2689 Other abnormalities of gait and mobility: Secondary | ICD-10-CM | POA: Diagnosis not present

## 2021-08-31 DIAGNOSIS — M6281 Muscle weakness (generalized): Secondary | ICD-10-CM | POA: Diagnosis not present

## 2021-08-31 DIAGNOSIS — R2681 Unsteadiness on feet: Secondary | ICD-10-CM | POA: Diagnosis not present

## 2021-09-01 ENCOUNTER — Ambulatory Visit: Payer: PPO | Admitting: Podiatry

## 2021-09-10 ENCOUNTER — Other Ambulatory Visit: Payer: Self-pay

## 2021-09-10 ENCOUNTER — Ambulatory Visit (INDEPENDENT_AMBULATORY_CARE_PROVIDER_SITE_OTHER): Payer: PPO | Admitting: Podiatry

## 2021-09-10 ENCOUNTER — Encounter: Payer: Self-pay | Admitting: Podiatry

## 2021-09-10 DIAGNOSIS — M79674 Pain in right toe(s): Secondary | ICD-10-CM

## 2021-09-10 DIAGNOSIS — B351 Tinea unguium: Secondary | ICD-10-CM | POA: Diagnosis not present

## 2021-09-10 DIAGNOSIS — M79675 Pain in left toe(s): Secondary | ICD-10-CM

## 2021-09-10 NOTE — Progress Notes (Signed)
°  Subjective:  Patient ID: Kaitlyn Church, female    DOB: [birth year],  MRN: [id number]  Chief Complaint  Patient presents with   Nail Problem    "Cut my toenails."   81 y.o. female returns for the above complaint.  Patient presents with thickened elongated dystrophic toenails x10.  Patient would like for me to debride them.  She is not able to do her self.  She does not overdo it herself.  She denies any other acute complaints.  Objective:  There were no vitals filed for this visit. Podiatric Exam: Vascular: dorsalis pedis and posterior tibial pulses are palpable bilateral. Capillary return is immediate. Temperature gradient is WNL. Skin turgor WNL  Sensorium: Normal Semmes Weinstein monofilament test. Normal tactile sensation bilaterally. Nail Exam: Pt has thick disfigured discolored nails with subungual debris noted bilateral entire nail hallux through fifth toenails.  Pain on palpation to the nails. Ulcer Exam: There is no evidence of ulcer or pre-ulcerative changes or infection. Orthopedic Exam: Muscle tone and strength are WNL. No limitations in general ROM. No crepitus or effusions noted.  Baseline tremoring noted to both lower extremity.  Pes cavus foot structure noted with cable adductovarus foot type.  Reducible deformity. Skin: No Porokeratosis. No infection or ulcers    Assessment & Plan:   1. Pain due to onychomycosis of toenails of both feet      Patient was evaluated and treated and all questions answered.  Pes cavus secondary to Parkinson's -I explained the patient the etiology of pes cavus deformity and various treatment options were discussed.  I discussed with the patient that she would benefit from custom-made orthotics to help control the hindfoot motion support the arch of the foot take the stress away.   -She was seen by Aaron Edelman for adjustment.  Awaiting bracing  Onychomycosis with pain  -Nails palliatively debrided as below. -Educated on  self-care  Procedure: Nail Debridement Rationale: pain  Type of Debridement: manual, sharp debridement. Instrumentation: Nail nipper, rotary burr. Number of Nails: 10  Procedures and Treatment: Consent by patient was obtained for treatment procedures. The patient understood the discussion of treatment and procedures well. All questions were answered thoroughly reviewed. Debridement of mycotic and hypertrophic toenails, 1 through 5 bilateral and clearing of subungual debris. No ulceration, no infection noted.  Return Visit-Office Procedure: Patient instructed to return to the office for a follow up visit 3 months for continued evaluation and treatment.  Boneta Lucks, DPM    No follow-ups on file.

## 2021-09-11 ENCOUNTER — Telehealth: Payer: Self-pay | Admitting: Podiatry

## 2021-09-11 NOTE — Telephone Encounter (Signed)
Pts friend left message asking when Aaron Edelman is in [place] next week besides Monday or the next available day.  I returned call and pts friend Marzella Schlein) said that Elmyra Ricks took care of it that they were just going to walk in when [facility] was there. I asked when so I could put her at least on the schedule and she said I did not need to.  I asked Elmyra Ricks and she said pts friend Marzella Schlein talked with Aaron Edelman and was told they could walk in on Monday when he is there.

## 2021-09-14 ENCOUNTER — Ambulatory Visit: Payer: PPO

## 2021-09-14 ENCOUNTER — Other Ambulatory Visit: Payer: Self-pay

## 2021-09-14 DIAGNOSIS — Q667 Congenital pes cavus, unspecified foot: Secondary | ICD-10-CM

## 2021-09-14 DIAGNOSIS — M2170 Unequal limb length (acquired), unspecified site: Secondary | ICD-10-CM

## 2021-09-14 NOTE — Progress Notes (Signed)
SITUATION Reason for Visit: Fitting of Orthopedic Shoes Patient / Caregiver Report:  Patient is comfortable in shoes  OBJECTIVE DATA: Patient History / Diagnosis:     ICD-10-CM   1. Pes cavus  Q66.70     2. Acquired unequal limb length  M21.70       Change in Status:   None  ACTIONS PERFORMED: In-Person Delivery, patient was fit with: - 1x pair Orthopedic Shoes: [id number]  Shoes were verified for structural integrity and safety. Patient wore shoes in office. Skin was inspected and free of areas of concern after wearing shoes and inserts. Shoes fit properly. Patient / Caregiver provided with verbal instruction and demonstration regarding donning, doffing, wear, care, proper fit, function, purpose, cleaning, and use of shoes and in all related precautions and risks and benefits regarding shoes and insoles. Patient / Caregiver was instructed to wear properly fitting socks with shoes at all times. Patient was also provided with verbal instruction regarding how to report any failures or malfunctions of shoes, and necessary follow up care. Patient / Caregiver was also instructed to contact physician regarding change in status that may affect function of shoes.   Patient / Caregiver verbalized understanding of instruction provided. Patient / Caregiver demonstrated independence with proper donning and doffing of shoes.  PLAN Patient to follow up as needed. Plan of care was discussed with and agreed upon by patient and/or caregiver. All questions were answered and concerns addressed.

## 2021-09-23 DIAGNOSIS — J342 Deviated nasal septum: Secondary | ICD-10-CM | POA: Diagnosis not present

## 2021-09-24 DIAGNOSIS — M3500 Sicca syndrome, unspecified: Secondary | ICD-10-CM | POA: Diagnosis not present

## 2021-09-24 DIAGNOSIS — E441 Mild protein-calorie malnutrition: Secondary | ICD-10-CM | POA: Diagnosis not present

## 2021-09-24 DIAGNOSIS — Z789 Other specified health status: Secondary | ICD-10-CM | POA: Diagnosis not present

## 2021-09-24 DIAGNOSIS — F3341 Major depressive disorder, recurrent, in partial remission: Secondary | ICD-10-CM | POA: Diagnosis not present

## 2021-09-24 DIAGNOSIS — G20 Parkinson's disease: Secondary | ICD-10-CM | POA: Diagnosis not present

## 2021-09-24 DIAGNOSIS — Z0000 Encounter for general adult medical examination without abnormal findings: Secondary | ICD-10-CM | POA: Diagnosis not present

## 2021-09-28 DIAGNOSIS — F419 Anxiety disorder, unspecified: Secondary | ICD-10-CM | POA: Diagnosis not present

## 2021-09-28 DIAGNOSIS — F331 Major depressive disorder, recurrent, moderate: Secondary | ICD-10-CM | POA: Diagnosis not present

## 2021-09-30 DIAGNOSIS — R2681 Unsteadiness on feet: Secondary | ICD-10-CM | POA: Diagnosis not present

## 2021-09-30 DIAGNOSIS — M6281 Muscle weakness (generalized): Secondary | ICD-10-CM | POA: Diagnosis not present

## 2021-09-30 DIAGNOSIS — R2689 Other abnormalities of gait and mobility: Secondary | ICD-10-CM | POA: Diagnosis not present

## 2021-10-29 DIAGNOSIS — R2689 Other abnormalities of gait and mobility: Secondary | ICD-10-CM | POA: Diagnosis not present

## 2021-10-29 DIAGNOSIS — R2681 Unsteadiness on feet: Secondary | ICD-10-CM | POA: Diagnosis not present

## 2021-10-29 DIAGNOSIS — M6281 Muscle weakness (generalized): Secondary | ICD-10-CM | POA: Diagnosis not present

## 2021-11-06 DIAGNOSIS — F419 Anxiety disorder, unspecified: Secondary | ICD-10-CM | POA: Diagnosis not present

## 2021-11-06 DIAGNOSIS — F331 Major depressive disorder, recurrent, moderate: Secondary | ICD-10-CM | POA: Diagnosis not present

## 2021-11-24 DIAGNOSIS — K219 Gastro-esophageal reflux disease without esophagitis: Secondary | ICD-10-CM | POA: Diagnosis not present

## 2021-11-24 DIAGNOSIS — K581 Irritable bowel syndrome with constipation: Secondary | ICD-10-CM | POA: Diagnosis not present

## 2021-11-24 DIAGNOSIS — K5900 Constipation, unspecified: Secondary | ICD-10-CM | POA: Diagnosis not present

## 2021-11-24 DIAGNOSIS — M797 Fibromyalgia: Secondary | ICD-10-CM | POA: Diagnosis not present

## 2021-11-24 DIAGNOSIS — G20 Parkinson's disease: Secondary | ICD-10-CM | POA: Diagnosis not present

## 2021-11-30 DIAGNOSIS — H01009 Unspecified blepharitis unspecified eye, unspecified eyelid: Secondary | ICD-10-CM | POA: Diagnosis not present

## 2021-11-30 DIAGNOSIS — H04123 Dry eye syndrome of bilateral lacrimal glands: Secondary | ICD-10-CM | POA: Diagnosis not present

## 2021-11-30 DIAGNOSIS — R2681 Unsteadiness on feet: Secondary | ICD-10-CM | POA: Diagnosis not present

## 2021-11-30 DIAGNOSIS — H524 Presbyopia: Secondary | ICD-10-CM | POA: Diagnosis not present

## 2021-11-30 DIAGNOSIS — R1313 Dysphagia, pharyngeal phase: Secondary | ICD-10-CM | POA: Diagnosis not present

## 2021-11-30 DIAGNOSIS — Z961 Presence of intraocular lens: Secondary | ICD-10-CM | POA: Diagnosis not present

## 2021-11-30 DIAGNOSIS — M6281 Muscle weakness (generalized): Secondary | ICD-10-CM | POA: Diagnosis not present

## 2021-11-30 DIAGNOSIS — R2689 Other abnormalities of gait and mobility: Secondary | ICD-10-CM | POA: Diagnosis not present

## 2021-12-02 DIAGNOSIS — F330 Major depressive disorder, recurrent, mild: Secondary | ICD-10-CM | POA: Diagnosis not present

## 2021-12-02 DIAGNOSIS — K224 Dyskinesia of esophagus: Secondary | ICD-10-CM | POA: Diagnosis not present

## 2021-12-02 DIAGNOSIS — K581 Irritable bowel syndrome with constipation: Secondary | ICD-10-CM | POA: Diagnosis not present

## 2021-12-02 DIAGNOSIS — M3500 Sicca syndrome, unspecified: Secondary | ICD-10-CM | POA: Diagnosis not present

## 2021-12-02 DIAGNOSIS — G20 Parkinson's disease: Secondary | ICD-10-CM | POA: Diagnosis not present

## 2021-12-02 DIAGNOSIS — F418 Other specified anxiety disorders: Secondary | ICD-10-CM | POA: Diagnosis not present

## 2021-12-02 DIAGNOSIS — N3010 Interstitial cystitis (chronic) without hematuria: Secondary | ICD-10-CM | POA: Diagnosis not present

## 2021-12-02 DIAGNOSIS — M797 Fibromyalgia: Secondary | ICD-10-CM | POA: Diagnosis not present

## 2021-12-02 DIAGNOSIS — R1313 Dysphagia, pharyngeal phase: Secondary | ICD-10-CM | POA: Diagnosis not present

## 2021-12-02 DIAGNOSIS — K219 Gastro-esophageal reflux disease without esophagitis: Secondary | ICD-10-CM | POA: Diagnosis not present

## 2021-12-02 DIAGNOSIS — K5900 Constipation, unspecified: Secondary | ICD-10-CM | POA: Diagnosis not present

## 2021-12-02 DIAGNOSIS — M810 Age-related osteoporosis without current pathological fracture: Secondary | ICD-10-CM | POA: Diagnosis not present

## 2021-12-04 DIAGNOSIS — Z79899 Other long term (current) drug therapy: Secondary | ICD-10-CM | POA: Diagnosis not present

## 2021-12-08 DIAGNOSIS — F419 Anxiety disorder, unspecified: Secondary | ICD-10-CM | POA: Diagnosis not present

## 2021-12-08 DIAGNOSIS — F331 Major depressive disorder, recurrent, moderate: Secondary | ICD-10-CM | POA: Diagnosis not present

## 2021-12-17 ENCOUNTER — Ambulatory Visit: Payer: PPO | Admitting: Podiatry

## 2021-12-17 ENCOUNTER — Ambulatory Visit (INDEPENDENT_AMBULATORY_CARE_PROVIDER_SITE_OTHER): Payer: PPO | Admitting: Podiatry

## 2021-12-17 ENCOUNTER — Encounter: Payer: Self-pay | Admitting: Podiatry

## 2021-12-17 DIAGNOSIS — M79675 Pain in left toe(s): Secondary | ICD-10-CM | POA: Diagnosis not present

## 2021-12-17 DIAGNOSIS — Q667 Congenital pes cavus, unspecified foot: Secondary | ICD-10-CM

## 2021-12-17 DIAGNOSIS — M79674 Pain in right toe(s): Secondary | ICD-10-CM | POA: Diagnosis not present

## 2021-12-17 DIAGNOSIS — B351 Tinea unguium: Secondary | ICD-10-CM

## 2021-12-17 NOTE — Progress Notes (Signed)
This patient returns to the office for evaluation and treatment of long thick painful nails .  This patient is unable to trim his own nails since the patient cannot reach his feet.  Patient says the nails are painful walking and wearing his shoes.  He returns for preventive foot care services.  General Appearance  Alert, conversant and in no acute stress.  Vascular  Dorsalis pedis and posterior tibial  pulses are palpable  bilaterally.  Capillary return is within normal limits  bilaterally. Temperature is within normal limits  bilaterally.  Neurologic  Senn-Weinstein monofilament wire test within normal limits  bilaterally. Muscle power within normal limits bilaterally.  Nails Thick disfigured discolored nails with subungual debris  from hallux to fifth toes bilaterally. No evidence of bacterial infection or drainage bilaterally.  Orthopedic  No limitations of motion  feet .  No crepitus or effusions noted.  No bony pathology or digital deformities noted.  Pes cavus.  Skin  normotropic skin with no porokeratosis noted bilaterally.  No signs of infections or ulcers noted.     Onychomycosis  Pain in toes right foot  Pain in toes left foot  Debridement  of nails  1-5  B/L with a nail nipper.  Nails were then filed using a dremel tool with no incidents.    RTC 4 months   Lassie Demorest DPM   

## 2021-12-21 DIAGNOSIS — M5459 Other low back pain: Secondary | ICD-10-CM | POA: Diagnosis not present

## 2021-12-23 DIAGNOSIS — R5382 Chronic fatigue, unspecified: Secondary | ICD-10-CM | POA: Diagnosis not present

## 2021-12-23 DIAGNOSIS — F419 Anxiety disorder, unspecified: Secondary | ICD-10-CM | POA: Diagnosis not present

## 2021-12-25 DIAGNOSIS — K219 Gastro-esophageal reflux disease without esophagitis: Secondary | ICD-10-CM | POA: Diagnosis not present

## 2021-12-25 DIAGNOSIS — K5900 Constipation, unspecified: Secondary | ICD-10-CM | POA: Diagnosis not present

## 2021-12-25 DIAGNOSIS — M797 Fibromyalgia: Secondary | ICD-10-CM | POA: Diagnosis not present

## 2021-12-25 DIAGNOSIS — K581 Irritable bowel syndrome with constipation: Secondary | ICD-10-CM | POA: Diagnosis not present

## 2021-12-25 DIAGNOSIS — G20 Parkinson's disease: Secondary | ICD-10-CM | POA: Diagnosis not present

## 2021-12-29 DIAGNOSIS — M6281 Muscle weakness (generalized): Secondary | ICD-10-CM | POA: Diagnosis not present

## 2021-12-29 DIAGNOSIS — R1313 Dysphagia, pharyngeal phase: Secondary | ICD-10-CM | POA: Diagnosis not present

## 2021-12-29 DIAGNOSIS — R471 Dysarthria and anarthria: Secondary | ICD-10-CM | POA: Diagnosis not present

## 2022-01-08 DIAGNOSIS — F419 Anxiety disorder, unspecified: Secondary | ICD-10-CM | POA: Diagnosis not present

## 2022-01-08 DIAGNOSIS — F331 Major depressive disorder, recurrent, moderate: Secondary | ICD-10-CM | POA: Diagnosis not present

## 2022-01-11 DIAGNOSIS — R5383 Other fatigue: Secondary | ICD-10-CM | POA: Diagnosis not present

## 2022-01-22 DIAGNOSIS — G20 Parkinson's disease: Secondary | ICD-10-CM | POA: Diagnosis not present

## 2022-01-22 DIAGNOSIS — K581 Irritable bowel syndrome with constipation: Secondary | ICD-10-CM | POA: Diagnosis not present

## 2022-01-22 DIAGNOSIS — K219 Gastro-esophageal reflux disease without esophagitis: Secondary | ICD-10-CM | POA: Diagnosis not present

## 2022-01-27 DIAGNOSIS — R5382 Chronic fatigue, unspecified: Secondary | ICD-10-CM | POA: Diagnosis not present

## 2022-01-27 DIAGNOSIS — F419 Anxiety disorder, unspecified: Secondary | ICD-10-CM | POA: Diagnosis not present

## 2022-01-28 DIAGNOSIS — K219 Gastro-esophageal reflux disease without esophagitis: Secondary | ICD-10-CM | POA: Diagnosis not present

## 2022-01-28 DIAGNOSIS — M797 Fibromyalgia: Secondary | ICD-10-CM | POA: Diagnosis not present

## 2022-01-28 DIAGNOSIS — M3500 Sicca syndrome, unspecified: Secondary | ICD-10-CM | POA: Diagnosis not present

## 2022-01-28 DIAGNOSIS — R300 Dysuria: Secondary | ICD-10-CM | POA: Diagnosis not present

## 2022-01-28 DIAGNOSIS — R471 Dysarthria and anarthria: Secondary | ICD-10-CM | POA: Diagnosis not present

## 2022-01-28 DIAGNOSIS — K581 Irritable bowel syndrome with constipation: Secondary | ICD-10-CM | POA: Diagnosis not present

## 2022-01-28 DIAGNOSIS — R1313 Dysphagia, pharyngeal phase: Secondary | ICD-10-CM | POA: Diagnosis not present

## 2022-01-28 DIAGNOSIS — M6281 Muscle weakness (generalized): Secondary | ICD-10-CM | POA: Diagnosis not present

## 2022-01-28 DIAGNOSIS — G20 Parkinson's disease: Secondary | ICD-10-CM | POA: Diagnosis not present

## 2022-01-28 DIAGNOSIS — K224 Dyskinesia of esophagus: Secondary | ICD-10-CM | POA: Diagnosis not present

## 2022-01-28 DIAGNOSIS — N3010 Interstitial cystitis (chronic) without hematuria: Secondary | ICD-10-CM | POA: Diagnosis not present

## 2022-01-29 DIAGNOSIS — N390 Urinary tract infection, site not specified: Secondary | ICD-10-CM | POA: Diagnosis not present

## 2022-02-05 DIAGNOSIS — F331 Major depressive disorder, recurrent, moderate: Secondary | ICD-10-CM | POA: Diagnosis not present

## 2022-02-05 DIAGNOSIS — H0288B Meibomian gland dysfunction left eye, upper and lower eyelids: Secondary | ICD-10-CM | POA: Diagnosis not present

## 2022-02-05 DIAGNOSIS — Z961 Presence of intraocular lens: Secondary | ICD-10-CM | POA: Diagnosis not present

## 2022-02-05 DIAGNOSIS — H04123 Dry eye syndrome of bilateral lacrimal glands: Secondary | ICD-10-CM | POA: Diagnosis not present

## 2022-02-05 DIAGNOSIS — H0288A Meibomian gland dysfunction right eye, upper and lower eyelids: Secondary | ICD-10-CM | POA: Diagnosis not present

## 2022-02-05 DIAGNOSIS — F419 Anxiety disorder, unspecified: Secondary | ICD-10-CM | POA: Diagnosis not present

## 2022-02-15 DIAGNOSIS — H1011 Acute atopic conjunctivitis, right eye: Secondary | ICD-10-CM | POA: Diagnosis not present

## 2022-02-22 DIAGNOSIS — N3281 Overactive bladder: Secondary | ICD-10-CM | POA: Diagnosis not present

## 2022-02-22 DIAGNOSIS — F419 Anxiety disorder, unspecified: Secondary | ICD-10-CM | POA: Diagnosis not present

## 2022-02-22 DIAGNOSIS — J3089 Other allergic rhinitis: Secondary | ICD-10-CM | POA: Diagnosis not present

## 2022-02-22 DIAGNOSIS — R5382 Chronic fatigue, unspecified: Secondary | ICD-10-CM | POA: Diagnosis not present

## 2022-02-22 DIAGNOSIS — E559 Vitamin D deficiency, unspecified: Secondary | ICD-10-CM | POA: Diagnosis not present

## 2022-02-23 DIAGNOSIS — K5900 Constipation, unspecified: Secondary | ICD-10-CM | POA: Diagnosis not present

## 2022-02-28 DIAGNOSIS — R1313 Dysphagia, pharyngeal phase: Secondary | ICD-10-CM | POA: Diagnosis not present

## 2022-02-28 DIAGNOSIS — R471 Dysarthria and anarthria: Secondary | ICD-10-CM | POA: Diagnosis not present

## 2022-03-12 DIAGNOSIS — F331 Major depressive disorder, recurrent, moderate: Secondary | ICD-10-CM | POA: Diagnosis not present

## 2022-03-12 DIAGNOSIS — F419 Anxiety disorder, unspecified: Secondary | ICD-10-CM | POA: Diagnosis not present

## 2022-03-19 DIAGNOSIS — K219 Gastro-esophageal reflux disease without esophagitis: Secondary | ICD-10-CM | POA: Diagnosis not present

## 2022-03-19 DIAGNOSIS — K581 Irritable bowel syndrome with constipation: Secondary | ICD-10-CM | POA: Diagnosis not present

## 2022-03-19 DIAGNOSIS — G20 Parkinson's disease: Secondary | ICD-10-CM | POA: Diagnosis not present

## 2022-03-24 DIAGNOSIS — H0288B Meibomian gland dysfunction left eye, upper and lower eyelids: Secondary | ICD-10-CM | POA: Diagnosis not present

## 2022-03-24 DIAGNOSIS — H0288A Meibomian gland dysfunction right eye, upper and lower eyelids: Secondary | ICD-10-CM | POA: Diagnosis not present

## 2022-03-24 DIAGNOSIS — H04123 Dry eye syndrome of bilateral lacrimal glands: Secondary | ICD-10-CM | POA: Diagnosis not present

## 2022-03-26 DIAGNOSIS — R1313 Dysphagia, pharyngeal phase: Secondary | ICD-10-CM | POA: Diagnosis not present

## 2022-03-26 DIAGNOSIS — F419 Anxiety disorder, unspecified: Secondary | ICD-10-CM | POA: Diagnosis not present

## 2022-03-26 DIAGNOSIS — R5382 Chronic fatigue, unspecified: Secondary | ICD-10-CM | POA: Diagnosis not present

## 2022-03-30 DIAGNOSIS — R471 Dysarthria and anarthria: Secondary | ICD-10-CM | POA: Diagnosis not present

## 2022-03-30 DIAGNOSIS — J302 Other seasonal allergic rhinitis: Secondary | ICD-10-CM | POA: Diagnosis not present

## 2022-03-30 DIAGNOSIS — M3500 Sicca syndrome, unspecified: Secondary | ICD-10-CM | POA: Diagnosis not present

## 2022-03-30 DIAGNOSIS — N3010 Interstitial cystitis (chronic) without hematuria: Secondary | ICD-10-CM | POA: Diagnosis not present

## 2022-03-30 DIAGNOSIS — M6281 Muscle weakness (generalized): Secondary | ICD-10-CM | POA: Diagnosis not present

## 2022-03-30 DIAGNOSIS — G20 Parkinson's disease: Secondary | ICD-10-CM | POA: Diagnosis not present

## 2022-03-30 DIAGNOSIS — K581 Irritable bowel syndrome with constipation: Secondary | ICD-10-CM | POA: Diagnosis not present

## 2022-03-30 DIAGNOSIS — R1313 Dysphagia, pharyngeal phase: Secondary | ICD-10-CM | POA: Diagnosis not present

## 2022-04-02 DIAGNOSIS — F419 Anxiety disorder, unspecified: Secondary | ICD-10-CM | POA: Diagnosis not present

## 2022-04-02 DIAGNOSIS — F331 Major depressive disorder, recurrent, moderate: Secondary | ICD-10-CM | POA: Diagnosis not present

## 2022-04-19 DIAGNOSIS — J3089 Other allergic rhinitis: Secondary | ICD-10-CM | POA: Diagnosis not present

## 2022-04-19 DIAGNOSIS — E559 Vitamin D deficiency, unspecified: Secondary | ICD-10-CM | POA: Diagnosis not present

## 2022-04-19 DIAGNOSIS — N3281 Overactive bladder: Secondary | ICD-10-CM | POA: Diagnosis not present

## 2022-04-19 DIAGNOSIS — F331 Major depressive disorder, recurrent, moderate: Secondary | ICD-10-CM | POA: Diagnosis not present

## 2022-04-27 DIAGNOSIS — R5382 Chronic fatigue, unspecified: Secondary | ICD-10-CM | POA: Diagnosis not present

## 2022-04-27 DIAGNOSIS — F419 Anxiety disorder, unspecified: Secondary | ICD-10-CM | POA: Diagnosis not present

## 2022-04-27 DIAGNOSIS — M6281 Muscle weakness (generalized): Secondary | ICD-10-CM | POA: Diagnosis not present

## 2022-04-27 DIAGNOSIS — G20 Parkinson's disease: Secondary | ICD-10-CM | POA: Diagnosis not present

## 2022-04-28 DIAGNOSIS — F331 Major depressive disorder, recurrent, moderate: Secondary | ICD-10-CM | POA: Diagnosis not present

## 2022-04-28 DIAGNOSIS — K589 Irritable bowel syndrome without diarrhea: Secondary | ICD-10-CM | POA: Diagnosis not present

## 2022-04-28 DIAGNOSIS — E441 Mild protein-calorie malnutrition: Secondary | ICD-10-CM | POA: Diagnosis not present

## 2022-04-28 DIAGNOSIS — Z515 Encounter for palliative care: Secondary | ICD-10-CM | POA: Diagnosis not present

## 2022-04-28 DIAGNOSIS — Z7189 Other specified counseling: Secondary | ICD-10-CM | POA: Diagnosis not present

## 2022-04-28 DIAGNOSIS — M3500 Sicca syndrome, unspecified: Secondary | ICD-10-CM | POA: Diagnosis not present

## 2022-04-28 DIAGNOSIS — G20 Parkinson's disease: Secondary | ICD-10-CM | POA: Diagnosis not present

## 2022-04-28 DIAGNOSIS — K224 Dyskinesia of esophagus: Secondary | ICD-10-CM | POA: Diagnosis not present

## 2022-04-28 DIAGNOSIS — F419 Anxiety disorder, unspecified: Secondary | ICD-10-CM | POA: Diagnosis not present

## 2022-04-28 DIAGNOSIS — R1313 Dysphagia, pharyngeal phase: Secondary | ICD-10-CM | POA: Diagnosis not present

## 2022-04-28 DIAGNOSIS — M797 Fibromyalgia: Secondary | ICD-10-CM | POA: Diagnosis not present

## 2022-04-28 DIAGNOSIS — M810 Age-related osteoporosis without current pathological fracture: Secondary | ICD-10-CM | POA: Diagnosis not present

## 2022-04-29 ENCOUNTER — Ambulatory Visit (INDEPENDENT_AMBULATORY_CARE_PROVIDER_SITE_OTHER): Payer: PPO | Admitting: Podiatry

## 2022-04-29 ENCOUNTER — Encounter: Payer: Self-pay | Admitting: Podiatry

## 2022-04-29 DIAGNOSIS — M79675 Pain in left toe(s): Secondary | ICD-10-CM | POA: Diagnosis not present

## 2022-04-29 DIAGNOSIS — B351 Tinea unguium: Secondary | ICD-10-CM | POA: Diagnosis not present

## 2022-04-29 DIAGNOSIS — M79674 Pain in right toe(s): Secondary | ICD-10-CM

## 2022-04-29 DIAGNOSIS — Q667 Congenital pes cavus, unspecified foot: Secondary | ICD-10-CM

## 2022-04-29 NOTE — Progress Notes (Signed)
This patient returns to the office for evaluation and treatment of long thick painful nails .  This patient is unable to trim his own nails since the patient cannot reach his feet.  Patient says the nails are painful walking and wearing his shoes.  He returns for preventive foot care services.  General Appearance  Alert, conversant and in no acute stress.  Vascular  Dorsalis pedis and posterior tibial  pulses are palpable  bilaterally.  Capillary return is within normal limits  bilaterally. Temperature is within normal limits  bilaterally.  Neurologic  Senn-Weinstein monofilament wire test within normal limits  bilaterally. Muscle power within normal limits bilaterally.  Nails Thick disfigured discolored nails with subungual debris  from hallux to fifth toes bilaterally. No evidence of bacterial infection or drainage bilaterally.  Orthopedic  No limitations of motion  feet .  No crepitus or effusions noted.  No bony pathology or digital deformities noted.  Pes cavus.  Skin  normotropic skin with no porokeratosis noted bilaterally.  No signs of infections or ulcers noted.     Onychomycosis  Pain in toes right foot  Pain in toes left foot  Debridement  of nails  1-5  B/L with a nail nipper.  Nails were then filed using a dremel tool with no incidents.    RTC 4 months   Nichola Cieslinski DPM   

## 2022-04-30 DIAGNOSIS — F419 Anxiety disorder, unspecified: Secondary | ICD-10-CM | POA: Diagnosis not present

## 2022-04-30 DIAGNOSIS — F331 Major depressive disorder, recurrent, moderate: Secondary | ICD-10-CM | POA: Diagnosis not present

## 2022-05-05 DIAGNOSIS — R1313 Dysphagia, pharyngeal phase: Secondary | ICD-10-CM | POA: Diagnosis not present

## 2022-05-05 DIAGNOSIS — R471 Dysarthria and anarthria: Secondary | ICD-10-CM | POA: Diagnosis not present

## 2022-05-05 DIAGNOSIS — R2689 Other abnormalities of gait and mobility: Secondary | ICD-10-CM | POA: Diagnosis not present

## 2022-05-05 DIAGNOSIS — R2681 Unsteadiness on feet: Secondary | ICD-10-CM | POA: Diagnosis not present

## 2022-05-05 DIAGNOSIS — M6281 Muscle weakness (generalized): Secondary | ICD-10-CM | POA: Diagnosis not present

## 2022-05-10 DIAGNOSIS — F331 Major depressive disorder, recurrent, moderate: Secondary | ICD-10-CM | POA: Diagnosis not present

## 2022-05-24 DIAGNOSIS — K581 Irritable bowel syndrome with constipation: Secondary | ICD-10-CM | POA: Diagnosis not present

## 2022-05-24 DIAGNOSIS — G20 Parkinson's disease: Secondary | ICD-10-CM | POA: Diagnosis not present

## 2022-05-24 DIAGNOSIS — K219 Gastro-esophageal reflux disease without esophagitis: Secondary | ICD-10-CM | POA: Diagnosis not present

## 2022-05-31 DIAGNOSIS — R2681 Unsteadiness on feet: Secondary | ICD-10-CM | POA: Diagnosis not present

## 2022-05-31 DIAGNOSIS — Z23 Encounter for immunization: Secondary | ICD-10-CM | POA: Diagnosis not present

## 2022-05-31 DIAGNOSIS — R2689 Other abnormalities of gait and mobility: Secondary | ICD-10-CM | POA: Diagnosis not present

## 2022-06-01 ENCOUNTER — Telehealth: Payer: Self-pay

## 2022-06-01 NOTE — Chronic Care Management (AMB) (Signed)
  Care Coordination  Outreach Note  06/01/2022 Name: CHAI VERDEJO MRN: [id number] DOB: Feb 05, 1941   Care Coordination Outreach Attempts: An unsuccessful telephone outreach was attempted today to offer the patient information about available care coordination services as a benefit of their health plan.   Follow Up Plan:  Additional outreach attempts will be made to offer the patient care coordination information and services.   Encounter Outcome:  No Answer  Sig Noreene Larsson, [place], [place] [address] Direct Dial: [phone] [email or web address]

## 2022-06-08 DIAGNOSIS — M3500 Sicca syndrome, unspecified: Secondary | ICD-10-CM | POA: Diagnosis not present

## 2022-06-08 DIAGNOSIS — K581 Irritable bowel syndrome with constipation: Secondary | ICD-10-CM | POA: Diagnosis not present

## 2022-06-08 DIAGNOSIS — K5900 Constipation, unspecified: Secondary | ICD-10-CM | POA: Diagnosis not present

## 2022-06-08 DIAGNOSIS — N3010 Interstitial cystitis (chronic) without hematuria: Secondary | ICD-10-CM | POA: Diagnosis not present

## 2022-06-08 DIAGNOSIS — M6281 Muscle weakness (generalized): Secondary | ICD-10-CM | POA: Diagnosis not present

## 2022-06-08 DIAGNOSIS — G20C Parkinsonism, unspecified: Secondary | ICD-10-CM | POA: Diagnosis not present

## 2022-06-18 DIAGNOSIS — F331 Major depressive disorder, recurrent, moderate: Secondary | ICD-10-CM | POA: Diagnosis not present

## 2022-06-18 DIAGNOSIS — F419 Anxiety disorder, unspecified: Secondary | ICD-10-CM | POA: Diagnosis not present

## 2022-06-21 DIAGNOSIS — R5382 Chronic fatigue, unspecified: Secondary | ICD-10-CM | POA: Diagnosis not present

## 2022-06-21 DIAGNOSIS — J3089 Other allergic rhinitis: Secondary | ICD-10-CM | POA: Diagnosis not present

## 2022-06-21 DIAGNOSIS — F419 Anxiety disorder, unspecified: Secondary | ICD-10-CM | POA: Diagnosis not present

## 2022-06-21 DIAGNOSIS — E559 Vitamin D deficiency, unspecified: Secondary | ICD-10-CM | POA: Diagnosis not present

## 2022-06-21 DIAGNOSIS — N3281 Overactive bladder: Secondary | ICD-10-CM | POA: Diagnosis not present

## 2022-07-01 ENCOUNTER — Telehealth: Payer: Self-pay | Admitting: *Deleted

## 2022-07-01 DIAGNOSIS — Z515 Encounter for palliative care: Secondary | ICD-10-CM | POA: Diagnosis not present

## 2022-07-01 DIAGNOSIS — F331 Major depressive disorder, recurrent, moderate: Secondary | ICD-10-CM | POA: Diagnosis not present

## 2022-07-01 DIAGNOSIS — M797 Fibromyalgia: Secondary | ICD-10-CM | POA: Diagnosis not present

## 2022-07-01 DIAGNOSIS — E441 Mild protein-calorie malnutrition: Secondary | ICD-10-CM | POA: Diagnosis not present

## 2022-07-01 DIAGNOSIS — G20C Parkinsonism, unspecified: Secondary | ICD-10-CM | POA: Diagnosis not present

## 2022-07-01 DIAGNOSIS — R1313 Dysphagia, pharyngeal phase: Secondary | ICD-10-CM | POA: Diagnosis not present

## 2022-07-01 DIAGNOSIS — M810 Age-related osteoporosis without current pathological fracture: Secondary | ICD-10-CM | POA: Diagnosis not present

## 2022-07-01 DIAGNOSIS — F419 Anxiety disorder, unspecified: Secondary | ICD-10-CM | POA: Diagnosis not present

## 2022-07-01 DIAGNOSIS — Z7189 Other specified counseling: Secondary | ICD-10-CM | POA: Diagnosis not present

## 2022-07-01 DIAGNOSIS — K589 Irritable bowel syndrome without diarrhea: Secondary | ICD-10-CM | POA: Diagnosis not present

## 2022-07-01 DIAGNOSIS — K224 Dyskinesia of esophagus: Secondary | ICD-10-CM | POA: Diagnosis not present

## 2022-07-01 DIAGNOSIS — M3500 Sicca syndrome, unspecified: Secondary | ICD-10-CM | POA: Diagnosis not present

## 2022-07-01 NOTE — Telephone Encounter (Signed)
Pt 's POA  Neoma Laming called back an stated that she will need to be seen  because pt is not able to swallow medication Uribel ,  and possible UTI that is being treated at the [facility] . The PA is doing a UA on her today , will know the results of that on tomorrow. Also mention to her  that the pt really didn't need to be seen for this for trouble swallowing medication if she has follow up with [facility] recently  she can reach out to them see if an note can put up for the Doctor regarding this. She didn't understood why she couldn't seen . I explained to her that couldn't be seen however , give he medical issues, and since she has already has care [facility]  there wasn't need to re establish here. Advised to call to [facility] to see if they  will be willing to put up an note , and we will not cancel her appt here,  until she gets answer from them.

## 2022-07-01 NOTE — Telephone Encounter (Signed)
Neoma Laming [facility]) called back and stated that she called [facility], and they cannot assist since patient has not been seen there since 2017. Neoma Laming states they will be here Monday for appt with Dr. Matilde Sprang.

## 2022-07-01 NOTE — Telephone Encounter (Signed)
Pt's caregiver, Osie Cheeks, returned your call and said to give her a call at 3:30 or 4 today, she called during her 30 minute lunch break and can't take calls at work.  She just wanted you to know that she got your message.

## 2022-07-01 NOTE — Telephone Encounter (Signed)
Called both POA to get more information on why patient is coming in, had to leave message. Also called [facility] to speak with nurse and she states pt always thinking she has UTI and they are all negative. Pt is not having any symptoms, she has a lot of medical problems but nothing urinary per [employer]. Pt will see the house dr today and they will test the urine again. Per [employer] pt is hospice approved but she keeps declining. Please advise

## 2022-07-05 ENCOUNTER — Ambulatory Visit: Payer: PPO | Admitting: Urology

## 2022-07-05 ENCOUNTER — Encounter: Payer: Self-pay | Admitting: Urology

## 2022-07-05 VITALS — BP 135/76 | HR 93 | Ht 65.0 in | Wt 120.0 lb

## 2022-07-05 DIAGNOSIS — N3941 Urge incontinence: Secondary | ICD-10-CM | POA: Diagnosis not present

## 2022-07-05 DIAGNOSIS — N3020 Other chronic cystitis without hematuria: Secondary | ICD-10-CM

## 2022-07-05 DIAGNOSIS — Z87440 Personal history of urinary (tract) infections: Secondary | ICD-10-CM | POA: Diagnosis not present

## 2022-07-05 LAB — URINALYSIS, COMPLETE
Bilirubin, UA: NEGATIVE
Glucose, UA: NEGATIVE
Nitrite, UA: POSITIVE — AB
RBC, UA: NEGATIVE
Specific Gravity, UA: 1.02 (ref 1.005–1.030)
Urobilinogen, Ur: 1 mg/dL (ref 0.2–1.0)
pH, UA: 7 (ref 5.0–7.5)

## 2022-07-05 LAB — MICROSCOPIC EXAMINATION

## 2022-07-05 NOTE — Progress Notes (Signed)
07/05/2022 3:17 PM   Kaitlyn Church November 20, 1940 [id number]  Referring provider: Kirk Ruths, MD [place] [facility] [place],  [place] [address]  Chief Complaint  Patient presents with   Urinary Incontinence    HPI: Ms Medine is a 81yo here for followup for chronic cystitis. She was treated with vantin for a UTI whch resolved her dysuria and urgency. She is very happy with uribel       Last visit The patient is well known to myself from [place]. She has positive and negative cultures documented shown to me by her family. Cultures sent and she was doing well on the uribel   Today Frequency is stable Clinically noninfected   The uribel still works very well twice a day. They need a prescription to take 6 the morning and 6 at night. She has a little bit of breakthrough symptoms. Her daughter continues get cultures once or twice at the 7 times they've been positive in the last year or so they are very good at getting the cultures now     Today Patient recently fractured her hip.  Parkinson's is getting worse so she cannot swallow the Uribel.  She says she has suprapubic pain and sometimes low left back pain since.  The cannot cross the Uribel.  Clinically not infected.  Daughter says she has not been getting bladder infections.  I have not seen her in my office for a number years.  She is allergic to ciprofloxacin doxycycline Macrobid Myrbetriq and trimethoprim  PMH: Past Medical History:  Diagnosis Date   Abdominal pain, right upper quadrant    Allergic rhinitis    Anxiety    Depression    Esophageal motility disorder    Fibromyalgia    Gait disturbance    GERD (gastroesophageal reflux disease)    Grief reaction    Hemorrhoids, internal, thrombosed    Interstitial cystitis    Irritable bowel syndrome    Osteoporosis    Parkinson disease    Restless leg syndrome    Shingles    Sjogren's syndrome ([place])    ?   Trochanteric bursitis     bilateral   Vaginitis     Surgical History: Past Surgical History:  Procedure Laterality Date   ABDOMINAL HYSTERECTOMY  10/2002   APPENDECTOMY  11/1969   CYSTOCELE REPAIR  1980   ESOPHAGOGASTRODUODENOSCOPY  01/2001   nl   ESOPHAGOGASTRODUODENOSCOPY  09/2006   Normal ([facility])   EXPLORATORY LAPAROTOMY  09/1969   HERNIA REPAIR  1980   HIP ARTHROPLASTY Right 12/20/2019   Procedure: ARTHROPLASTY BIPOLAR HIP (HEMIARTHROPLASTY);  Surgeon: Leim Fabry, MD;  Location: [facility] ORS;  Service: Orthopedics;  Laterality: Right;   NASAL SEPTUM SURGERY  11/1976   POLYPECTOMY  03/1972   Bladder   RECTOCELE REPAIR  1980   TONSILLECTOMY  1948    Home Medications:  Allergies as of 07/05/2022       Reactions   Lidocaine Other (See Comments)   Other Other (See Comments)   darvocet. darvocet - HALLUCINATIONS   Propoxyphene Other (See Comments)   Other Reaction: hallucinations, weird dreams   Ascorbic Acid Other (See Comments)   Ascorbic Acid Other (See Comments)   Azithromycin Other (See Comments)   Other Reaction: sore, swollen tongue   Basil Oil Other (See Comments)   Calcium Other (See Comments)   Celecoxib Other (See Comments)   Cephalexin    REACTION: felt tired but tolerated   Dexamethasone  Other (See Comments)   Other Reaction: insomnia, chest spasms   Doxycycline Hives   Itraconazole Other (See Comments)   Meperidine Hcl Other (See Comments)   Mirabegron Other (See Comments)   intolerant   Morphine Hives   Morphine Sulfate    Nitrofurantoin Other (See Comments)   Swollen lip and tongue Blisters on tongue   Origanum Oil Other (See Comments)   Pantoprazole Sodium Other (See Comments)   REACTION: diarrhea REACTION: diarrhea   Penicillins Other (See Comments)   Has patient had a PCN reaction causing immediate rash, facial/tongue/throat swelling, SOB or lightheadedness with hypotension: Unknown Has patient had a PCN reaction causing severe rash involving mucus membranes or  skin necrosis: Unknown Has patient had a PCN reaction that required hospitalization: Unknown Has patient had a PCN reaction occurring within the last 10 years: Unknown If all of the above answers are "NO", then may proceed with Cephalosporin use.   Propoxyphene Hcl    Sulfa Antibiotics Other (See Comments)   Swollen lip and tongue Blisters on tongue   Sulfamethoxazole-trimethoprim Other (See Comments)   Thimerosal (thiomersal) Other (See Comments)   Eye burning        Medication List        Accurate as of July 05, 2022  3:17 PM. If you have any questions, ask your nurse or doctor.          acetaminophen 500 MG tablet Commonly known as: TYLENOL Take 500 mg by mouth every 4 (four) hours as needed.   alum & mag hydroxide-simeth 200-200-20 MG/5ML suspension Commonly known as: MAALOX/MYLANTA Take 30 mLs by mouth 4 (four) times daily as needed for indigestion or heartburn.   antiseptic oral rinse Liqd 15 mLs by Mouth Rinse route as needed for dry mouth.   carbidopa-levodopa 25-100 MG tablet Commonly known as: SINEMET IR Take 1.5 tablets by mouth 5 (five) times daily. (0600, 1030, 1445, 1830, 2230)   ciclopirox 0.77 % cream Commonly known as: LOPROX Apply topically.   dimenhyDRINATE 50 MG tablet Commonly known as: DRAMAMINE Take 50 mg by mouth every 4 (four) hours as needed (muscle spasms).   enoxaparin 40 MG/0.4ML injection Commonly known as: LOVENOX Inject 0.4 mLs (40 mg total) into the skin daily for 14 doses.   estrogens (conjugated) 0.625 MG tablet Commonly known as: PREMARIN Take 0.625 mg by mouth daily.   FLUoxetine 10 MG tablet Commonly known as: PROZAC Take 5 mg by mouth daily.   ibuprofen 200 MG tablet Commonly known as: ADVIL Take 200 mg by mouth 3 (three) times daily.   loperamide 2 MG capsule Commonly known as: IMODIUM Take 2 mg by mouth as needed for diarrhea or loose stools. (max 8 doses daily)   loratadine 10 MG tablet Commonly known  as: CLARITIN Take 5 mg by mouth daily as needed for allergies.   magnesium hydroxide 400 MG/5ML suspension Commonly known as: MILK OF MAGNESIA Take 30 mLs by mouth at bedtime as needed for mild constipation.   magnesium oxide 400 MG tablet Commonly known as: MAG-OX Take 400 mg by mouth at bedtime as needed (muscle cramps).   neomycin-bacitracin-polymyxin [phone] Oint Apply 1 application topically daily as needed (wound care).   oxyCODONE 5 MG immediate release tablet Commonly known as: Oxy IR/ROXICODONE Take 0.5-1 tablets (2.5-5 mg total) by mouth every 4 (four) hours as needed for moderate pain or severe pain (pain score 4-6).   Preparation H 0.25-88.44 % suppository Generic drug: shark liver oil-cocoa butter Place 1 suppository  rectally as needed for hemorrhoids.   Robafen 100 MG/5ML syrup Generic drug: guaifenesin Take 200 mg by mouth 4 (four) times daily as needed for cough.   traMADol 50 MG tablet Commonly known as: ULTRAM Take 1 tablet (50 mg total) by mouth every 6 (six) hours as needed for moderate pain.   Uribel 118 MG Caps Take 118 mg by mouth in the morning and at bedtime.   Xiidra 5 % Soln Generic drug: Lifitegrast Place 1 drop into both eyes 2 (two) times daily.        Allergies:  Allergies  Allergen Reactions   Lidocaine Other (See Comments)   Other Other (See Comments)    darvocet. darvocet - HALLUCINATIONS   Propoxyphene Other (See Comments)    Other Reaction: hallucinations, weird dreams   Ascorbic Acid Other (See Comments)   Ascorbic Acid Other (See Comments)   Azithromycin Other (See Comments)    Other Reaction: sore, swollen tongue   Basil Oil Other (See Comments)   Calcium Other (See Comments)   Celecoxib Other (See Comments)   Cephalexin     REACTION: felt tired but tolerated   Dexamethasone Other (See Comments)    Other Reaction: insomnia, chest spasms   Doxycycline Hives   Itraconazole Other (See Comments)   Meperidine Hcl  Other (See Comments)   Mirabegron Other (See Comments)    intolerant   Morphine Hives   Morphine Sulfate    Nitrofurantoin Other (See Comments)    Swollen lip and tongue Blisters on tongue   Origanum Oil Other (See Comments)   Pantoprazole Sodium Other (See Comments)    REACTION: diarrhea REACTION: diarrhea   Penicillins Other (See Comments)    Has patient had a PCN reaction causing immediate rash, facial/tongue/throat swelling, SOB or lightheadedness with hypotension: Unknown Has patient had a PCN reaction causing severe rash involving mucus membranes or skin necrosis: Unknown Has patient had a PCN reaction that required hospitalization: Unknown Has patient had a PCN reaction occurring within the last 10 years: Unknown If all of the above answers are "NO", then may proceed with Cephalosporin use.    Propoxyphene Hcl    Sulfa Antibiotics Other (See Comments)    Swollen lip and tongue Blisters on tongue   Sulfamethoxazole-Trimethoprim Other (See Comments)   Thimerosal (Thiomersal) Other (See Comments)    Eye burning    Family History: Family History  Problem Relation Age of Onset   Breast cancer Mother    Alcohol abuse Father    Pneumonia Father    Bipolar disorder Son    Coronary artery disease Neg Hx    Diabetes Neg Hx    Hypertension Neg Hx    Colon cancer Neg Hx    Prostate cancer Neg Hx    Kidney cancer Neg Hx     Social History:  reports that she has quit smoking. She has never used smokeless tobacco. She reports that she does not drink alcohol and does not use drugs.  ROS:                                        Physical Exam: BP 135/76   Pulse 93   Ht '5\' 5"'$  (1.651 m)   Wt 54.4 kg   BMI 19.97 kg/m   Constitutional:  Alert and oriented, No acute distress. HEENT: [place] AT, moist mucus membranes.  Trachea midline, no masses.  Laboratory  Data: Lab Results  Component Value Date   WBC 7.9 12/23/2019   HGB 9.6 (L) 12/23/2019   HCT 29.0  (L) 12/23/2019   MCV 91.2 12/23/2019   PLT 219 12/23/2019    Lab Results  Component Value Date   CREATININE 0.55 12/21/2019    No results found for: "PSA"  No results found for: "TESTOSTERONE"  No results found for: "HGBA1C"  Urinalysis    Component Value Date/Time   COLORURINE GREEN (A) 12/19/2019 1351   APPEARANCEUR HAZY (A) 12/19/2019 1351   LABSPEC 1.016 12/19/2019 1351   PHURINE 6.0 12/19/2019 1351   GLUCOSEU NEGATIVE 12/19/2019 1351   HGBUR NEGATIVE 12/19/2019 1351   BILIRUBINUR NEGATIVE 12/19/2019 1351   KETONESUR 5 (A) 12/19/2019 1351   PROTEINUR NEGATIVE 12/19/2019 1351   NITRITE NEGATIVE 12/19/2019 1351   LEUKOCYTESUR NEGATIVE 12/19/2019 1351    Pertinent Imaging:   Assessment & Plan: Send urine for culture.  Unfortunately am not aware of a pill for burning that comes as a syrup or something else that she may be able to swallow better.  I have asked her daughter to speak to a local pharmacy.  Prescription for Uribel syrup twice a day and if this fails Pyridium which is a small tablet twice a day and her daughter agreed.  I will also speak to local pharmacy.  I will call if urine culture is positive  1. Urge incontinence  - Urinalysis, Complete   No follow-ups on file.  Reece Packer, MD  [place] [address] [place], [place] [address] [phone]

## 2022-07-06 ENCOUNTER — Telehealth: Payer: Self-pay | Admitting: Urology

## 2022-07-06 DIAGNOSIS — R2681 Unsteadiness on feet: Secondary | ICD-10-CM | POA: Diagnosis not present

## 2022-07-06 DIAGNOSIS — R2689 Other abnormalities of gait and mobility: Secondary | ICD-10-CM | POA: Diagnosis not present

## 2022-07-06 DIAGNOSIS — M6281 Muscle weakness (generalized): Secondary | ICD-10-CM | POA: Diagnosis not present

## 2022-07-06 NOTE — Telephone Encounter (Signed)
Kaitlyn Church [phone] from pt's facility needs to clarify how long pt should try Uribel syrup and if doesn't work change to Peridium?

## 2022-07-06 NOTE — Telephone Encounter (Signed)
Talked to [facility] and they are going to call back if they can found there uribel syrup . If not they are going to start her on the peridium for 30 days.

## 2022-07-08 LAB — CULTURE, URINE COMPREHENSIVE

## 2022-07-12 ENCOUNTER — Telehealth: Payer: Self-pay | Admitting: *Deleted

## 2022-07-12 MED ORDER — CIPROFLOXACIN HCL 250 MG PO TABS: 250.0000 mg | ORAL_TABLET | Freq: Two times a day (BID) | ORAL | 0 refills | Status: DC

## 2022-07-12 NOTE — Telephone Encounter (Signed)
-----   Message from Bjorn Loser, MD sent at 07/12/2022  8:21 AM EST ----- Ciprofloxacin 250 mg twice a day for 7 days ----- Message ----- From: Despina Hidden, CMA Sent: 07/08/2022   9:44 AM EST To: Bjorn Loser, MD   ----- Message ----- From: Interface, [facility] Lab Results In Sent: 07/05/2022   4:36 PM EST To: [facility]

## 2022-07-12 NOTE — Telephone Encounter (Signed)
Spoke with emergency contacts and sent rx to [facility]

## 2022-07-14 NOTE — Telephone Encounter (Signed)
RX is for the CIPRO

## 2022-07-14 NOTE — Telephone Encounter (Signed)
[name] has not received a Careers information officer for [employer].  Here is the fax number [phone] attn: Nurse for Kaitlyn Church

## 2022-07-16 DIAGNOSIS — F331 Major depressive disorder, recurrent, moderate: Secondary | ICD-10-CM | POA: Diagnosis not present

## 2022-07-16 DIAGNOSIS — F419 Anxiety disorder, unspecified: Secondary | ICD-10-CM | POA: Diagnosis not present

## 2022-07-23 DIAGNOSIS — K581 Irritable bowel syndrome with constipation: Secondary | ICD-10-CM | POA: Diagnosis not present

## 2022-07-23 DIAGNOSIS — G20B2 Parkinson's disease with dyskinesia, with fluctuations: Secondary | ICD-10-CM | POA: Diagnosis not present

## 2022-07-23 DIAGNOSIS — K219 Gastro-esophageal reflux disease without esophagitis: Secondary | ICD-10-CM | POA: Diagnosis not present

## 2022-07-28 ENCOUNTER — Telehealth: Payer: Self-pay | Admitting: Urology

## 2022-07-28 MED ORDER — CIPROFLOXACIN HCL 250 MG PO TABS: 250.0000 mg | ORAL_TABLET | Freq: Two times a day (BID) | ORAL | 0 refills | Status: AC

## 2022-07-28 NOTE — Telephone Encounter (Signed)
rx sent to pharmacy by e-script Spoke with patient emergency contact and advised results

## 2022-07-28 NOTE — Telephone Encounter (Signed)
Kaitlyn Church [facility]) called to let you know that pharmacy is [facility]. Their phone number is [phone]. She said this is where Cipro prescription should be sent.

## 2022-08-06 DIAGNOSIS — F419 Anxiety disorder, unspecified: Secondary | ICD-10-CM | POA: Diagnosis not present

## 2022-08-06 DIAGNOSIS — F331 Major depressive disorder, recurrent, moderate: Secondary | ICD-10-CM | POA: Diagnosis not present

## 2022-08-10 DIAGNOSIS — M3500 Sicca syndrome, unspecified: Secondary | ICD-10-CM | POA: Diagnosis not present

## 2022-08-10 DIAGNOSIS — K581 Irritable bowel syndrome with constipation: Secondary | ICD-10-CM | POA: Diagnosis not present

## 2022-08-10 DIAGNOSIS — K219 Gastro-esophageal reflux disease without esophagitis: Secondary | ICD-10-CM | POA: Diagnosis not present

## 2022-08-10 DIAGNOSIS — G20B2 Parkinson's disease with dyskinesia, with fluctuations: Secondary | ICD-10-CM | POA: Diagnosis not present

## 2022-08-10 DIAGNOSIS — M6281 Muscle weakness (generalized): Secondary | ICD-10-CM | POA: Diagnosis not present

## 2022-08-24 ENCOUNTER — Telehealth: Payer: Self-pay | Admitting: *Deleted

## 2022-08-24 NOTE — Patient Outreach (Signed)
  Care Coordination   Initial Visit Note   08/24/2022 Name: Kaitlyn Church MRN: [id number] DOB: February 13, 1941  Kaitlyn Church is a 81 y.o. year old female who sees Kirk Ruths, MD for primary care. I  [facility] to confirm patient is currently a resident.  She is there for long term care.  Will not open case.    SDOH assessments and interventions completed:  No     Care Coordination Interventions:  No, not indicated   Follow up plan: No further intervention required.   Encounter Outcome:  Pt. Visit Completed   Valente David, RN, MSN, [facility] Care Management Care Management Coordinator [phone]

## 2022-08-26 ENCOUNTER — Ambulatory Visit (INDEPENDENT_AMBULATORY_CARE_PROVIDER_SITE_OTHER): Payer: PPO | Admitting: Podiatry

## 2022-08-26 ENCOUNTER — Encounter: Payer: Self-pay | Admitting: Podiatry

## 2022-08-26 DIAGNOSIS — B351 Tinea unguium: Secondary | ICD-10-CM | POA: Diagnosis not present

## 2022-08-26 DIAGNOSIS — M79674 Pain in right toe(s): Secondary | ICD-10-CM | POA: Diagnosis not present

## 2022-08-26 DIAGNOSIS — M79675 Pain in left toe(s): Secondary | ICD-10-CM | POA: Diagnosis not present

## 2022-08-26 NOTE — Progress Notes (Signed)
This patient returns to the office for evaluation and treatment of long thick painful nails .  This patient is unable to trim his own nails since the patient cannot reach his feet.  Patient says the nails are painful walking and wearing his shoes.  He returns for preventive foot care services.  General Appearance  Alert, conversant and in no acute stress.  Vascular  Dorsalis pedis and posterior tibial  pulses are palpable  bilaterally.  Capillary return is within normal limits  bilaterally. Temperature is within normal limits  bilaterally.  Neurologic  Senn-Weinstein monofilament wire test within normal limits  bilaterally. Muscle power within normal limits bilaterally.  Nails Thick disfigured discolored nails with subungual debris  from hallux to fifth toes bilaterally. No evidence of bacterial infection or drainage bilaterally.  Orthopedic  No limitations of motion  feet .  No crepitus or effusions noted.  No bony pathology or digital deformities noted.  Pes cavus.  Skin  normotropic skin with no porokeratosis noted bilaterally.  No signs of infections or ulcers noted.     Onychomycosis  Pain in toes right foot  Pain in toes left foot  Debridement  of nails  1-5  B/L with a nail nipper.  Nails were then filed using a dremel tool with no incidents.    RTC 4 months   Gardiner Barefoot DPM

## 2022-10-19 DIAGNOSIS — M79642 Pain in left hand: Secondary | ICD-10-CM | POA: Diagnosis not present

## 2022-12-16 ENCOUNTER — Ambulatory Visit: Payer: PPO | Admitting: Podiatry

## 2023-01-11 DIAGNOSIS — H04123 Dry eye syndrome of bilateral lacrimal glands: Secondary | ICD-10-CM | POA: Diagnosis not present

## 2023-01-11 DIAGNOSIS — H01009 Unspecified blepharitis unspecified eye, unspecified eyelid: Secondary | ICD-10-CM | POA: Diagnosis not present

## 2023-01-11 DIAGNOSIS — H524 Presbyopia: Secondary | ICD-10-CM | POA: Diagnosis not present

## 2023-01-26 DIAGNOSIS — F419 Anxiety disorder, unspecified: Secondary | ICD-10-CM | POA: Diagnosis not present

## 2023-01-26 DIAGNOSIS — R5382 Chronic fatigue, unspecified: Secondary | ICD-10-CM | POA: Diagnosis not present

## 2023-02-15 DIAGNOSIS — M3500 Sicca syndrome, unspecified: Secondary | ICD-10-CM | POA: Diagnosis not present

## 2023-02-15 DIAGNOSIS — G20B2 Parkinson's disease with dyskinesia, with fluctuations: Secondary | ICD-10-CM | POA: Diagnosis not present

## 2023-02-15 DIAGNOSIS — K589 Irritable bowel syndrome without diarrhea: Secondary | ICD-10-CM | POA: Diagnosis not present

## 2023-02-15 DIAGNOSIS — K219 Gastro-esophageal reflux disease without esophagitis: Secondary | ICD-10-CM | POA: Diagnosis not present

## 2023-02-15 DIAGNOSIS — M6281 Muscle weakness (generalized): Secondary | ICD-10-CM | POA: Diagnosis not present

## 2023-02-25 DIAGNOSIS — K5900 Constipation, unspecified: Secondary | ICD-10-CM | POA: Diagnosis not present

## 2023-02-25 DIAGNOSIS — K219 Gastro-esophageal reflux disease without esophagitis: Secondary | ICD-10-CM | POA: Diagnosis not present

## 2023-02-25 DIAGNOSIS — Z515 Encounter for palliative care: Secondary | ICD-10-CM | POA: Diagnosis not present

## 2023-02-25 DIAGNOSIS — M6259 Muscle wasting and atrophy, not elsewhere classified, multiple sites: Secondary | ICD-10-CM | POA: Diagnosis not present

## 2023-02-25 DIAGNOSIS — G20B2 Parkinson's disease with dyskinesia, with fluctuations: Secondary | ICD-10-CM | POA: Diagnosis not present

## 2023-02-25 DIAGNOSIS — M6249 Contracture of muscle, multiple sites: Secondary | ICD-10-CM | POA: Diagnosis not present

## 2023-03-14 DIAGNOSIS — R0602 Shortness of breath: Secondary | ICD-10-CM | POA: Diagnosis not present

## 2023-03-31 DEATH — deceased

## 2024-03-15 ENCOUNTER — Other Ambulatory Visit: Payer: Self-pay
